# Patient Record
Sex: Female | Born: 1995 | Race: Black or African American | Hispanic: No | Marital: Single | Smoking: Never smoker
Health system: Southern US, Community
[De-identification: ages and names within clinical notes are randomized; demographics above are authoritative.]

## PROBLEM LIST (undated history)

## (undated) DIAGNOSIS — R109 Unspecified abdominal pain: Secondary | ICD-10-CM

## (undated) DIAGNOSIS — G8929 Other chronic pain: Secondary | ICD-10-CM

## (undated) DIAGNOSIS — J9600 Acute respiratory failure, unspecified whether with hypoxia or hypercapnia: Secondary | ICD-10-CM

## (undated) DIAGNOSIS — M25569 Pain in unspecified knee: Secondary | ICD-10-CM

## (undated) HISTORY — PX: NO PAST SURGERIES: SHX2092

## (undated) HISTORY — DX: Acute respiratory failure, unspecified whether with hypoxia or hypercapnia: J96.00

## (undated) HISTORY — PX: OTHER SURGICAL HISTORY: SHX169

---
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
## 2014-01-02 ENCOUNTER — Emergency Department (HOSPITAL_COMMUNITY): Payer: Medicaid Other

## 2014-01-02 ENCOUNTER — Emergency Department (HOSPITAL_COMMUNITY)
Admission: EM | Admit: 2014-01-02 | Discharge: 2014-01-03 | Disposition: A | Discharge status: Home / Self Care | Payer: Medicaid Other | Attending: Emergency Medicine | Admitting: Emergency Medicine

## 2014-01-02 ENCOUNTER — Encounter (HOSPITAL_COMMUNITY): Payer: Self-pay | Admitting: Emergency Medicine

## 2014-01-02 DIAGNOSIS — S99919A Unspecified injury of unspecified ankle, initial encounter: Principal | ICD-10-CM

## 2014-01-02 DIAGNOSIS — X500XXA Overexertion from strenuous movement or load, initial encounter: Secondary | ICD-10-CM | POA: Insufficient documentation

## 2014-01-02 DIAGNOSIS — Y929 Unspecified place or not applicable: Secondary | ICD-10-CM | POA: Insufficient documentation

## 2014-01-02 DIAGNOSIS — S99929A Unspecified injury of unspecified foot, initial encounter: Principal | ICD-10-CM

## 2014-01-02 DIAGNOSIS — F17200 Nicotine dependence, unspecified, uncomplicated: Secondary | ICD-10-CM | POA: Insufficient documentation

## 2014-01-02 DIAGNOSIS — M25461 Effusion, right knee: Secondary | ICD-10-CM

## 2014-01-02 DIAGNOSIS — IMO0002 Reserved for concepts with insufficient information to code with codable children: Secondary | ICD-10-CM | POA: Insufficient documentation

## 2014-01-02 DIAGNOSIS — L309 Dermatitis, unspecified: Secondary | ICD-10-CM

## 2014-01-02 DIAGNOSIS — L259 Unspecified contact dermatitis, unspecified cause: Secondary | ICD-10-CM | POA: Insufficient documentation

## 2014-01-02 DIAGNOSIS — Y9301 Activity, walking, marching and hiking: Secondary | ICD-10-CM | POA: Insufficient documentation

## 2014-01-02 DIAGNOSIS — Z79899 Other long term (current) drug therapy: Secondary | ICD-10-CM | POA: Insufficient documentation

## 2014-01-02 DIAGNOSIS — S8990XA Unspecified injury of unspecified lower leg, initial encounter: Secondary | ICD-10-CM | POA: Insufficient documentation

## 2014-01-02 DIAGNOSIS — J45909 Unspecified asthma, uncomplicated: Secondary | ICD-10-CM | POA: Insufficient documentation

## 2014-01-02 MED ORDER — HYDROCODONE-ACETAMINOPHEN 5-325 MG PO TABS
1.0000 | ORAL_TABLET | Freq: Once | ORAL | Status: AC
  Administered 2014-01-02: 1 via ORAL
  Filled 2014-01-02: qty 1

## 2014-01-02 MED ORDER — IBUPROFEN 800 MG PO TABS
800.0000 mg | ORAL_TABLET | Freq: Once | ORAL | Status: AC
  Administered 2014-01-02: 800 mg via ORAL
  Filled 2014-01-02: qty 1

## 2014-01-02 NOTE — ED Notes (Signed)
PA at bedside.

## 2014-01-02 NOTE — ED Provider Notes (Signed)
CSN: [id number]     Arrival date & time 01/02/14  2313 History   First MD Initiated Contact with Patient 01/02/14 2316     Chief Complaint  Patient presents with  . Knee Pain     (Consider location/radiation/quality/duration/timing/severity/associated sxs/prior Treatment) HPI Comments: Patient is a 18 year old female who presents to the emergency department for complaint of right knee pain. The patient states that she has had problems with her knee" popping out" in the past, but this has not happened for several months. Today approximately 4:30 PM she was walking when the knee" popped out" again. She states that the pain lasted longer this time, and she also noted more swelling time than usual. The patient has not had any injury or trauma to the knee. She has not been evaluated by orthopedics concerning the reason why her knee" pops out". She presents now for evaluation of this problem.  Patient is a 18 y.o. female presenting with knee pain. The history is provided by the patient.  Knee Pain Location:  Knee Associated symptoms: no back pain and no neck pain     Past Medical History  Diagnosis Date  . Asthma    History reviewed. No pertinent past surgical history. Family History  Problem Relation Age of Onset  . Asthma Sister   . Diabetes Maternal Grandmother   . Hypertension Maternal Grandmother   . Diabetes Maternal Grandfather   . Asthma Maternal Grandfather    History  Substance Use Topics  . Smoking status: Current Every Day Smoker  . Smokeless tobacco: Not on file  . Alcohol Use: No   OB History   Grav Para Term Preterm Abortions TAB SAB Ect Mult Living                 Review of Systems  Constitutional: Negative for activity change.       All ROS Neg except as noted in HPI  HENT: Negative for nosebleeds.   Eyes: Negative for photophobia and discharge.  Respiratory: Negative for cough, shortness of breath and wheezing.   Cardiovascular: Negative for chest pain and  palpitations.  Gastrointestinal: Negative for abdominal pain and blood in stool.  Genitourinary: Negative for dysuria, frequency and hematuria.  Musculoskeletal: Positive for arthralgias. Negative for back pain and neck pain.  Skin: Positive for rash.  Neurological: Negative for dizziness, seizures and speech difficulty.  Psychiatric/Behavioral: Negative for hallucinations and confusion.      Allergies  Shrimp  Home Medications   Prior to Admission medications   Medication Sig Start Date End Date Taking? Authorizing Provider  acetaminophen (TYLENOL) 500 MG tablet Take 500 mg by mouth every 6 (six) hours as needed.    Historical Provider, MD  albuterol (PROVENTIL HFA;VENTOLIN HFA) 108 (90 BASE) MCG/ACT inhaler Inhale 2 puffs into the lungs every 6 (six) hours as needed. For shortness of breath     Historical Provider, MD  albuterol (PROVENTIL) (2.5 MG/3ML) 0.083% nebulizer solution Take 2.5 mg by nebulization every 6 (six) hours as needed. For shortness of breath     Historical Provider, MD  beclomethasone (QVAR) 80 MCG/ACT inhaler Inhale 1 puff into the lungs 2 (two) times daily. 07/09/12   Leona SingletonMaria T Thekkekandam, MD  etonogestrel (IMPLANON) 68 MG IMPL implant Inject 1 each into the skin once.    Historical Provider, MD  fluticasone (FLONASE) 50 MCG/ACT nasal spray Place 2 sprays into the nose daily as needed. Alergies    Historical Provider, MD  loratadine (CLARITIN) 10 MG tablet  Take 10 mg by mouth daily.    Historical Provider, MD  ondansetron (ZOFRAN) 4 MG tablet Take 1 tablet (4 mg total) by mouth every 8 (eight) hours as needed for nausea or vomiting. 08/26/13   Laray AngerKathleen M McManus, DO   BP 115/75  Pulse 89  Temp(Src) 98.5 F (36.9 C) (Oral)  Resp 18  Ht 5\' 8"  (1.727 m)  Wt 147 lb (66.679 kg)  BMI 22.36 kg/m2  SpO2 97% Physical Exam  Nursing note and vitals reviewed. Constitutional: She is oriented to person, place, and time. She appears well-developed and well-nourished.   Non-toxic appearance.  HENT:  Head: Normocephalic.  Right Ear: Tympanic membrane and external ear normal.  Left Ear: Tympanic membrane and external ear normal.  Eyes: EOM and lids are normal. Pupils are equal, round, and reactive to light.  Neck: Normal range of motion. Neck supple. Carotid bruit is not present.  Cardiovascular: Normal rate, regular rhythm, normal heart sounds, intact distal pulses and normal pulses.   Pulmonary/Chest: Breath sounds normal. No respiratory distress.  Abdominal: Soft. Bowel sounds are normal. There is no tenderness. There is no guarding.  Musculoskeletal:       Right knee: She exhibits decreased range of motion and effusion. She exhibits no deformity and no erythema. Tenderness found. No patellar tendon tenderness noted.  Patient would not cooperate for examination of laxity of the joint or ligaments.  Lymphadenopathy:       Head (right side): No submandibular adenopathy present.       Head (left side): No submandibular adenopathy present.    She has no cervical adenopathy.  Neurological: She is alert and oriented to person, place, and time. She has normal strength. No cranial nerve deficit or sensory deficit.  Skin: Skin is warm and dry. Rash noted.  Red splotches on the wrists and legs. No red streaking.  Psychiatric: She has a normal mood and affect. Her speech is normal.    ED Course  Procedures (including critical care time) Labs Review Labs Reviewed - No data to display  Imaging Review No results found.   EKG Interpretation None      MDM Patient states she's had problems with her knee popping out from time to time. Today it popped out and the pain was more severe and the swelling was more than usual. The vital signs are well within normal limits. The pulse oximetry is 97% on room air. Within normal limits by my interpretation.   Xray is negative for fx. Small joint effusion noted.  Pt has a splotching rash on the wrist and legs. She  recently changed soaps. Suggested to pt to stop the soap and use triamcinolone cream.  Knee immobilizer and crutches given to the patient. Pt to see orthopedics for possible patella dislocation problem.   Final diagnoses:  None    **I have reviewed nursing notes, vital signs, and all appropriate lab and imaging results for this patient.Kathie Dike*    Aaren Atallah M Daley Gosse, PA-C 01/03/14 [phone]

## 2014-01-02 NOTE — ED Notes (Signed)
Patient states her right knee "popped out" today and she put it back in; states this has happened before.  Patient also has a rash; states she recently changed soap.  Patient ambulatory.

## 2014-01-03 MED ORDER — IBUPROFEN 800 MG PO TABS: 800.0000 mg | ORAL_TABLET | Freq: Three times a day (TID) | ORAL | Status: DC

## 2014-01-03 MED ORDER — TRIAMCINOLONE ACETONIDE 0.1 % EX CREA: 1.0000 "application " | TOPICAL_CREAM | Freq: Two times a day (BID) | CUTANEOUS | Status: DC

## 2014-01-03 MED ORDER — TRAMADOL HCL 50 MG PO TABS: ORAL_TABLET | ORAL | Status: DC

## 2014-01-03 NOTE — Discharge Instructions (Signed)
Your xray is negative for fracture, but suggest fluid in the joint (effusion). You may have a weak muscle on the side of your knee that is no holding your patella steady. It is important that you use the knee immobilizer until seen by orthopedics. Please apply ice for the next 3 days. Use ibuprofen daily with food. Use ultram for pain. This may cause drowsiness, use with caution. Please see the orthopedic MD listed above, or the orthopedic MD of your choice as soon as possible. Knee Effusion  Knee effusion means you have fluid in your knee. The knee may be more difficult to bend and move. HOME CARE  Use crutches or a brace as told by your doctor.  Put ice on the injured area.  Put ice in a plastic bag.  Place a towel between your skin and the bag.  Leave the ice on for 15-20 minutes, 03-04 times a day.  Raise (elevate) your knee as much as possible.  Only take medicine as told by your doctor.  You may need to do strengthening exercises. Ask your doctor.  Continue with your normal diet and activities as told by your doctor. GET HELP RIGHT AWAY IF:  You have more puffiness (swelling) in your knee.  You see redness, puffiness, or have more pain in your knee.  You have a temperature by mouth above 102 F (38.9 C).  You get a rash.  You have trouble breathing.  You have a reaction to any medicine you are taking.  You have a lot of pain when you move your knee. MAKE SURE YOU:  Understand these instructions.  Will watch your condition.  Will get help right away if you are not doing well or get worse. Document Released: 09/28/2010 Document Revised: 11/18/2011 Document Reviewed: 09/28/2010 [facility] Patient Information 2014 [employer], [place].

## 2014-01-03 NOTE — ED Provider Notes (Signed)
Medical screening examination/treatment/procedure(s) were performed by non-physician practitioner and as supervising physician I was immediately available for consultation/collaboration.    Vida RollerBrian D Shantel Helwig, MD 01/03/14 [phone]

## 2014-04-09 ENCOUNTER — Encounter: Payer: Self-pay | Admitting: *Deleted

## 2014-05-03 ENCOUNTER — Encounter: Payer: Self-pay | Admitting: Advanced Practice Midwife

## 2014-05-03 ENCOUNTER — Encounter: Payer: Self-pay | Admitting: *Deleted

## 2014-12-19 ENCOUNTER — Emergency Department (HOSPITAL_COMMUNITY): Payer: Self-pay

## 2014-12-19 ENCOUNTER — Emergency Department (HOSPITAL_COMMUNITY)
Admission: EM | Admit: 2014-12-19 | Discharge: 2014-12-19 | Disposition: A | Discharge status: Home / Self Care | Payer: Self-pay | Attending: Emergency Medicine | Admitting: Emergency Medicine

## 2014-12-19 ENCOUNTER — Encounter (HOSPITAL_COMMUNITY): Payer: Self-pay | Admitting: Emergency Medicine

## 2014-12-19 DIAGNOSIS — J45909 Unspecified asthma, uncomplicated: Secondary | ICD-10-CM | POA: Insufficient documentation

## 2014-12-19 DIAGNOSIS — Z79899 Other long term (current) drug therapy: Secondary | ICD-10-CM | POA: Insufficient documentation

## 2014-12-19 DIAGNOSIS — R109 Unspecified abdominal pain: Secondary | ICD-10-CM

## 2014-12-19 DIAGNOSIS — Z3202 Encounter for pregnancy test, result negative: Secondary | ICD-10-CM | POA: Insufficient documentation

## 2014-12-19 DIAGNOSIS — Z7952 Long term (current) use of systemic steroids: Secondary | ICD-10-CM | POA: Insufficient documentation

## 2014-12-19 DIAGNOSIS — G8929 Other chronic pain: Secondary | ICD-10-CM | POA: Insufficient documentation

## 2014-12-19 DIAGNOSIS — N390 Urinary tract infection, site not specified: Secondary | ICD-10-CM | POA: Insufficient documentation

## 2014-12-19 HISTORY — DX: Other chronic pain: G89.29

## 2014-12-19 HISTORY — DX: Pain in unspecified knee: M25.569

## 2014-12-19 HISTORY — DX: Unspecified abdominal pain: R10.9

## 2014-12-19 LAB — CBC WITH DIFFERENTIAL/PLATELET
Basophils Absolute: 0 10*3/uL (ref 0.0–0.1)
Basophils Relative: 0 % (ref 0–1)
EOS PCT: 3 % (ref 0–5)
Eosinophils Absolute: 0.2 10*3/uL (ref 0.0–0.7)
HCT: 41.4 % (ref 36.0–46.0)
HEMOGLOBIN: 13.5 g/dL (ref 12.0–15.0)
LYMPHS PCT: 25 % (ref 12–46)
Lymphs Abs: 1.4 10*3/uL (ref 0.7–4.0)
MCH: 29.5 pg (ref 26.0–34.0)
MCHC: 32.6 g/dL (ref 30.0–36.0)
MCV: 90.6 fL (ref 78.0–100.0)
Monocytes Absolute: 0.4 10*3/uL (ref 0.1–1.0)
Monocytes Relative: 7 % (ref 3–12)
NEUTROS ABS: 3.8 10*3/uL (ref 1.7–7.7)
Neutrophils Relative %: 65 % (ref 43–77)
PLATELETS: 396 10*3/uL (ref 150–400)
RBC: 4.57 MIL/uL (ref 3.87–5.11)
RDW: 12.3 % (ref 11.5–15.5)
WBC: 5.8 10*3/uL (ref 4.0–10.5)

## 2014-12-19 LAB — COMPREHENSIVE METABOLIC PANEL
ALT: 14 U/L (ref 0–35)
ANION GAP: 10 (ref 5–15)
AST: 20 U/L (ref 0–37)
Albumin: 4 g/dL (ref 3.5–5.2)
Alkaline Phosphatase: 61 U/L (ref 39–117)
BUN: 11 mg/dL (ref 6–23)
CO2: 23 mmol/L (ref 19–32)
CREATININE: 0.77 mg/dL (ref 0.50–1.10)
Calcium: 9.5 mg/dL (ref 8.4–10.5)
Chloride: 106 mmol/L (ref 96–112)
GLUCOSE: 103 mg/dL — AB (ref 70–99)
Potassium: 4.1 mmol/L (ref 3.5–5.1)
SODIUM: 139 mmol/L (ref 135–145)
TOTAL PROTEIN: 8.1 g/dL (ref 6.0–8.3)
Total Bilirubin: 0.8 mg/dL (ref 0.3–1.2)

## 2014-12-19 LAB — URINALYSIS, ROUTINE W REFLEX MICROSCOPIC
Bilirubin Urine: NEGATIVE
Glucose, UA: NEGATIVE mg/dL
KETONES UR: NEGATIVE mg/dL
Leukocytes, UA: NEGATIVE
Nitrite: POSITIVE — AB
PH: 6 (ref 5.0–8.0)
Protein, ur: NEGATIVE mg/dL
Specific Gravity, Urine: >1.03 — ABNORMAL HIGH (ref 1.005–1.030)
Urobilinogen, UA: 0.2 mg/dL (ref 0.0–1.0)

## 2014-12-19 LAB — LIPASE, BLOOD: LIPASE: 16 U/L (ref 11–59)

## 2014-12-19 LAB — URINE MICROSCOPIC-ADD ON

## 2014-12-19 LAB — PREGNANCY, URINE: Preg Test, Ur: NEGATIVE

## 2014-12-19 MED ORDER — FAMOTIDINE 20 MG PO TABS
40.0000 mg | ORAL_TABLET | Freq: Once | ORAL | Status: AC
  Administered 2014-12-19: 40 mg via ORAL
  Filled 2014-12-19: qty 2

## 2014-12-19 MED ORDER — GI COCKTAIL ~~LOC~~
30.0000 mL | Freq: Once | ORAL | Status: AC
  Administered 2014-12-19: 30 mL via ORAL
  Filled 2014-12-19: qty 30

## 2014-12-19 MED ORDER — CEPHALEXIN 500 MG PO CAPS
500.0000 mg | ORAL_CAPSULE | Freq: Once | ORAL | Status: AC
  Administered 2014-12-19: 500 mg via ORAL
  Filled 2014-12-19: qty 1

## 2014-12-19 MED ORDER — CEPHALEXIN 500 MG PO CAPS: 500.0000 mg | ORAL_CAPSULE | Freq: Four times a day (QID) | ORAL | Status: DC

## 2014-12-19 NOTE — ED Provider Notes (Signed)
CSN: [id number]     Arrival date & time 12/19/14  1603 History   First MD Initiated Contact with Patient 12/19/14 1619     Chief Complaint  Patient presents with  . Abdominal Pain      HPI  Pt was seen at 1630.  Per pt, c/o gradual onset and persistence of constant upper abd "pain" for the past 2 to 3 months.  Has been associated with multiple intermittent episodes of N/V, with "blood in it sometimes." Describes the abd pain as "stabbing," "shooting," and "aching." States she is also unsure if she is pregnant and is requesting a pregnancy test. Pt states she has told her PMD regarding her symptoms and "he just said it was from coughing or maybe it was my stomach." Pt states she has not followed up "because I don't have insurance." Denies diarrhea, no fevers, no back pain, no rash, no CP/SOB, no black or blood in stools, no coffee-ground emesis.    Past Medical History  Diagnosis Date  . Asthma   . Respiratory failure, acute 06/2012    bipap only, not intubated  . Chronic abdominal pain   . Chronic knee pain    History reviewed. No pertinent past surgical history.   Family History  Problem Relation Age of Onset  . Asthma Sister   . Diabetes Maternal Grandmother   . Hypertension Maternal Grandmother   . Diabetes Maternal Grandfather   . Asthma Maternal Grandfather    History  Substance Use Topics  . Smoking status: Never Smoker   . Smokeless tobacco: Not on file  . Alcohol Use: No    Review of Systems ROS: Statement: All systems negative except as marked or noted in the HPI; Constitutional: Negative for fever and chills. ; ; Eyes: Negative for eye pain, redness and discharge. ; ; ENMT: Negative for ear pain, hoarseness, nasal congestion, sinus pressure and sore throat. ; ; Cardiovascular: Negative for chest pain, palpitations, diaphoresis, dyspnea and peripheral edema. ; ; Respiratory: Negative for cough, wheezing and stridor. ; ; Gastrointestinal: +N/V, abd pain. Negative for  diarrhea, blood in stool, jaundice and rectal bleeding. . ; ; Genitourinary: Negative for dysuria, flank pain and hematuria. ; ; Musculoskeletal: Negative for back pain and neck pain. Negative for swelling and trauma.; ; Skin: Negative for pruritus, rash, abrasions, blisters, bruising and skin lesion.; ; Neuro: Negative for headache, lightheadedness and neck stiffness. Negative for weakness, altered level of consciousness , altered mental status, extremity weakness, paresthesias, involuntary movement, seizure and syncope.     Allergies  Shrimp  Home Medications   Prior to Admission medications   Medication Sig Start Date End Date Taking? Authorizing Provider  acetaminophen (TYLENOL) 500 MG tablet Take 500 mg by mouth every 6 (six) hours as needed.    Historical Provider, MD  albuterol (PROVENTIL HFA;VENTOLIN HFA) 108 (90 BASE) MCG/ACT inhaler Inhale 2 puffs into the lungs every 6 (six) hours as needed. For shortness of breath     Historical Provider, MD  albuterol (PROVENTIL) (2.5 MG/3ML) 0.083% nebulizer solution Take 2.5 mg by nebulization every 6 (six) hours as needed. For shortness of breath     Historical Provider, MD  beclomethasone (QVAR) 80 MCG/ACT inhaler Inhale 1 puff into the lungs 2 (two) times daily. 07/09/12   Leona SingletonMaria T Thekkekandam, MD  etonogestrel (IMPLANON) 68 MG IMPL implant Inject 1 each into the skin once.    Historical Provider, MD  fluticasone (FLONASE) 50 MCG/ACT nasal spray Place 2 sprays into the  nose daily as needed. Alergies    Historical Provider, MD  ibuprofen (ADVIL,MOTRIN) 800 MG tablet Take 1 tablet (800 mg total) by mouth 3 (three) times daily. 01/03/14   Ivery Quale, PA-C  loratadine (CLARITIN) 10 MG tablet Take 10 mg by mouth daily.    Historical Provider, MD  ondansetron (ZOFRAN) 4 MG tablet Take 1 tablet (4 mg total) by mouth every 8 (eight) hours as needed for nausea or vomiting. 08/26/13   Samuel Jester, DO  traMADol Janean Sark) 50 MG tablet 1 or 2 q6h prn  pain, take with food 01/03/14   Ivery Quale, PA-C  triamcinolone cream (KENALOG) 0.1 % Apply 1 application topically 2 (two) times daily. Please apply to affected area 3 times daily. 01/03/14   Ivery Quale, PA-C   BP 145/89 mmHg  Pulse 92  Temp(Src) 98.9 F (37.2 C) (Oral)  Resp 18  Ht  (1.727 m)  Wt 134 lb (60.782 kg)  BMI 20.38 kg/m2  SpO2 100% Physical Exam  1635: Physical examination:  Nursing notes reviewed; Vital signs and O2 SAT reviewed;  Constitutional: Well developed, Well nourished, Well hydrated, In no acute distress; Head:  Normocephalic, atraumatic; Eyes: EOMI, PERRL, No scleral icterus; ENMT: Mouth and pharynx normal, Mucous membranes moist; Neck: Supple, Full range of motion, No lymphadenopathy; Cardiovascular: Regular rate and rhythm, No murmur, rub, or gallop; Respiratory: Breath sounds clear & equal bilaterally, No rales, rhonchi, wheezes.  Speaking full sentences with ease, Normal respiratory effort/excursion; Chest: Nontender, Movement normal; Abdomen: Soft, +mid-epigastric and LUQ tenderness to palp. No rebound or guarding. Nondistended, Normal bowel sounds; Genitourinary: No CVA tenderness; Extremities: Pulses normal, No tenderness, No edema, No calf edema or asymmetry.; Neuro: AA&Ox3, Major CN grossly intact.  Speech clear. No gross focal motor or sensory deficits in extremities.; Skin: Color normal, Warm, Dry.   ED Course  Procedures     EKG Interpretation None      MDM  MDM Reviewed: previous chart, nursing note and vitals Reviewed previous: labs Interpretation: labs and x-ray     Results for orders placed or performed during the hospital encounter of 12/19/14  Urinalysis, Routine w reflex microscopic  Result Value Ref Range   Color, Urine YELLOW YELLOW   APPearance CLEAR CLEAR   Specific Gravity, Urine >1.030 (H) 1.005 - 1.030   pH 6.0 5.0 - 8.0   Glucose, UA NEGATIVE NEGATIVE mg/dL   Hgb urine dipstick MODERATE (A) NEGATIVE   Bilirubin  Urine NEGATIVE NEGATIVE   Ketones, ur NEGATIVE NEGATIVE mg/dL   Protein, ur NEGATIVE NEGATIVE mg/dL   Urobilinogen, UA 0.2 0.0 - 1.0 mg/dL   Nitrite POSITIVE (A) NEGATIVE   Leukocytes, UA NEGATIVE NEGATIVE  Pregnancy, urine  Result Value Ref Range   Preg Test, Ur NEGATIVE NEGATIVE  Comprehensive metabolic panel  Result Value Ref Range   Sodium 139 135 - 145 mmol/L   Potassium 4.1 3.5 - 5.1 mmol/L   Chloride 106 96 - 112 mmol/L   CO2 23 19 - 32 mmol/L   Glucose, Bld 103 (H) 70 - 99 mg/dL   BUN 11 6 - 23 mg/dL   Creatinine, Ser 1.61 0.50 - 1.10 mg/dL   Calcium 9.5 8.4 - 09.6 mg/dL   Total Protein 8.1 6.0 - 8.3 g/dL   Albumin 4.0 3.5 - 5.2 g/dL   AST 20 0 - 37 U/L   ALT 14 0 - 35 U/L   Alkaline Phosphatase 61 39 - 117 U/L   Total Bilirubin 0.8 0.3 -  1.2 mg/dL   GFR calc non Af Amer >90 >90 mL/min   GFR calc Af Amer >90 >90 mL/min   Anion gap 10 5 - 15  Lipase, blood  Result Value Ref Range   Lipase 16 11 - 59 U/L  CBC with Differential  Result Value Ref Range   WBC 5.8 4.0 - 10.5 K/uL   RBC 4.57 3.87 - 5.11 MIL/uL   Hemoglobin 13.5 12.0 - 15.0 g/dL   HCT 16.1 09.6 - 04.5 %   MCV 90.6 78.0 - 100.0 fL   MCH 29.5 26.0 - 34.0 pg   MCHC 32.6 30.0 - 36.0 g/dL   RDW 40.9 81.1 - 91.4 %   Platelets 396 150 - 400 K/uL   Neutrophils Relative % 65 43 - 77 %   Neutro Abs 3.8 1.7 - 7.7 K/uL   Lymphocytes Relative 25 12 - 46 %   Lymphs Abs 1.4 0.7 - 4.0 K/uL   Monocytes Relative 7 3 - 12 %   Monocytes Absolute 0.4 0.1 - 1.0 K/uL   Eosinophils Relative 3 0 - 5 %   Eosinophils Absolute 0.2 0.0 - 0.7 K/uL   Basophils Relative 0 0 - 1 %   Basophils Absolute 0.0 0.0 - 0.1 K/uL  Urine microscopic-add on  Result Value Ref Range   Squamous Epithelial / LPF RARE RARE   WBC, UA 21-50 <3 WBC/hpf   RBC / HPF 3-6 <3 RBC/hpf   Bacteria, UA MANY (A) RARE   Dg Chest 2 View 12/19/2014   CLINICAL DATA:  Initial encounter for 2 week history of worsening shortness of breath and fever.  EXAM:  CHEST  2 VIEW  COMPARISON:  08/26/2013.  FINDINGS: The heart size and mediastinal contours are within normal limits. Both lungs are clear. The visualized skeletal structures are unremarkable.  IMPRESSION: Normal exam.   Electronically Signed   By: Kennith Center M.D.   On: 12/19/2014 16:46   Dg Abd 2 Views 12/19/2014   CLINICAL DATA:  Left lower quadrant abdominal pain  EXAM: ABDOMEN - 2 VIEW  COMPARISON:  None.  FINDINGS: The bowel gas pattern is normal. There is no evidence of free air. No radio-opaque calculi or other significant radiographic abnormality is seen.  IMPRESSION: Negative.   Electronically Signed   By: Christiana Pellant M.D.   On: 12/19/2014 19:28    2030:  Workup reassuring. Will tx for UTI. Pt states she wants to go home now. Dx and testing d/w pt.  Questions answered.  Verb understanding, agreeable to d/c home with outpt f/u.   Samuel Jester, DO 12/22/14 1650

## 2014-12-19 NOTE — Discharge Instructions (Signed)
°Emergency Department Resource Guide °1) Find a Doctor and Pay Out of Pocket °Although you won't have to find out who is covered by your insurance plan, it is a good idea to ask around and get recommendations. You will then need to call the office and see if the doctor you have chosen will accept you as a new patient and what types of options they offer for patients who are self-pay. Some doctors offer discounts or will set up payment plans for their patients who do not have insurance, but you will need to ask so you aren't surprised when you get to your appointment. ° °2) Contact Your Local Health Department °Not all health departments have doctors that can see patients for sick visits, but many do, so it is worth a call to see if yours does. If you don't know where your local health department is, you can check in your phone book. The CDC also has a tool to help you locate your state's health department, and many state websites also have listings of all of their local health departments. ° °3) Find a Walk-in Clinic °If your illness is not likely to be very severe or complicated, you may want to try a walk in clinic. These are popping up all over the country in pharmacies, drugstores, and shopping centers. They're usually staffed by nurse practitioners or physician assistants that have been trained to treat common illnesses and complaints. They're usually fairly quick and inexpensive. However, if you have serious medical issues or chronic medical problems, these are probably not your best option. ° °No Primary Care Doctor: °- Call Health Connect at  832-8000 - they can help you locate a primary care doctor that  accepts your insurance, provides certain services, etc. °- Physician Referral Service- 1-800-533-3463 ° °Chronic Pain Problems: °Organization         Address  Phone   Notes  °[facility]  ([phone]) [phone] Patients need to be referred by their primary care doctor.  ° °Medication  Assistance: °Organization         Address  Phone   Notes  °[place] [facility] [address] °[place], [place] [address] (336) 641-8030 --Must be a resident of [place] °-- Must have NO insurance coverage whatsoever (no Medicaid/ Medicare, etc.) °-- The pt. MUST have a primary care doctor that directs their care regularly and follows them in the community °  °[facility]  (866) 331-1348   °[employer]  (888) 892-1162   ° °Agencies that provide inexpensive medical care: °Organization         Address  Phone   Notes  °[facility]  (336) 832-8035   °[facility]    (336) 832-7272   °[facility] [address] °[place], [place] [address] (336) 832-4777   °[facility] [address], °[place] (336) 271-4999   °[facility]    (336) 373-0678   °[facility]    (336) 272-1050   °[facility] ° [address], [place] Phone:  (336) 832-4444, Fax:  (336) 832-4440 Hours of Operation:  9 am - 6 pm, M-F.  Also accepts Medicaid/Medicare and self-pay.  °[facility] ° [address], [place] Phone: (336) [phone], Fax: (336) [phone]. Hours of Operation:  8:30 am - 5:30 pm, M-F.  Also accepts Medicaid and self-pay.  °[facility] [place] [address], [place] Phone: (336) 878-6027   °[facility] [address], [place], [place] (336)723-1848, Ext. 123 Mondays & Thursdays: 7-9 AM.  First 15 patients are seen on a first come, first serve basis. °  ° °Medicaid-accepting [place] Providers: ° °Organization         Address  Phone   Notes  °[facility] [address], [place] (336) 641-2100 Also accepts self-pay patients.  °[facility] [address], [place] ° (336) 856-9996   °[facility] [address], [place]  (336) 288-8857   °[facility] [address] [place] [address], [place] (336) 299-7000   °Veita Bland [address], [place]  ° (336) 373-1557 Only accepts [place] Access Medicaid patients after they have their name applied to their card.  ° °Self-Pay (no insurance) in [place]: ° °Organization         Address  Phone   Notes  °Sickle Cell Patients, [facility] [address], [place] (336) 832-1970   °[facility] [address], [place] (336) 832-4400   °[facility] ° [address], [place] (336) 992-4800   °[facility]/Dr. Osei-Bonsu ° [address] [place] [address], [place] or [address], [place] (336) [phone] Phone number for both [place] and [place] locations is the same.  °[facility] [address], [place] (336) 299-0000   °[facility] [address] [place] [address], [place] or [address] (336) 852-7530 °(336) 878-2260   °[facility] [address], [place] (336) 350-1642, phone; (336) 294-5005, fax Sees patients 1st and 3rd Saturday of every month.  Must not qualify for public or private insurance (i.e. Medicaid, Medicare, [place] Health Choice, Veterans' Benefits) • Household income should be no more than 200% of the poverty level •The clinic cannot treat you if you are pregnant or think you are pregnant • Sexually transmitted diseases are not treated at the clinic.  ° ° °Dental Care: °Organization         Address  Phone  Notes  °[place] [facility] [address], [place] (336) 641-6152 Accepts children up to age 21 who are enrolled in Medicaid or [place] Health Choice; pregnant women with a Medicaid card; and children who have applied for Medicaid or [place] Health Choice, but were declined, whose parents can pay a reduced fee at time of service.  °[place]  [facility] [place]  [address], [place] (336) 641-7733 Accepts children up to age 21 who are enrolled in Medicaid or [place] Health Choice; pregnant women with a Medicaid card; and children who have applied for Medicaid or [place] Health Choice, but were declined, whose parents can pay a reduced fee at time of service.  °[facility] ° [address], [place] (336) 641-4533 Patients are seen by appointment only. Walk-ins are not accepted. [facility] will see patients 18 years of age and older. °Monday - Tuesday (8am-5pm) °Most Wednesdays (8:30-5pm) °$30 per visit, cash only  °[facility] ° [address], [place] (336) 641-4533 Patients are seen by appointment only. Walk-ins are not accepted. [facility] will see patients 18 years of age and older. °One   Wednesday Evening (Monthly: Volunteer Based).  $30 per visit, cash only  °[facility]  (919) [phone] for adults; Children under age 4, call Graduate Pediatric Dentistry at ([phone]) [phone]. Children aged 4-14, please call ([phone]) [phone] to request a pediatric application. ° Dental services are provided in all areas of dental care including fillings, crowns and bridges, complete and partial dentures, implants, gum treatment, root canals, and extractions. Preventive care is also provided. Treatment is provided to both adults and children. °Patients are selected via a lottery and there is often a waiting list. °  °[facility] [address], °[place] ° (336) 763-8833 [email or web address] °  °[facility] [address], [place], [place] (336)723-1848, Ext. 123 Second and Fourth Thursday of each month, opens at 6:30 AM; Clinic ends at 9 AM.  Patients are seen on a first-come first-served basis, and a limited number are seen during each clinic.  ° °[facility] ° [address], [place], [place] (336) 723-7904    Eligibility Requirements °You must have lived in [place], [place], or [place] counties for at least the last three months. °  You cannot be eligible for state or federal sponsored healthcare insurance, including Veterans Administration, Medicaid, or Medicare. °  You generally cannot be eligible for healthcare insurance through your employer.  °  How to apply: °Eligibility screenings are held every Tuesday and Wednesday afternoon from 1:00 pm until 4:00 pm. You do not need an appointment for the interview!  °[facility] [address], [place], [place] 336-631-2330   °[facility]  336-342-8273   °[facility]  336-703-3100   °[facility]  336-570-6415   ° °Behavioral Health Resources in the Community: °Intensive Outpatient Programs °Organization         Address  Phone  Notes  °[facility] [address], [place], [place] 336-878-6098   °[facility] [address], [place], [place] 336-832-9800   °[facility] [address], [place], [place] ° 336-882-2125   °[facility] [address],  °[place], [place] 1-800-853-5163 or 336-641-4981   °Substance Abuse Resources °Organization         Address  Phone  Notes  °[facility]  336-882-2125   °[facility]  336-784-9470   °[facility]  336-285-9073   °[facility]  336-845-3988   °Residential & Outpatient Substance Abuse Program  1-800-659-3381   °Psychological Services °Organization         Address  Phone  Notes  °[facility]  336- 832-9600   °[facility]  336- 378-7881   °[facility] [address], [place] 1-800-853-5163 or 336-641-4981   ° °Mobile Crisis Teams °Organization         Address  Phone  Notes  °[facility], Mobile Crisis Care Unit  1-877-626-1772   °Assertive °[facility] ° [address]  [place], [place] 336-834-9664   °Sharon DeEsch [address] °[place] 336-554-5454   ° °Self-Help/Support Groups °Organization         Address  Phone             Notes  °[facility] of  - variety of support groups  336- 373-1402 Call for more information  °Narcotics Anonymous (NA), [facility] [address], °[place]  2 meetings at this location  ° °  Residential Treatment Programs Organization         Address  Phone  Notes  [facility] [address],    [place]  1-610-960-45401-(210) 122-0698   [facility]  [address], [place] [phone], [place], [place] [phone]) [phone]   [facility] [address], [place] [phone] Admissions: 8am-3pm M-F  [facility] [address]    [place], [place] [phone]([phone]) [phone]   [facility] [address], [place], [place] [phone]   [facility] [address]  [place], [place] [phone]   [facility] Intensive Outpatient [address], [place], [place] [phone]   [facility] [address]  [place], [place] [phone] or [phone]) [phone]   [facility] [address] [place], [place] [phone] Accepts Medicaid  [facility] [address]  [place] [phone] Substance Abuse/Addiction Treatment   [facility]         Address  Phone  Notes  [facility]  (480)595-5953(888) [phone]   Angie FavaJulie Brannon, PhD [address], [address] [place], [place]   [phone](336) [phone] or [phone](336) [phone]   [facility]   [address] [place], [place] [phone](336) [phone]   [facility] [address], [place], [place] [phone](336) [phone] Insurance/Medicaid/sponsorship through [facility] [address]                                    [place], [place] [phone](336) [phone] Therapy/tele-psych/case    [facility] [address]   , [place] [phone](336) [phone]    Dr. Lolly MustacheArfeen  [phone]   [facility]  [employer] [facility] 1) [address],  2) [address], [place] 3)  [address], [place] [phone](336) [phone](336) [phone]  [phone](336) [phone]   [facility] Child Abuse Hotline [phone](336) [phone] or [phone](336) [phone] (After Hours)      Eat a bland diet, avoiding greasy, fatty, fried foods, as well as spicy and acidic foods or beverages.  Avoid eating within the hour or 2 before going to bed or laying down.  Also avoid teas, colas, coffee, chocolate, pepermint and spearment.  Take over the counter pepcid, one tablet by mouth twice a day, for the next 3 to 4 weeks.  May also take over the counter maalox/mylanta, as directed on packaging, as needed for discomfort.  Take the prescription as directed.  Call your regular medical doctor tomorrow to schedule a follow up appointment in the next 2 days. Call the GI doctor tomorrow to schedule a follow up appointment within the next week.  Return to the Emergency Department immediately if worsening.

## 2014-12-19 NOTE — ED Notes (Signed)
Collected urine sample per orders.

## 2014-12-19 NOTE — ED Notes (Addendum)
PT states increase in use of her asthma inhaler with fever, sob and nose bleeds x2 weeks. PT also c/o LUQ abdominal pain but denies any n/v/d.

## 2014-12-19 NOTE — ED Notes (Addendum)
Pt presents to ED complaining of an asthma exacerbation, LLQ abdominal pain, nosebleeds, and vomiting blood.  She states that she has vomited three times today but only once was bloody.  Last time vomiting was around 12:30 this afternoon.  She has a birth control implant so she is unsure of her last period or of when her next period is due, and she states that it is possible that she is pregnant.  She states that her asthma is currently not bothering her.  Her abdominal pain is rated as 7/10 and described as shooting pain that has been occurring for "a month or two." She has not taken any pain medication today. Her last meal was last night at about 12 midnight. Pt's abdomen appears to be symmetrical and flat, audible bowel sounds in all four quadrants.  Pt withdraws from palpation to left upper and lower quadrants.

## 2015-02-09 ENCOUNTER — Other Ambulatory Visit (HOSPITAL_COMMUNITY): Payer: Self-pay | Admitting: Nurse Practitioner

## 2015-02-09 DIAGNOSIS — N644 Mastodynia: Secondary | ICD-10-CM

## 2015-02-21 ENCOUNTER — Other Ambulatory Visit (HOSPITAL_COMMUNITY): Payer: Self-pay

## 2015-02-21 ENCOUNTER — Ambulatory Visit (HOSPITAL_COMMUNITY): Admission: RE | Admit: 2015-02-21 | Payer: Self-pay | Source: Ambulatory Visit

## 2015-06-02 ENCOUNTER — Emergency Department (HOSPITAL_COMMUNITY)
Admission: EM | Admit: 2015-06-02 | Discharge: 2015-06-02 | Disposition: A | Discharge status: Home / Self Care | Payer: Self-pay | Attending: Emergency Medicine | Admitting: Emergency Medicine

## 2015-06-02 ENCOUNTER — Encounter (HOSPITAL_COMMUNITY): Payer: Self-pay | Admitting: *Deleted

## 2015-06-02 DIAGNOSIS — Z792 Long term (current) use of antibiotics: Secondary | ICD-10-CM | POA: Insufficient documentation

## 2015-06-02 DIAGNOSIS — J45901 Unspecified asthma with (acute) exacerbation: Secondary | ICD-10-CM | POA: Insufficient documentation

## 2015-06-02 DIAGNOSIS — Z79899 Other long term (current) drug therapy: Secondary | ICD-10-CM | POA: Insufficient documentation

## 2015-06-02 DIAGNOSIS — G8929 Other chronic pain: Secondary | ICD-10-CM | POA: Insufficient documentation

## 2015-06-02 DIAGNOSIS — Z7951 Long term (current) use of inhaled steroids: Secondary | ICD-10-CM | POA: Insufficient documentation

## 2015-06-02 MED ORDER — PREDNISONE 50 MG PO TABS
60.0000 mg | ORAL_TABLET | Freq: Once | ORAL | Status: AC
  Administered 2015-06-02: 14:00:00 60 mg via ORAL
  Filled 2015-06-02 (×2): qty 1

## 2015-06-02 MED ORDER — ALBUTEROL SULFATE (2.5 MG/3ML) 0.083% IN NEBU: 2.5000 mg | INHALATION_SOLUTION | Freq: Four times a day (QID) | RESPIRATORY_TRACT | Status: DC | PRN

## 2015-06-02 MED ORDER — IPRATROPIUM-ALBUTEROL 0.5-2.5 (3) MG/3ML IN SOLN
3.0000 mL | Freq: Once | RESPIRATORY_TRACT | Status: AC
  Administered 2015-06-02: 3 mL via RESPIRATORY_TRACT
  Filled 2015-06-02: qty 3

## 2015-06-02 MED ORDER — ALBUTEROL SULFATE (2.5 MG/3ML) 0.083% IN NEBU
2.5000 mg | INHALATION_SOLUTION | Freq: Once | RESPIRATORY_TRACT | Status: AC
  Administered 2015-06-02: 2.5 mg via RESPIRATORY_TRACT
  Filled 2015-06-02: qty 3

## 2015-06-02 MED ORDER — PREDNISONE 20 MG PO TABS: 40.0000 mg | ORAL_TABLET | Freq: Every day | ORAL | Status: DC

## 2015-06-02 NOTE — Discharge Instructions (Signed)

## 2015-06-02 NOTE — ED Notes (Signed)
States she has had breathing problems for the past several days. Lungs clear on arrival. Non productive cough

## 2015-06-04 NOTE — ED Provider Notes (Signed)
CSN: [id number]     Arrival date & time 06/02/15  1253 History   First MD Initiated Contact with Patient 06/02/15 1308     Chief Complaint  Patient presents with  . Cough     (Consider location/radiation/quality/duration/timing/severity/associated sxs/prior Treatment) HPI   Sharon Nash is a 19 y.o. female who presents to the Emergency Department complaining of cough, wheezing and congestion for several days.  She reports her son also having similar symptoms at here to be evaluated.  She states her wheezing is worse with exertion and with coughing and  Shortness of breath with excessive coughing.  States cough is non-productive.  She has used her albuterol nebs at home without relief.  She denies chest pain, fever, bloody sputum, fever, chills, abd pain and vomiting.     Past Medical History  Diagnosis Date  . Asthma   . Respiratory failure, acute 06/2012    bipap only, not intubated  . Chronic abdominal pain   . Chronic knee pain    History reviewed. No pertinent past surgical history. Family History  Problem Relation Age of Onset  . Asthma Sister   . Diabetes Maternal Grandmother   . Hypertension Maternal Grandmother   . Diabetes Maternal Grandfather   . Asthma Maternal Grandfather    Social History  Substance Use Topics  . Smoking status: Never Smoker   . Smokeless tobacco: None  . Alcohol Use: No   OB History    Gravida Para Term Preterm AB TAB SAB Ectopic Multiple Living            1     Review of Systems  Constitutional: Negative for fever, chills and appetite change.  HENT: Positive for congestion. Negative for sore throat and trouble swallowing.   Respiratory: Positive for cough, chest tightness, shortness of breath and wheezing. Negative for stridor.   Cardiovascular: Negative for chest pain.  Gastrointestinal: Negative for nausea, vomiting and abdominal pain.  Musculoskeletal: Negative for myalgias, arthralgias, neck pain and neck stiffness.  Skin:  Negative for rash.  Neurological: Negative for dizziness, weakness and numbness.  Hematological: Negative for adenopathy.  All other systems reviewed and are negative.     Allergies  Shrimp  Home Medications   Prior to Admission medications   Medication Sig Start Date End Date Taking? Authorizing Takasha Vetere  albuterol (PROVENTIL) (2.5 MG/3ML) 0.083% nebulizer solution Take 3 mLs (2.5 mg total) by nebulization every 6 (six) hours as needed for wheezing or shortness of breath. 06/02/15   Tammy Triplett, PA-C  beclomethasone (QVAR) 80 MCG/ACT inhaler Inhale 1 puff into the lungs 2 (two) times daily. 07/09/12   Leona Singleton, MD  cephALEXin (KEFLEX) 500 MG capsule Take 1 capsule (500 mg total) by mouth 4 (four) times daily. 12/19/14   Samuel Jester, DO  etonogestrel (IMPLANON) 68 MG IMPL implant Inject 1 each into the skin once.    Historical Rayana Geurin, MD  fluticasone (FLONASE) 50 MCG/ACT nasal spray Place 2 sprays into the nose daily as needed. Alergies    Historical Rosalva Neary, MD  ibuprofen (ADVIL,MOTRIN) 800 MG tablet Take 1 tablet (800 mg total) by mouth 3 (three) times daily. Patient not taking: Reported on 12/19/2014 01/03/14   Ivery Quale, PA-C  loratadine (CLARITIN) 10 MG tablet Take 10 mg by mouth daily.    Historical Odai Wimmer, MD  Multiple Vitamins-Minerals (ADULT GUMMY PO) Take by mouth daily.    Historical Keyli Duross, MD  ondansetron (ZOFRAN) 4 MG tablet Take 1 tablet (4 mg total) by  mouth every 8 (eight) hours as needed for nausea or vomiting. Patient not taking: Reported on 12/19/2014 08/26/13   Samuel Jester, DO  predniSONE (DELTASONE) 20 MG tablet Take 2 tablets (40 mg total) by mouth daily. For 5 days 06/02/15   Pauline Aus, PA-C  traMADol (ULTRAM) 50 MG tablet 1 or 2 q6h prn pain, take with food Patient not taking: Reported on 12/19/2014 01/03/14   Ivery Quale, PA-C  triamcinolone cream (KENALOG) 0.1 % Apply 1 application topically 2 (two) times daily. Please apply  to affected area 3 times daily. Patient not taking: Reported on 12/19/2014 01/03/14   Ivery Quale, PA-C   BP 116/61 mmHg  Pulse 94  Temp(Src) 98.5 F (36.9 C) (Oral)  Resp 16  Ht  (1.727 m)  Wt 143 lb (64.864 kg)  BMI 21.75 kg/m2  SpO2 97%  LMP 05/11/2015 Physical Exam  Constitutional: She is oriented to person, place, and time. She appears well-developed and well-nourished. No distress.  HENT:  Head: Normocephalic and atraumatic.  Right Ear: Tympanic membrane and ear canal normal.  Left Ear: Tympanic membrane and ear canal normal.  Nose: Mucosal edema and rhinorrhea present.  Mouth/Throat: Uvula is midline, oropharynx is clear and moist and mucous membranes are normal. No oropharyngeal exudate.  Eyes: EOM are normal. Pupils are equal, round, and reactive to light.  Neck: Normal range of motion, full passive range of motion without pain and phonation normal. Neck supple.  Cardiovascular: Normal rate, regular rhythm, normal heart sounds and intact distal pulses.   No murmur heard. Pulmonary/Chest: Effort normal. No stridor. No respiratory distress. She has wheezes. She has no rales. She exhibits no tenderness.  Coarse lungs sounds bilaterally with inspiratory and expiratory wheezes bilaterally.  No rales or stridor  Abdominal: Soft. She exhibits no distension. There is no tenderness.  Musculoskeletal: Normal range of motion. She exhibits no edema.  Lymphadenopathy:    She has no cervical adenopathy.  Neurological: She is alert and oriented to person, place, and time. She exhibits normal muscle tone. Coordination normal.  Skin: Skin is warm and dry.  Psychiatric: She has a normal mood and affect.  Nursing note and vitals reviewed.   ED Course  Procedures (including critical care time) Labs Review Labs Reviewed - No data to display  Imaging Review No results found. I have personally reviewed and evaluated these images and lab results as part of my medical  decision-making.   EKG Interpretation None      MDM   Final diagnoses:  Asthma exacerbation    Lung sounds improved after neb.  Vitals stable, pt non-toxic appearing.  No indication for need of imaging at this time.  Sx's likely related to exacerbation of asthma,  Stable for d/c, agrees to tx with albuterol and prednisone, and close PMD f/u or to return here for any worsening sx's    Pauline Aus, PA-C 06/04/15 1336  Bethann Berkshire, MD 06/05/15 [phone]

## 2015-06-14 NOTE — ED Notes (Signed)
Pt reported she could not get her prescriptions filled until today but [facility] would not fill them because it had been longer than 7 days.  Notified H. Beverely Pace PA and ok to call prescriptions in for today.  Called prescriptions to [facility] at [place] in [place].

## 2015-07-21 ENCOUNTER — Encounter (HOSPITAL_COMMUNITY): Payer: Self-pay | Admitting: Nurse Practitioner

## 2015-07-21 ENCOUNTER — Emergency Department (HOSPITAL_COMMUNITY): Payer: Medicaid Other

## 2015-07-21 ENCOUNTER — Emergency Department (HOSPITAL_COMMUNITY)
Admission: EM | Admit: 2015-07-21 | Discharge: 2015-07-21 | Disposition: A | Discharge status: Home / Self Care | Payer: Medicaid Other | Attending: Emergency Medicine | Admitting: Emergency Medicine

## 2015-07-21 DIAGNOSIS — R102 Pelvic and perineal pain unspecified side: Secondary | ICD-10-CM

## 2015-07-21 DIAGNOSIS — Z79899 Other long term (current) drug therapy: Secondary | ICD-10-CM | POA: Insufficient documentation

## 2015-07-21 DIAGNOSIS — O99511 Diseases of the respiratory system complicating pregnancy, first trimester: Secondary | ICD-10-CM | POA: Diagnosis not present

## 2015-07-21 DIAGNOSIS — Z3A10 10 weeks gestation of pregnancy: Secondary | ICD-10-CM | POA: Insufficient documentation

## 2015-07-21 DIAGNOSIS — G8929 Other chronic pain: Secondary | ICD-10-CM | POA: Insufficient documentation

## 2015-07-21 DIAGNOSIS — Z7951 Long term (current) use of inhaled steroids: Secondary | ICD-10-CM | POA: Diagnosis not present

## 2015-07-21 DIAGNOSIS — O210 Mild hyperemesis gravidarum: Secondary | ICD-10-CM | POA: Diagnosis present

## 2015-07-21 DIAGNOSIS — O9989 Other specified diseases and conditions complicating pregnancy, childbirth and the puerperium: Secondary | ICD-10-CM | POA: Insufficient documentation

## 2015-07-21 DIAGNOSIS — O99351 Diseases of the nervous system complicating pregnancy, first trimester: Secondary | ICD-10-CM | POA: Diagnosis not present

## 2015-07-21 DIAGNOSIS — J45909 Unspecified asthma, uncomplicated: Secondary | ICD-10-CM | POA: Diagnosis not present

## 2015-07-21 DIAGNOSIS — R197 Diarrhea, unspecified: Secondary | ICD-10-CM | POA: Diagnosis not present

## 2015-07-21 DIAGNOSIS — R112 Nausea with vomiting, unspecified: Secondary | ICD-10-CM

## 2015-07-21 LAB — CBC
HCT: 37.1 % (ref 36.0–46.0)
HEMOGLOBIN: 12.4 g/dL (ref 12.0–15.0)
MCH: 29.5 pg (ref 26.0–34.0)
MCHC: 33.4 g/dL (ref 30.0–36.0)
MCV: 88.3 fL (ref 78.0–100.0)
Platelets: 330 10*3/uL (ref 150–400)
RBC: 4.2 MIL/uL (ref 3.87–5.11)
RDW: 12.1 % (ref 11.5–15.5)
WBC: 4.9 10*3/uL (ref 4.0–10.5)

## 2015-07-21 LAB — COMPREHENSIVE METABOLIC PANEL
ALK PHOS: 54 U/L (ref 38–126)
ALT: 14 U/L (ref 14–54)
ANION GAP: 7 (ref 5–15)
AST: 20 U/L (ref 15–41)
Albumin: 3.9 g/dL (ref 3.5–5.0)
BUN: 10 mg/dL (ref 6–20)
CALCIUM: 9.4 mg/dL (ref 8.9–10.3)
CHLORIDE: 104 mmol/L (ref 101–111)
CO2: 23 mmol/L (ref 22–32)
Creatinine, Ser: 0.68 mg/dL (ref 0.44–1.00)
Glucose, Bld: 113 mg/dL — ABNORMAL HIGH (ref 65–99)
Potassium: 3.7 mmol/L (ref 3.5–5.1)
SODIUM: 134 mmol/L — AB (ref 135–145)
Total Bilirubin: 0.9 mg/dL (ref 0.3–1.2)
Total Protein: 7.6 g/dL (ref 6.5–8.1)

## 2015-07-21 LAB — URINE MICROSCOPIC-ADD ON

## 2015-07-21 LAB — URINALYSIS, ROUTINE W REFLEX MICROSCOPIC
Bilirubin Urine: NEGATIVE
GLUCOSE, UA: NEGATIVE mg/dL
KETONES UR: 15 mg/dL — AB
NITRITE: NEGATIVE
PROTEIN: NEGATIVE mg/dL
Specific Gravity, Urine: 1.023 (ref 1.005–1.030)
Urobilinogen, UA: 0.2 mg/dL (ref 0.0–1.0)
pH: 6.5 (ref 5.0–8.0)

## 2015-07-21 LAB — I-STAT BETA HCG BLOOD, ED (MC, WL, AP ONLY)

## 2015-07-21 MED ORDER — SODIUM CHLORIDE 0.9 % IV BOLUS (SEPSIS)
1000.0000 mL | Freq: Once | INTRAVENOUS | Status: AC
  Administered 2015-07-21: 1000 mL via INTRAVENOUS

## 2015-07-21 MED ORDER — METOCLOPRAMIDE HCL 5 MG/ML IJ SOLN
10.0000 mg | Freq: Once | INTRAMUSCULAR | Status: AC
  Administered 2015-07-21: 10 mg via INTRAVENOUS
  Filled 2015-07-21: qty 2

## 2015-07-21 MED ORDER — METOCLOPRAMIDE HCL 10 MG PO TABS: 10.0000 mg | ORAL_TABLET | Freq: Three times a day (TID) | ORAL | Status: DC | PRN

## 2015-07-21 NOTE — ED Notes (Signed)
She states "it feels like i have a stomach bug, ive had nausea, vomiting, fevers, urinary frequency, diarrhea since last night." She is [redacted] weeks pregnant. She just got medicaid approval and scheduled her first obgyn appt for 11/16. She denies any pain now but did have some lower abd pain earlier. She denies vagginal bleeding but has increased vaginal discharge today.

## 2015-07-21 NOTE — Discharge Instructions (Signed)
Back Pain in Pregnancy °Back pain during pregnancy is common. It happens in about half of all pregnancies. It is important for you and your baby that you remain active during your pregnancy. If you feel that back pain is not allowing you to remain active or sleep well, it is time to see your caregiver. Back pain may be caused by several factors related to changes during your pregnancy. Fortunately, unless you had trouble with your back before your pregnancy, the pain is likely to get better after you deliver. °Low back pain usually occurs between the fifth and seventh months of pregnancy. It can, however, happen in the first couple months. Factors that increase the risk of back problems include:  °· Previous back problems. °· Injury to your back. °· Having twins or multiple births. °· A chronic cough. °· Stress. °· Job-related repetitive motions. °· Muscle or spinal disease in the back. °· Family history of back problems, ruptured (herniated) discs, or osteoporosis. °· Depression, anxiety, and panic attacks. °CAUSES  °· When you are pregnant, your body produces a hormone called relaxin. This hormone makes the ligaments connecting the low back and pubic bones more flexible. This flexibility allows the baby to be delivered more easily. When your ligaments are loose, your muscles need to work harder to support your back. Soreness in your back can come from tired muscles. Soreness can also come from back tissues that are irritated since they are receiving less support. °· As the baby grows, it puts pressure on the nerves and blood vessels in your pelvis. This can cause back pain. °· As the baby grows and gets heavier during pregnancy, the uterus pushes the stomach muscles forward and changes your center of gravity. This makes your back muscles work harder to maintain good posture. °SYMPTOMS  °Lumbar pain during pregnancy °Lumbar pain during pregnancy usually occurs at or above the waist in the center of the back. There  may be pain and numbness that radiates into your leg or foot. This is similar to low back pain experienced by non-pregnant women. It usually increases with sitting for long periods of time, standing, or repetitive lifting. Tenderness may also be present in the muscles along your upper back. °Posterior pelvic pain during pregnancy °Pain in the back of the pelvis is more common than lumbar pain in pregnancy. It is a deep pain felt in your side at the waistline, or across the tailbone (sacrum), or in both places. You may have pain on one or both sides. This pain can also go into the buttocks and backs of the upper thighs. Pubic and groin pain may also be present. The pain does not quickly resolve with rest, and morning stiffness may also be present. °Pelvic pain during pregnancy can be brought on by most activities. A high level of fitness before and during pregnancy may or may not prevent this problem. Labor pain is usually 1 to 2 minutes apart, lasts for about 1 minute, and involves a bearing down feeling or pressure in your pelvis. However, if you are at term with the pregnancy, constant low back pain can be the beginning of early labor, and you should be aware of this. °DIAGNOSIS  °X-rays of the back should not be done during the first 12 to 14 weeks of the pregnancy and only when absolutely necessary during the rest of the pregnancy. MRIs do not give off radiation and are safe during pregnancy. MRIs also should only be done when absolutely necessary. °HOME CARE INSTRUCTIONS °· Exercise   as directed by your caregiver. Exercise is the most effective way to prevent or manage back pain. If you have a back problem, it is especially important to avoid sports that require sudden body movements. Swimming and walking are great activities. °· Do not stand in one place for long periods of time. °· Do not wear high heels. °· Sit in chairs with good posture. Use a pillow on your lower back if necessary. Make sure your head  rests over your shoulders and is not hanging forward. °· Try sleeping on your side, preferably the left side, with a pillow or two between your legs. If you are sore after a night's rest, your bed may be too soft. Try placing a board between your mattress and box spring. °· Listen to your body when lifting. If you are experiencing pain, ask for help or try bending your knees more so you can use your leg muscles rather than your back muscles. Squat down when picking up something from the floor. Do not bend over. °· Eat a healthy diet. Try to gain weight within your caregiver's recommendations. °· Use heat or cold packs 3 to 4 times a day for 15 minutes to help with the pain. °· Only take over-the-counter or prescription medicines for pain, discomfort, or fever as directed by your caregiver. °Sudden (acute) back pain °· Use bed rest for only the most extreme, acute episodes of back pain. Prolonged bed rest over 48 hours will aggravate your condition. °· Ice is very effective for acute conditions. °· Put ice in a plastic bag. °· Place a towel between your skin and the bag. °· Leave the ice on for 10 to 20 minutes every 2 hours, or as needed. °· Using heat packs for 30 minutes prior to activities is also helpful. °Continued back pain °See your caregiver if you have continued problems. Your caregiver can help or refer you for appropriate physical therapy. With conditioning, most back problems can be avoided. Sometimes, a more serious issue may be the cause of back pain. You should be seen right away if new problems seem to be developing. Your caregiver may recommend: °· A maternity girdle. °· An elastic sling. °· A back brace. °· A massage therapist or acupuncture. °SEEK MEDICAL CARE IF:  °· You are not able to do most of your daily activities, even when taking the pain medicine you were given. °· You need a referral to a physical therapist or chiropractor. °· You want to try acupuncture. °SEEK IMMEDIATE MEDICAL CARE  IF: °· You develop numbness, tingling, weakness, or problems with the use of your arms or legs. °· You develop severe back pain that is no longer relieved with medicines. °· You have a sudden change in bowel or bladder control. °· You have increasing pain in other areas of the body. °· You develop shortness of breath, dizziness, or fainting. °· You develop nausea, vomiting, or sweating. °· You have back pain which is similar to labor pains. °· You have back pain along with your water breaking or vaginal bleeding. °· You have back pain or numbness that travels down your leg. °· Your back pain developed after you fell. °· You develop pain on one side of your back. You may have a kidney stone. °· You see blood in your urine. You may have a bladder infection or kidney stone. °· You have back pain with blisters. You may have shingles. °Back pain is fairly common during pregnancy but should not be accepted as just part of   the process. Back pain should always be treated as soon as possible. This will make your pregnancy as pleasant as possible.   This information is not intended to replace advice given to you by your health care provider. Make sure you discuss any questions you have with your health care provider.   Document Released: 12/04/2005 Document Revised: 11/18/2011 Document Reviewed: 01/15/2011 Elsevier Interactive Patient Education 2016 [employer]  Diarrhea Diarrhea is frequent loose and watery bowel movements. It can cause you to feel weak and dehydrated. Dehydration can cause you to become tired and thirsty, have a dry mouth, and have decreased urination that often is dark yellow. Diarrhea is a sign of another problem, most often an infection that will not last long. In most cases, diarrhea typically lasts 2-3 days. However, it can last longer if it is a sign of something more serious. It is important to treat your diarrhea as directed by your caregiver to lessen or prevent future episodes of  diarrhea. CAUSES  Some common causes include:  Gastrointestinal infections caused by viruses, bacteria, or parasites.  Food poisoning or food allergies.  Certain medicines, such as antibiotics, chemotherapy, and laxatives.  Artificial sweeteners and fructose.  Digestive disorders. HOME CARE INSTRUCTIONS  Ensure adequate fluid intake (hydration): Have 1 cup (8 oz) of fluid for each diarrhea episode. Avoid fluids that contain simple sugars or sports drinks, fruit juices, whole milk products, and sodas. Your urine should be clear or pale yellow if you are drinking enough fluids. Hydrate with an oral rehydration solution that you can purchase at pharmacies, retail stores, and online. You can prepare an oral rehydration solution at home by mixing the following ingredients together:   - tsp table salt.   tsp baking soda.   tsp salt substitute containing potassium chloride.  1  tablespoons sugar.  1 L (34 oz) of water.  Certain foods and beverages may increase the speed at which food moves through the gastrointestinal (GI) tract. These foods and beverages should be avoided and include:  Caffeinated and alcoholic beverages.  High-fiber foods, such as raw fruits and vegetables, nuts, seeds, and whole grain breads and cereals.  Foods and beverages sweetened with sugar alcohols, such as xylitol, sorbitol, and mannitol.  Some foods may be well tolerated and may help thicken stool including:  Starchy foods, such as rice, toast, pasta, low-sugar cereal, oatmeal, grits, baked potatoes, crackers, and bagels.  Bananas.  Applesauce.  Add probiotic-rich foods to help increase healthy bacteria in the GI tract, such as yogurt and fermented milk products.  Wash your hands well after each diarrhea episode.  Only take over-the-counter or prescription medicines as directed by your caregiver.  Take a warm bath to relieve any burning or pain from frequent diarrhea episodes. SEEK IMMEDIATE  MEDICAL CARE IF:   You are unable to keep fluids down.  You have persistent vomiting.  You have blood in your stool, or your stools are black and tarry.  You do not urinate in 6-8 hours, or there is only a small amount of very dark urine.  You have abdominal pain that increases or localizes.  You have weakness, dizziness, confusion, or light-headedness.  You have a severe headache.  Your diarrhea gets worse or does not get better.  You have a fever or persistent symptoms for more than 2-3 days.  You have a fever and your symptoms suddenly get worse. MAKE SURE YOU:   Understand these instructions.  Will watch your condition.  Will get help  right away if you are not doing well or get worse.   This information is not intended to replace advice given to you by your health care provider. Make sure you discuss any questions you have with your health care provider.   Document Released: 08/16/2002 Document Revised: 09/16/2014 Document Reviewed: 05/03/2012 Elsevier Interactive Patient Education [employer]

## 2015-07-21 NOTE — ED Notes (Signed)
Sharrie RothmanErin Howell RN and Edwina BarthArthur Rowe RN attempted IV insertion 2x each. Will have another RN try when pt. Returns from [place].

## 2015-07-21 NOTE — ED Provider Notes (Signed)
Arrival Date & Time: 07/21/15 & 1257 History   Chief Complaint  Patient presents with  . Emesis   HPI Sharon Nash is a 19 y.o. female who presents for assessment of emesis and diarrhea for the past 2 days. Patient is a employee of: [employer] and is concerned that she picked up a "bug from one of the from one of the customers". Patient states that she is [redacted] weeks pregnant and follows with obstetrics and gynecology however has not had prenatal appointment nor dating and visualization ultrasound. Patient states she has had intermittent emesis during her first pregnancy and throughout this second pregnancy. Patient has lost 8 pounds in the last 2 weeks.  Patient's past medical remarkable for asthma however denies abnormal breathing chest pain or shortness of breath at this time. Patient denies vaginal bleeding or any vaginal discharge that is different from his neurologic discharge.  Past Medical History  I reviewed & agree with nursing's documentation of PMHx, PSHx, SHx & FHx. Past Medical History  Diagnosis Date  . Asthma   . Respiratory failure, acute (HCC) 06/2012    bipap only, not intubated  . Chronic abdominal pain   . Chronic knee pain    History reviewed. No pertinent past surgical history. Social History   Social History  . Marital Status: Single    Spouse Name: N/A  . Number of Children: N/A  . Years of Education: N/A   Social History Main Topics  . Smoking status: Never Smoker   . Smokeless tobacco: None  . Alcohol Use: No  . Drug Use: No  . Sexual Activity: Yes    Birth Control/ Protection: Implant   Other Topics Concern  . None   Social History Narrative   Family History  Problem Relation Age of Onset  . Asthma Sister   . Diabetes Maternal Grandmother   . Hypertension Maternal Grandmother   . Diabetes Maternal Grandfather   . Asthma Maternal Grandfather     Review of Systems   Complete Review of Systems obtained and is negative except as stated in  HPI.  Allergies  Shrimp  Home Medications   Prior to Admission medications   Medication Sig Start Date End Date Taking? Authorizing Provider  albuterol (PROVENTIL) (2.5 MG/3ML) 0.083% nebulizer solution Take 3 mLs (2.5 mg total) by nebulization every 6 (six) hours as needed for wheezing or shortness of breath. 06/02/15  Yes Tammy Triplett, PA-C  beclomethasone (QVAR) 80 MCG/ACT inhaler Inhale 1 puff into the lungs 2 (two) times daily. 07/09/12  Yes Leona SingletonMaria T Thekkekandam, MD  cephALEXin (KEFLEX) 500 MG capsule Take 1 capsule (500 mg total) by mouth 4 (four) times daily. 12/19/14   Samuel JesterKathleen McManus, DO  ibuprofen (ADVIL,MOTRIN) 800 MG tablet Take 1 tablet (800 mg total) by mouth 3 (three) times daily. Patient not taking: Reported on 12/19/2014 01/03/14   Ivery QualeHobson Bryant, PA-C  metoCLOPramide (REGLAN) 10 MG tablet Take 1 tablet (10 mg total) by mouth every 8 (eight) hours as needed for nausea or vomiting. 07/21/15   Jonette EvaBrad Garfield Coiner, MD  ondansetron (ZOFRAN) 4 MG tablet Take 1 tablet (4 mg total) by mouth every 8 (eight) hours as needed for nausea or vomiting. Patient not taking: Reported on 12/19/2014 08/26/13   Samuel JesterKathleen McManus, DO  predniSONE (DELTASONE) 20 MG tablet Take 2 tablets (40 mg total) by mouth daily. For 5 days 06/02/15   Tammy Triplett, PA-C  traMADol (ULTRAM) 50 MG tablet 1 or 2 q6h prn pain, take with food Patient  not taking: Reported on 12/19/2014 01/03/14   Ivery Quale, PA-C  triamcinolone cream (KENALOG) 0.1 % Apply 1 application topically 2 (two) times daily. Please apply to affected area 3 times daily. Patient not taking: Reported on 12/19/2014 01/03/14   Ivery Quale, PA-C    Physical Exam  BP 119/77 mmHg  Pulse 106  Temp(Src) 98.2 F (36.8 C) (Oral)  Resp 14  Ht  (1.727 m)  Wt 141 lb 12.8 oz (64.32 kg)  BMI 21.57 kg/m2  SpO2 100%  LMP 05/12/2015 Physical Exam Vitals & Nursing notes reviewed. CONST: female, in no acute distress. Appears WD/WN & stated age. HEAD:  [place]/AT. EYES: PERRL. No conjunctival injection & lids symmetrical. ENMT: External nose & ears atraumatic. MM dry.  Oropharynx w/o swelling or exudates. NECK: Supple, w/o meningismus. Trachea midline w/o JVD. Stridor absent. CVS: S1/S2 audible w/o gallops. Murmur absent. Peripheral pulses 2+ & equal in all extremities. Cap refill < 2 seconds. RESP: Respiratory effort normal. Lungs CTAB, w/o wheeze. GI: Soft, w/o TTP. Guarding & rebound absent. BS normal. BACK: W/o CVA TTP bilaterally. SKIN: Warm & dry, w/o rash. W/o open wound. NEURO: AAOx3. CN II-XIII grossly intact.  Sensation w/o deficit. Strength w/o deficit. Tremor absent. PSYCH:  Cooperative, w/ mood & affect appropriate. MSK: Extremities w/o deformity or TTP.  Joints stable, w/o warmth. W/o cyanosis.   ED Course  Procedures  Labs Review Labs Reviewed  COMPREHENSIVE METABOLIC PANEL - Abnormal; Notable for the following:    Sodium 134 (*)    Glucose, Bld 113 (*)    All other components within normal limits  URINALYSIS, ROUTINE W REFLEX MICROSCOPIC (NOT AT [facility]) - Abnormal; Notable for the following:    APPearance TURBID (*)    Hgb urine dipstick TRACE (*)    Ketones, ur 15 (*)    Leukocytes, UA SMALL (*)    All other components within normal limits  URINE MICROSCOPIC-ADD ON - Abnormal; Notable for the following:    Squamous Epithelial / LPF MANY (*)    Bacteria, UA MANY (*)    All other components within normal limits  I-STAT BETA HCG BLOOD, ED ([facility], [facility], [facility] ONLY) - Abnormal; Notable for the following:    I-stat hCG, quantitative >2000.0 (*)    All other components within normal limits  CBC    Imaging Review No results found.  Laboratory and Imaging results were personally reviewed by myself and used in the medical decision making of this patient's treatment and disposition.  EKG Interpretation  EKG Interpretation  Date/Time:    Ventricular Rate:    PR Interval:    QRS Duration:   QT Interval:    QTC  Calculation:   R Axis:     Text Interpretation:        MDM  Sharon Nash is a 19 y.o. female with H&P as above. ED clinical course as follows:  Patient in NAD. Afebrile without evidence of toxicity. Patient hemodynamically stable, without evidence of altered mentation. Patient without increased work of breathing and no evidence of hypoxia on room air.  HPI without concerning red flags in context of patient's recently diagnosed pregnancy.laboratory work reveals no concerning findings on CBC or CMP. UA reveals no evidence of lower or ascending urinary tract infection, and given trace amounts of hemoglobin and reassuring story do not suspect stone at this time.   Physical examination reassuring; without evidence of concerning abnormalities upon exam of abdomen. As pregnancy is of unknown location this time I  obtained ultrasound OB transvaginal transabdominal reveals single live anterior pregnancy with measured gestational age of [redacted] weeks and 1 day. No evidence of pregnancy complication or concerning maternal abnormalities. Cardiac activity visualized. Patient has no evidence of acute metabolic and mildly or other concerning finding that requires intervention at this time. Patient low risk for pancreatitis and appendicitis or perforated abdominal viscus per physical examination. Patient is not hypertensive and has no right upper quadrant tenderness palpation therefore do not suspect gallbladder or common bile duct or liver pathology. Extensive discussion was had with patient regarding her pregnancy status and concerns that any medication would provide during this time has potential theoretical risk to the fetus and this includes death disability or defect. Patient states understanding of this. Patient still requests medication be given at this time.  Given reassuring exam findings and patient's improvement of synthetic endorsements following antiemetic and patient now tolerating by mouth without  symptomatically return patient was cautioned upon return precautions and states understanding. Patient is follow-up with her obstetrics and gynecology the following week and no further concerns or questions prior to discharge. Clinical Impression:  1. Non-intractable vomiting with nausea, vomiting of unspecified type   2. Pelvic pain in female   3. Diarrhea, unspecified type    Patient care discussed with Dr. Adela Lank, who oversaw their evaluation & treatment & voiced agreement. House Officer: Jonette Eva, MD, Emergency Medicine.  Jonette Eva, MD 07/27/15 1610  Jonette Eva, MD 07/27/15 [id number]  Melene Plan, DO 07/27/15 1000

## 2015-07-25 ENCOUNTER — Other Ambulatory Visit: Payer: Self-pay | Admitting: Obstetrics & Gynecology

## 2015-07-25 DIAGNOSIS — O3680X0 Pregnancy with inconclusive fetal viability, not applicable or unspecified: Secondary | ICD-10-CM

## 2015-07-26 ENCOUNTER — Telehealth: Payer: Self-pay | Admitting: *Deleted

## 2015-07-26 ENCOUNTER — Ambulatory Visit: Payer: Medicaid Other

## 2015-07-26 NOTE — Telephone Encounter (Signed)
Note for chart

## 2015-08-08 ENCOUNTER — Encounter: Payer: Medicaid Other | Admitting: Women's Health

## 2015-08-14 ENCOUNTER — Encounter: Payer: Self-pay | Admitting: Women's Health

## 2015-08-14 ENCOUNTER — Encounter: Payer: Medicaid Other | Admitting: Women's Health

## 2015-08-24 ENCOUNTER — Encounter: Payer: Medicaid Other | Admitting: Women's Health

## 2015-08-28 ENCOUNTER — Observation Stay (HOSPITAL_COMMUNITY)
Admission: EM | Admit: 2015-08-28 | Discharge: 2015-08-30 | Disposition: A | Discharge status: Home / Self Care | Payer: Medicaid Other | Attending: Family Medicine | Admitting: Family Medicine

## 2015-08-28 ENCOUNTER — Encounter (HOSPITAL_COMMUNITY): Payer: Self-pay | Admitting: *Deleted

## 2015-08-28 DIAGNOSIS — Z3A14 14 weeks gestation of pregnancy: Secondary | ICD-10-CM | POA: Diagnosis not present

## 2015-08-28 DIAGNOSIS — Z79899 Other long term (current) drug therapy: Secondary | ICD-10-CM | POA: Diagnosis not present

## 2015-08-28 DIAGNOSIS — Z23 Encounter for immunization: Secondary | ICD-10-CM | POA: Diagnosis not present

## 2015-08-28 DIAGNOSIS — O99511 Diseases of the respiratory system complicating pregnancy, first trimester: Secondary | ICD-10-CM | POA: Insufficient documentation

## 2015-08-28 DIAGNOSIS — K920 Hematemesis: Secondary | ICD-10-CM | POA: Diagnosis not present

## 2015-08-28 DIAGNOSIS — Z3491 Encounter for supervision of normal pregnancy, unspecified, first trimester: Secondary | ICD-10-CM

## 2015-08-28 DIAGNOSIS — G8929 Other chronic pain: Secondary | ICD-10-CM | POA: Diagnosis not present

## 2015-08-28 DIAGNOSIS — O210 Mild hyperemesis gravidarum: Secondary | ICD-10-CM | POA: Diagnosis present

## 2015-08-28 DIAGNOSIS — O99611 Diseases of the digestive system complicating pregnancy, first trimester: Secondary | ICD-10-CM | POA: Diagnosis not present

## 2015-08-28 DIAGNOSIS — Z7951 Long term (current) use of inhaled steroids: Secondary | ICD-10-CM | POA: Diagnosis not present

## 2015-08-28 DIAGNOSIS — K922 Gastrointestinal hemorrhage, unspecified: Secondary | ICD-10-CM | POA: Diagnosis present

## 2015-08-28 DIAGNOSIS — O99351 Diseases of the nervous system complicating pregnancy, first trimester: Secondary | ICD-10-CM | POA: Diagnosis not present

## 2015-08-28 DIAGNOSIS — J45909 Unspecified asthma, uncomplicated: Secondary | ICD-10-CM | POA: Insufficient documentation

## 2015-08-28 LAB — CBC WITH DIFFERENTIAL/PLATELET
BASOS PCT: 1 %
Basophils Absolute: 0 10*3/uL (ref 0.0–0.1)
EOS ABS: 0.3 10*3/uL (ref 0.0–0.7)
Eosinophils Relative: 6 %
HCT: 34 % — ABNORMAL LOW (ref 36.0–46.0)
HEMOGLOBIN: 11.5 g/dL — AB (ref 12.0–15.0)
LYMPHS ABS: 1.8 10*3/uL (ref 0.7–4.0)
Lymphocytes Relative: 32 %
MCH: 30.2 pg (ref 26.0–34.0)
MCHC: 33.8 g/dL (ref 30.0–36.0)
MCV: 89.2 fL (ref 78.0–100.0)
MONO ABS: 0.4 10*3/uL (ref 0.1–1.0)
MONOS PCT: 8 %
NEUTROS PCT: 53 %
Neutro Abs: 3 10*3/uL (ref 1.7–7.7)
Platelets: 281 10*3/uL (ref 150–400)
RBC: 3.81 MIL/uL — ABNORMAL LOW (ref 3.87–5.11)
RDW: 12.3 % (ref 11.5–15.5)
WBC: 5.5 10*3/uL (ref 4.0–10.5)

## 2015-08-28 LAB — COMPREHENSIVE METABOLIC PANEL
ALBUMIN: 3.6 g/dL (ref 3.5–5.0)
ALK PHOS: 42 U/L (ref 38–126)
ALT: 7 U/L — AB (ref 14–54)
ANION GAP: 7 (ref 5–15)
AST: 13 U/L — ABNORMAL LOW (ref 15–41)
BILIRUBIN TOTAL: 0.4 mg/dL (ref 0.3–1.2)
BUN: 11 mg/dL (ref 6–20)
CALCIUM: 9.2 mg/dL (ref 8.9–10.3)
CO2: 25 mmol/L (ref 22–32)
CREATININE: 0.61 mg/dL (ref 0.44–1.00)
Chloride: 102 mmol/L (ref 101–111)
GFR calc Af Amer: >60 mL/min (ref >60)
GFR calc non Af Amer: >60 mL/min (ref >60)
GLUCOSE: 82 mg/dL (ref 65–99)
Potassium: 3.7 mmol/L (ref 3.5–5.1)
SODIUM: 134 mmol/L — AB (ref 135–145)
TOTAL PROTEIN: 7 g/dL (ref 6.5–8.1)

## 2015-08-28 LAB — HCG, QUANTITATIVE, PREGNANCY: HCG, BETA CHAIN, QUANT, S: 75051 m[IU]/mL — AB (ref <5)

## 2015-08-28 LAB — POC OCCULT BLOOD, ED: Fecal Occult Bld: POSITIVE — AB

## 2015-08-28 LAB — I-STAT BETA HCG BLOOD, ED (MC, WL, AP ONLY): I-stat hCG, quantitative: >2000 m[IU]/mL — ABNORMAL HIGH (ref <5)

## 2015-08-28 LAB — I-STAT CG4 LACTIC ACID, ED: LACTIC ACID, VENOUS: 0.65 mmol/L (ref 0.5–2.0)

## 2015-08-28 LAB — LIPASE, BLOOD: Lipase: 24 U/L (ref 11–51)

## 2015-08-28 MED ORDER — SODIUM CHLORIDE 0.9 % IV SOLN
1000.0000 mL | INTRAVENOUS | Status: DC
  Administered 2015-08-28 – 2015-08-29 (×2): 1000 mL via INTRAVENOUS

## 2015-08-28 MED ORDER — ONDANSETRON HCL 4 MG/2ML IJ SOLN
4.0000 mg | Freq: Once | INTRAMUSCULAR | Status: AC
  Administered 2015-08-28: 4 mg via INTRAVENOUS
  Filled 2015-08-28: qty 2

## 2015-08-28 MED ORDER — PANTOPRAZOLE SODIUM 40 MG IV SOLR
40.0000 mg | Freq: Once | INTRAVENOUS | Status: AC
  Administered 2015-08-28: 40 mg via INTRAVENOUS
  Filled 2015-08-28: qty 40

## 2015-08-28 MED ORDER — SODIUM CHLORIDE 0.9 % IV SOLN
1000.0000 mL | Freq: Once | INTRAVENOUS | Status: AC
  Administered 2015-08-28: 1000 mL via INTRAVENOUS

## 2015-08-28 NOTE — ED Provider Notes (Signed)
CSN: [id number]     Arrival date & time 08/28/15  2022 History  By signing my name below, I, Sharon Nash, attest that this documentation has been prepared under the direction and in the presence of Sharon Booze, MD. Electronically Signed: Budd Nash, ED Scribe. 08/28/2015. 9:44 PM.     Chief Complaint  Patient presents with  . Hematemesis   The history is provided by the patient. No language interpreter was used.   HPI Comments: Sharon Nash is a 19 y.o. female with a PMHx of asthma who presents to the Emergency Department complaining of 5 episodes of hematemesis onset 3 hours ago. Pt states the vomit was half blood, half vomit, then streaking, then back to half and half. She notes her most recent episodes included only a large amount of blood with some sputum. She reports associated nausea, generalized cramping abdominal pain (onset 1 day ago), cough (onset 1 day ago), chills, and diaphoresis. She denies any recent sick contacts with the same. She notes her son recently had a cold with post-tussive emesis, but states that since he fell ill, pt did not see him until today. She notes she did see her son 3 days ago when he was not yet sick. She also reports she is on a BCP. Pt denies fever.   Past Medical History  Diagnosis Date  . Asthma   . Respiratory failure, acute (HCC) 06/2012    bipap only, not intubated  . Chronic abdominal pain   . Chronic knee pain    History reviewed. No pertinent past surgical history. Family History  Problem Relation Age of Onset  . Asthma Sister   . Diabetes Maternal Grandmother   . Hypertension Maternal Grandmother   . Diabetes Maternal Grandfather   . Asthma Maternal Grandfather    Social History  Substance Use Topics  . Smoking status: Never Smoker   . Smokeless tobacco: None  . Alcohol Use: No   OB History    Gravida Para Term Preterm AB TAB SAB Ectopic Multiple Living   1         1     Review of Systems  Constitutional: Positive  for chills and diaphoresis. Negative for fever.  Respiratory: Positive for cough.   Gastrointestinal: Positive for nausea, vomiting and abdominal pain.  All other systems reviewed and are negative.   Allergies  Shrimp  Home Medications   Prior to Admission medications   Medication Sig Start Date End Date Taking? Authorizing Provider  albuterol (PROVENTIL) (2.5 MG/3ML) 0.083% nebulizer solution Take 3 mLs (2.5 mg total) by nebulization every 6 (six) hours as needed for wheezing or shortness of breath. 06/02/15  Yes Tammy Triplett, PA-C  beclomethasone (QVAR) 80 MCG/ACT inhaler Inhale 1 puff into the lungs 2 (two) times daily. 07/09/12  Yes Leona Singleton, MD  metoCLOPramide (REGLAN) 10 MG tablet Take 1 tablet (10 mg total) by mouth every 8 (eight) hours as needed for nausea or vomiting. Patient not taking: Reported on 08/28/2015 07/21/15   Jonette Eva, MD  predniSONE (DELTASONE) 20 MG tablet Take 2 tablets (40 mg total) by mouth daily. For 5 days Patient not taking: Reported on 08/28/2015 06/02/15   Tammy Triplett, PA-C   BP 125/79 mmHg  Pulse 102  Temp(Src) 97.8 F (36.6 C) (Oral)  Resp 18  Ht  (1.727 m)  Wt 150 lb (68.04 kg)  BMI 22.81 kg/m2  SpO2 100%  LMP 05/12/2015 Physical Exam  Constitutional: She is oriented to person,  place, and time. She appears well-developed and well-nourished.  HENT:  Head: Normocephalic and atraumatic.  Eyes: Conjunctivae are normal. Pupils are equal, round, and reactive to light. Right eye exhibits no discharge. Left eye exhibits no discharge.  Neck: Normal range of motion. Neck supple. No JVD present.  Cardiovascular: Normal rate, regular rhythm and normal heart sounds.   No murmur heard. Pulmonary/Chest: Effort normal and breath sounds normal. She has no wheezes. She has no rales. She exhibits no tenderness.  Abdominal: Soft. She exhibits no distension and no mass. There is tenderness. There is no rebound and no guarding.  Mild  epigastric TTP, bowel sounds decreased  Musculoskeletal: Normal range of motion. She exhibits no edema.  Lymphadenopathy:    She has no cervical adenopathy.  Neurological: She is alert and oriented to person, place, and time. No cranial nerve deficit. She exhibits normal muscle tone. Coordination normal.  Skin: Skin is warm and dry. No rash noted. She is not diaphoretic. No erythema.  Psychiatric: She has a normal mood and affect. Her behavior is normal. Judgment and thought content normal.  Nursing note and vitals reviewed.   ED Course  Procedures   COORDINATION OF CARE: 9:41 PM - Discussed plans to order anti-nausea medication, IV fluids, and diagnostic studies. Pt advised of plan for treatment and pt agrees.  Labs Review Results for orders placed or performed during the hospital encounter of 08/28/15  Comprehensive metabolic panel  Result Value Ref Range   Sodium 134 (L) 135 - 145 mmol/L   Potassium 3.7 3.5 - 5.1 mmol/L   Chloride 102 101 - 111 mmol/L   CO2 25 22 - 32 mmol/L   Glucose, Bld 82 65 - 99 mg/dL   BUN 11 6 - 20 mg/dL   Creatinine, Ser 9.60 0.44 - 1.00 mg/dL   Calcium 9.2 8.9 - 45.4 mg/dL   Total Protein 7.0 6.5 - 8.1 g/dL   Albumin 3.6 3.5 - 5.0 g/dL   AST 13 (L) 15 - 41 U/L   ALT 7 (L) 14 - 54 U/L   Alkaline Phosphatase 42 38 - 126 U/L   Total Bilirubin 0.4 0.3 - 1.2 mg/dL   GFR calc non Af Amer >60 >60 mL/min   GFR calc Af Amer >60 >60 mL/min   Anion gap 7 5 - 15  Lipase, blood  Result Value Ref Range   Lipase 24 11 - 51 U/L  CBC with Differential  Result Value Ref Range   WBC 5.5 4.0 - 10.5 K/uL   RBC 3.81 (L) 3.87 - 5.11 MIL/uL   Hemoglobin 11.5 (L) 12.0 - 15.0 g/dL   HCT 09.8 (L) 11.9 - 14.7 %   MCV 89.2 78.0 - 100.0 fL   MCH 30.2 26.0 - 34.0 pg   MCHC 33.8 30.0 - 36.0 g/dL   RDW 82.9 56.2 - 13.0 %   Platelets 281 150 - 400 K/uL   Neutrophils Relative % 53 %   Neutro Abs 3.0 1.7 - 7.7 K/uL   Lymphocytes Relative 32 %   Lymphs Abs 1.8 0.7 - 4.0  K/uL   Monocytes Relative 8 %   Monocytes Absolute 0.4 0.1 - 1.0 K/uL   Eosinophils Relative 6 %   Eosinophils Absolute 0.3 0.0 - 0.7 K/uL   Basophils Relative 1 %   Basophils Absolute 0.0 0.0 - 0.1 K/uL  hCG, quantitative, pregnancy  Result Value Ref Range   hCG, Beta Chain, Quant, S 75051 (H) <5 mIU/mL  I-Stat CG4 Lactic  Acid, ED  Result Value Ref Range   Lactic Acid, Venous 0.65 0.5 - 2.0 mmol/L  I-Stat beta hCG blood, ED  Result Value Ref Range   I-stat hCG, quantitative >2000.0 (H) <5 mIU/mL   Comment 3          POC occult blood, ED  Result Value Ref Range   Fecal Occult Bld POSITIVE (A) NEGATIVE   I have personally reviewed and evaluated these lab results as part of my medical decision-making.   MDM   Final diagnoses:  Upper gastrointestinal bleeding  First trimester pregnancy    Nausea and vomiting with hematemesis. Which she vomited in the ED had only a small amount of pinkish material. This was tested and found to be gastric all positive. Hemoglobin is noted to have dropped 1 g from value from one month ago. She is noted to have significant rise in heart rate 1 orthostatic blood pressure testing. Also, pregnancy test is positive. When patient is told of this, she tells me that she actually has missed 2 cycles and that she knew she was pregnant. She had calculated a due date in July. Because of drop in hemoglobin and orthostatic changes, it is felt that she needs to be observed to make sure she is not having ongoing bleeding. I suspect a self-limited Mallory-Weiss tear, but she may need endoscopy for further evaluation. Case is discussed with Dr. Sharl MaLama of [facility] who agrees to admit her under observation status.  I personally performed the services described in this documentation, which was scribed in my presence. The recorded information has been reviewed and is accurate.      Sharon Boozeavid Khiree Bukhari, MD 08/28/15 [phone]

## 2015-08-28 NOTE — ED Notes (Signed)
Pt reporting cough since yesterday.  Noticed bloody emesis today.

## 2015-08-28 NOTE — H&P (Signed)
PCP:   Lubertha SouthSteve Luking, MD   Chief Complaint:  Vomiting blood  HPI:  19 year old female who  has a past medical history of Asthma; Respiratory failure, acute (HCC) (06/2012); Chronic abdominal pain; and Chronic knee pain. Today presents to the hospital with 5 episodes of bloody vomitus at home. Patient is [redacted] weeks pregnant, G2 P1 A0, started having nausea vomiting for past 2 days. She also complains of abdominal pain. Denies chest pain or shortness of breath. Denies fever. In the ED hemoglobin was 11.5, previous hemoglobin in November 2016 was 12.4. Patient given Zofran and Protonix in the ED.  Allergies:   Allergies  Allergen Reactions  . Shrimp [Shellfish Allergy] Anaphylaxis      Past Medical History  Diagnosis Date  . Asthma   . Respiratory failure, acute (HCC) 06/2012    bipap only, not intubated  . Chronic abdominal pain   . Chronic knee pain     History reviewed. No pertinent past surgical history.  Prior to Admission medications   Medication Sig Start Date End Date Taking? Authorizing Provider  albuterol (PROVENTIL) (2.5 MG/3ML) 0.083% nebulizer solution Take 3 mLs (2.5 mg total) by nebulization every 6 (six) hours as needed for wheezing or shortness of breath. 06/02/15  Yes Tammy Triplett, PA-C  beclomethasone (QVAR) 80 MCG/ACT inhaler Inhale 1 puff into the lungs 2 (two) times daily. 07/09/12  Yes Leona SingletonMaria T Thekkekandam, MD  metoCLOPramide (REGLAN) 10 MG tablet Take 1 tablet (10 mg total) by mouth every 8 (eight) hours as needed for nausea or vomiting. Patient not taking: Reported on 08/28/2015 07/21/15   Jonette EvaBrad Chapman, MD  predniSONE (DELTASONE) 20 MG tablet Take 2 tablets (40 mg total) by mouth daily. For 5 days Patient not taking: Reported on 08/28/2015 06/02/15   Pauline Ausammy Triplett, PA-C    Social History:  reports that she has never smoked. She does not have any smokeless tobacco history on file. She reports that she does not drink alcohol or use illicit  drugs.  Family History  Problem Relation Age of Onset  . Asthma Sister   . Diabetes Maternal Grandmother   . Hypertension Maternal Grandmother   . Diabetes Maternal Grandfather   . Asthma Maternal Lavell AnchorsGrandfather     Filed Weights   08/28/15 2038  Weight: 68.04 kg (150 lb)    All the positives are listed in BOLD  Review of Systems:  HEENT: Headache, blurred vision, runny nose, sore throat Neck: Hypothyroidism, hyperthyroidism,,lymphadenopathy Chest : Shortness of breath, history of COPD, Asthma Heart : Chest pain, history of coronary arterey disease GI:  Nausea, vomiting, diarrhea, constipation, GERD Neuro: Stroke, seizures, syncope Psych: Depression, anxiety, hallucinations   Physical Exam: Blood pressure 125/79, pulse 102, temperature 97.8 F (36.6 C), temperature source Oral, resp. rate 18, height 5\' 8"  (1.727 m), weight 68.04 kg (150 lb), last menstrual period 05/12/2015, SpO2 100 %. Constitutional:   Patient is a well-developed and well-nourished *female in no acute distress and cooperative with exam. Head: Normocephalic and atraumatic Mouth: Mucus membranes moist Eyes: PERRL, EOMI, conjunctivae normal Neck: Supple, No Thyromegaly Cardiovascular: RRR, S1 normal, S2 normal Pulmonary/Chest: CTAB, no wheezes, rales, or rhonchi Abdominal: Soft. Non-tender, non-distended, bowel sounds are normal, no masses, organomegaly, or guarding present.  Neurological: A&O x3, Strength is normal and symmetric bilaterally, cranial nerve II-XII are grossly intact, no focal motor deficit, sensory intact to light touch bilaterally.  Extremities : No Cyanosis, Clubbing or Edema  Labs on Admission:  Basic Metabolic Panel:  Recent Labs  Lab 08/28/15 2152  NA 134*  K 3.7  CL 102  CO2 25  GLUCOSE 82  BUN 11  CREATININE 0.61  CALCIUM 9.2   Liver Function Tests:  Recent Labs Lab 08/28/15 2152  AST 13*  ALT 7*  ALKPHOS 42  BILITOT 0.4  PROT 7.0  ALBUMIN 3.6    Recent Labs Lab  08/28/15 2152  LIPASE 24   No results for input(s): AMMONIA in the last 168 hours. CBC:  Recent Labs Lab 08/28/15 2152  WBC 5.5  NEUTROABS 3.0  HGB 11.5*  HCT 34.0*  MCV 89.2  PLT 281        Assessment/Plan Active Problems:   Upper gastrointestinal bleeding   Hematemesis  Hematemesis Admit the patient under observation, will obtain serial H&H every 8 hours. Zofran when necessary for nausea and vomiting. Protonix 40 g IV daily. Consult GI in a.m.  Abdominal pain Likely from vomiting, start when necessary morphine  DVT prophylaxis SCDs  Code status: Full code  Family discussion: Admission, patients condition and plan of care including tests being ordered have been discussed with the patient and her mother at bedside* who indicate understanding and agree with the plan and Code Status.   Time Spent on Admission: 60 min  Brittannie Tawney S [facility] Pager: [phone] 08/28/2015, 11:43 PM  If 7PM-7AM, please contact night-coverage  [email or web address]  Password [email or web address]

## 2015-08-28 NOTE — ED Notes (Signed)
Pt c/o vomiting that started started today, pt states that she noticed her emesis had dark blood in it as well, had cough yesterday,

## 2015-08-29 ENCOUNTER — Encounter (HOSPITAL_COMMUNITY): Payer: Self-pay | Admitting: *Deleted

## 2015-08-29 DIAGNOSIS — K920 Hematemesis: Secondary | ICD-10-CM

## 2015-08-29 DIAGNOSIS — Z331 Pregnant state, incidental: Secondary | ICD-10-CM

## 2015-08-29 DIAGNOSIS — Z3A12 12 weeks gestation of pregnancy: Secondary | ICD-10-CM | POA: Diagnosis not present

## 2015-08-29 DIAGNOSIS — R1013 Epigastric pain: Secondary | ICD-10-CM

## 2015-08-29 DIAGNOSIS — K922 Gastrointestinal hemorrhage, unspecified: Secondary | ICD-10-CM

## 2015-08-29 DIAGNOSIS — D62 Acute posthemorrhagic anemia: Secondary | ICD-10-CM

## 2015-08-29 DIAGNOSIS — O99011 Anemia complicating pregnancy, first trimester: Secondary | ICD-10-CM | POA: Diagnosis not present

## 2015-08-29 LAB — COMPREHENSIVE METABOLIC PANEL
ALK PHOS: 34 U/L — AB (ref 38–126)
ALT: 7 U/L — AB (ref 14–54)
AST: 10 U/L — AB (ref 15–41)
Albumin: 2.8 g/dL — ABNORMAL LOW (ref 3.5–5.0)
Anion gap: 4 — ABNORMAL LOW (ref 5–15)
BUN: 6 mg/dL (ref 6–20)
CALCIUM: 8.3 mg/dL — AB (ref 8.9–10.3)
CHLORIDE: 108 mmol/L (ref 101–111)
CO2: 24 mmol/L (ref 22–32)
CREATININE: 0.55 mg/dL (ref 0.44–1.00)
GFR calc Af Amer: >60 mL/min (ref >60)
GFR calc non Af Amer: >60 mL/min (ref >60)
Glucose, Bld: 80 mg/dL (ref 65–99)
Potassium: 3.9 mmol/L (ref 3.5–5.1)
SODIUM: 136 mmol/L (ref 135–145)
Total Bilirubin: 0.8 mg/dL (ref 0.3–1.2)
Total Protein: 5.5 g/dL — ABNORMAL LOW (ref 6.5–8.1)

## 2015-08-29 LAB — HEMOGLOBIN AND HEMATOCRIT, BLOOD
HCT: 29.1 % — ABNORMAL LOW (ref 36.0–46.0)
HCT: 30.7 % — ABNORMAL LOW (ref 36.0–46.0)
HEMATOCRIT: 30.1 % — AB (ref 36.0–46.0)
HEMOGLOBIN: 10.1 g/dL — AB (ref 12.0–15.0)
Hemoglobin: 10.4 g/dL — ABNORMAL LOW (ref 12.0–15.0)
Hemoglobin: 9.9 g/dL — ABNORMAL LOW (ref 12.0–15.0)

## 2015-08-29 LAB — ABO/RH: ABO/RH(D): O NEG

## 2015-08-29 MED ORDER — METOCLOPRAMIDE HCL 10 MG/10ML PO SOLN
10.0000 mg | Freq: Three times a day (TID) | ORAL | Status: DC
  Administered 2015-08-29 – 2015-08-30 (×5): 10 mg via ORAL
  Filled 2015-08-29 (×13): qty 10

## 2015-08-29 MED ORDER — MORPHINE SULFATE (PF) 2 MG/ML IV SOLN
1.0000 mg | INTRAVENOUS | Status: DC | PRN
  Administered 2015-08-29 (×4): 1 mg via INTRAVENOUS
  Filled 2015-08-29 (×4): qty 1

## 2015-08-29 MED ORDER — ONDANSETRON HCL 4 MG/2ML IJ SOLN: 4.0000 mg | Freq: Four times a day (QID) | INTRAMUSCULAR | Status: DC | PRN

## 2015-08-29 MED ORDER — PANTOPRAZOLE SODIUM 40 MG IV SOLR
40.0000 mg | INTRAVENOUS | Status: DC
  Administered 2015-08-29: 40 mg via INTRAVENOUS
  Filled 2015-08-29: qty 40

## 2015-08-29 MED ORDER — INFLUENZA VAC SPLIT QUAD 0.5 ML IM SUSY
0.5000 mL | PREFILLED_SYRINGE | INTRAMUSCULAR | Status: AC
  Administered 2015-08-30: 0.5 mL via INTRAMUSCULAR
  Filled 2015-08-29: qty 0.5

## 2015-08-29 MED ORDER — PROMETHAZINE HCL 12.5 MG PO TABS: 25.0000 mg | ORAL_TABLET | Freq: Four times a day (QID) | ORAL | Status: DC | PRN

## 2015-08-29 MED ORDER — SODIUM CHLORIDE 0.9 % IV SOLN: INTRAVENOUS | Status: DC

## 2015-08-29 MED ORDER — SUCRALFATE 1 GM/10ML PO SUSP
1.0000 g | Freq: Three times a day (TID) | ORAL | Status: DC
  Administered 2015-08-29 – 2015-08-30 (×5): 1 g via ORAL
  Filled 2015-08-29 (×6): qty 10

## 2015-08-29 MED ORDER — PANTOPRAZOLE SODIUM 40 MG IV SOLR
40.0000 mg | Freq: Two times a day (BID) | INTRAVENOUS | Status: DC
  Administered 2015-08-29 – 2015-08-30 (×2): 40 mg via INTRAVENOUS
  Filled 2015-08-29 (×2): qty 40

## 2015-08-29 MED ORDER — ONDANSETRON HCL 4 MG/2ML IJ SOLN
4.0000 mg | Freq: Three times a day (TID) | INTRAMUSCULAR | Status: DC
  Administered 2015-08-29 – 2015-08-30 (×4): 4 mg via INTRAVENOUS
  Filled 2015-08-29 (×4): qty 2

## 2015-08-29 MED ORDER — ALBUTEROL SULFATE (2.5 MG/3ML) 0.083% IN NEBU: 2.5000 mg | INHALATION_SOLUTION | Freq: Four times a day (QID) | RESPIRATORY_TRACT | Status: DC | PRN

## 2015-08-29 MED ORDER — PROMETHAZINE HCL 25 MG RE SUPP: 25.0000 mg | Freq: Four times a day (QID) | RECTAL | Status: DC | PRN

## 2015-08-29 MED ORDER — SODIUM CHLORIDE 0.9 % IV SOLN
1000.0000 mL | INTRAVENOUS | Status: DC
  Administered 2015-08-29 – 2015-08-30 (×3): 1000 mL via INTRAVENOUS

## 2015-08-29 MED ORDER — PNEUMOCOCCAL VAC POLYVALENT 25 MCG/0.5ML IJ INJ: 0.5000 mL | INJECTION | INTRAMUSCULAR | Status: DC

## 2015-08-29 NOTE — Consult Note (Signed)
Referring Provider: Dr. Sharl Ma  Primary Care Physician:  Lubertha South, MD  Primary OB/GYN: [facility], [place], [place]. Patient has NOT been established as a new patient there yet, but she had an appointment scheduled for 08/30/15. Has cancelled/no-showed prior appointments.  Primary Gastroenterologist:  Dr. Darrick Penna   Date of Admission: 08/28/15 Date of Consultation: 08/29/15  Reason for Consultation:  Hematemesis   HPI:  Sharon Nash is a 19 y.o. year old female who is 11 weeks, 5 days pregnant (calculated by pregnancy wheel using estimated date of delivery, last ultrasound 07/21/15 that estimated gestational age, and conferring with Dr. Despina Hidden for confirmation), who presented to the ED yesterday with hematemesis.   She states she has had liquid emesis when first waking up with morning sickness but resolves as the day goes on. She has had no severe vomiting episodes during pregnancy. She states she noted first episode of hematemesis yesterday. States she had acute onset of vomiting dark blood, mixed with some bright red. Had repeated episodes that were then bright red blood. Has epigastric pain radiating to the LUQ and down. Not currently taking NSAIDs. Had been on chronic Ibuprofen prior to pregnancy. Was taking due to epigastric pain. Pain present for at least 6 months but had eased off during beginning of pregnancy. States she has had black stool yesterday and the day before. Appears a month ago her Hgb was 12.4. On admission, her Hgb was 11.5 and has drifted to 9.9 as of this morning. Heme positive stool in the ED.   N/V has resolved at time of consultation. She tolerated chicken broth this morning. She would like to eat something now. No further black stool since admission. It must be noted that she has NOT established care with an OB/GYN but did have several appointments that were canceled and one "no-show" with [facility] in [place]. In fact, she was scheduled to see Cathie Beams, CNM on 12/21. She tells me that she has missed appointments due to being sick.    Past Medical History  Diagnosis Date  . Asthma   . Respiratory failure, acute (HCC) 06/2012    bipap only, not intubated, due to asthma exacerbation  . Chronic abdominal pain   . Chronic knee pain     Past Surgical History  Procedure Laterality Date  . None      Prior to Admission medications   Medication Sig Start Date End Date Taking? Authorizing Provider  albuterol (PROVENTIL) (2.5 MG/3ML) 0.083% nebulizer solution Take 3 mLs (2.5 mg total) by nebulization every 6 (six) hours as needed for wheezing or shortness of breath. 06/02/15  Yes Tammy Triplett, PA-C  beclomethasone (QVAR) 80 MCG/ACT inhaler Inhale 1 puff into the lungs 2 (two) times daily. 07/09/12  Yes Leona Singleton, MD  metoCLOPramide (REGLAN) 10 MG tablet Take 1 tablet (10 mg total) by mouth every 8 (eight) hours as needed for nausea or vomiting. Patient not taking: Reported on 08/28/2015 07/21/15   Jonette Eva, MD  predniSONE (DELTASONE) 20 MG tablet Take 2 tablets (40 mg total) by mouth daily. For 5 days Patient not taking: Reported on 08/28/2015 06/02/15   Pauline Aus, PA-C    Current Facility-Administered Medications  Medication Dose Route Frequency Provider Last Rate Last Dose  . 0.9 %  sodium chloride infusion  1,000 mL Intravenous Continuous [name], MD 125 mL/hr at 08/29/15 0330 1,000 mL at 08/29/15 0330  . 0.9 %  sodium chloride infusion   Intravenous STAT [name], MD      .  albuterol (PROVENTIL) (2.5 MG/3ML) 0.083% nebulizer solution 2.5 mg  2.5 mg Nebulization Q6H PRN Meredeth Ide, MD      . Melene Muller ON 08/30/2015] Influenza vac split quadrivalent PF (FLUARIX) injection 0.5 mL  0.5 mL Intramuscular Tomorrow-1000 Meredeth Ide, MD      . morphine 2 MG/ML injection 1 mg  1 mg Intravenous Q4H PRN Meredeth Ide, MD   1 mg at 08/29/15 0555  . ondansetron (ZOFRAN) injection 4 mg  4 mg Intravenous Q6H PRN  Meredeth Ide, MD      . pantoprazole (PROTONIX) injection 40 mg  40 mg Intravenous Q24H Meredeth Ide, MD   40 mg at 08/29/15 0555    Allergies as of 08/28/2015 - Review Complete 08/28/2015  Allergen Reaction Noted  . Shrimp [shellfish allergy] Anaphylaxis 06/07/2011    Family History  Problem Relation Age of Onset  . Asthma Sister   . Diabetes Maternal Grandmother   . Hypertension Maternal Grandmother   . Diabetes Maternal Grandfather   . Asthma Maternal Grandfather   . Colon cancer Neg Hx   . Breast cancer Maternal Aunt     Social History   Social History  . Marital Status: Single    Spouse Name: N/A  . Number of Children: N/A  . Years of Education: N/A   Occupational History  . Not on file.   Social History Main Topics  . Smoking status: Never Smoker   . Smokeless tobacco: Not on file  . Alcohol Use: No  . Drug Use: No  . Sexual Activity: Yes    Birth Control/ Protection: Implant   Other Topics Concern  . Not on file   Social History Narrative    Review of Systems: Gen: Denies fever, chills, loss of appetite, change in weight or weight loss CV: Denies chest pain, heart palpitations, syncope, edema  Resp: Shortness of breath this morning  GI: see HPI  GU : Denies urinary burning, urinary frequency, urinary incontinence.  MS: Denies joint pain,swelling, cramping Derm: Denies rash, itching, dry skin Psych: Denies depression, anxiety,confusion, or memory loss Heme: Denies bruising, bleeding, and enlarged lymph nodes.  Physical Exam: Vital signs in last 24 hours: Temp:  [97.8 F (36.6 C)-99.1 F (37.3 C)] 99.1 F (37.3 C) (12/20 0531) Pulse Rate:  [77-102] 77 (12/20 0531) Resp:  [16-20] 16 (12/20 0531) BP: (114-125)/(65-86) 114/67 mmHg (12/20 0531) SpO2:  [99 %-100 %] 99 % (12/20 0531) Weight:  [145 lb 3.2 oz (65.862 kg)-150 lb (68.04 kg)] 145 lb 3.2 oz (65.862 kg) (12/20 0030) Last BM Date: 08/28/15 General:   Alert,  Well-developed, well-nourished,  pleasant and cooperative in NAD Head:  Normocephalic and atraumatic. Eyes:  Sclera clear, no icterus.   Conjunctiva pink. Ears:  Normal auditory acuity. Nose:  No deformity, discharge,  or lesions. Mouth:  No deformity or lesions, dentition normal. Lungs:  Clear throughout to auscultation.   No wheezes, crackles, or rhonchi. No acute distress. Heart:  Regular rate and rhythm; no murmurs, clicks, rubs,  or gallops. Abdomen:  Soft, very mild TTP epigastric/LUQ and nondistended. No masses, hepatosplenomegaly or hernias noted. Normal bowel sounds, without guarding, and without rebound.   Rectal:  Deferred  Msk:  Symmetrical without gross deformities. Normal posture. Extremities:  Without edema. Neurologic:  Alert and  oriented x4;  grossly normal neurologically. Psych:  Alert and cooperative. Normal mood and affect.  Intake/Output from previous day: 12/19 0701 - 12/20 0700 In: 1119.2 [P.O.:240; I.V.:879.2] Out: -  Intake/Output this shift:    Lab Results:  Recent Labs  08/28/15 2152 08/29/15 0025 08/29/15 0758  WBC 5.5  --   --   HGB 11.5* 10.4* 9.9*  HCT 34.0* 30.7* 29.1*  PLT 281  --   --    BMET  Recent Labs  08/28/15 2152 08/29/15 0758  NA 134* 136  K 3.7 3.9  CL 102 108  CO2 25 24  GLUCOSE 82 80  BUN 11 6  CREATININE 0.61 0.55  CALCIUM 9.2 8.3*   LFT  Recent Labs  08/28/15 2152 08/29/15 0758  PROT 7.0 5.5*  ALBUMIN 3.6 2.8*  AST 13* 10*  ALT 7* 7*  ALKPHOS 42 34*  BILITOT 0.4 0.8    Impression: 19 year old female who is 11 weeks, 5 days pregnant with second child, admitted with hematemesis and possible melena per her report, with a drift in Hgb from 11.5 to 9.9 since admission. Clinically improved with supportive measures and without any further overt GI bleeding. She has had mild symptoms of nausea and vomiting each morning attributed to pregnancy but without refractory N/V. In presence of NSAIDs historically, query gastritis, but more likely acute  hematemesis is due to a MW tear type of presentation, esophagitis. Although [facility], she has had several cancellations and one no-show; she was actually scheduled to see the nurse midwife on 08/30/15. Dr[facility] spoke with me even though patient has not been physically seen at their location. As patient is 11 weeks, 5 days, an endoscopic evaluation is not recommended unless significant overt GI bleeding. Ideally, any procedures would be after 14 weeks. Protonix IV and Carafate for supportive measures were recommended. As patient is no longer overtly bleeding, would recommend monitoring, recheck H/H in the morning, and provide supportive measures. If endoscopic evaluation recommended, will want to ensure fetal monitoring is available [facility] during the procedure. For now, no endoscopic evaluation is recommended unless significant clinical change that would warrant therapeutic intervention.   Plan: Continue IV Protonix for now and transition to oral once diet advanced Full liquids ordered Will add Carafate for supportive measures Recheck CBC in am Hold on endoscopic evaluation currently unless further overt GI bleeding, worsening anemia Needs outpatient follow-up with [facility] to establish care May be discharged on Prilosec, which may be more cost-efficient for patient Will continue to follow with you  Nira RetortAnna W. Sams, ANP-BC [facility]        08/29/2015, 8:54 AM  Addendum: Obstetrics consultation placed in epic per Dr. Darrick PennaFields for further recommendations regarding sedation, fetal heart monitoring.   Nira RetortAnna W. Sams, ANP-BC [facility]

## 2015-08-29 NOTE — Plan of Care (Signed)
Problem: Pain Managment: Goal: General experience of comfort will improve Outcome: Progressing Pt stated she had pain upon admission. Pt given pain medication and is now asleep in bed.

## 2015-08-29 NOTE — Progress Notes (Signed)
[facility] PROGRESS NOTE  Sharon Nash:[id number] DOB: [birth year] DOA: 08/28/2015 PCP: Lubertha South, MD  19 year old female who is [redacted] weeks pregnant presented to the ED with 5 episodes of hematemesis on the day of admission.it was  coffee-ground to begin with but the last episode were bright red and each was about a medicine cup full. She reports taking Motrin until 5 months back, denies smoking or alcohol use or steroid use. She reports having similar symptoms a few years ago which subsided on its own. She has never had an endoscopy. She does report acid reflux symptoms and mild epigastric pain but denies melena, fevers, chills, chest pain or shortness of breath. In the ED she was found to have drop in her hemoglobin to 10.4 from her baseline of around 12.5 1113.5. Admitted to hospitalist service for further management  Assessment/Plan: Upper GI bleed Differential includes gastritis, peptic ulcer disease, erosive esophagitis. -Symptoms have resolved since admission. Monitor on clears. Added IV PPI. Hemoglobin further dropped this morning to 9.9. GI consulted.  Abdominal pain Possibly associated with vomiting. Currently resolved.  asthma Stable. On home inhaler.  Pregnancy , 14 weeks, G2P1 Follows with gyn. Please access right to administering any new medications to rule out potential side effects to pregnancy and fetus.   Code Status: Full code Family Communication: None at bedside Disposition Plan: Home once stable and workup completed.   Consultants:  GI  Procedures:  None  Antibiotics:  None  HPI/Subjective: Seen and examined. Has not had further hematemesis since admission. Abdominal pain resolved.  Objective: Filed Vitals:   08/29/15 0030 08/29/15 0531  BP: 121/65 114/67  Pulse: 88 77  Temp: 98.4 F (36.9 C) 99.1 F (37.3 C)  Resp: 18 16    Intake/Output Summary (Last 24 hours) at 08/29/15 0857 Last data filed at 08/29/15 0500  Gross per  24 hour  Intake 1119.17 ml  Output      0 ml  Net 1119.17 ml   Filed Weights   08/28/15 2038 08/29/15 0030  Weight: 68.04 kg (150 lb) 65.862 kg (145 lb 3.2 oz)    Exam:   General:  Young female in no acute distress  HEENT: No pallor, moist mucosa, supple neck  Chest: Clear to auscultation bilaterally  CVS: Normal S1 and S2, no murmurs  GI: Soft, nondistended, nontender, bowel sounds present  Musculoskeletal: no edema    Data Reviewed: Basic Metabolic Panel:  Recent Labs Lab 08/28/15 2152 08/29/15 0758  NA 134* 136  K 3.7 3.9  CL 102 108  CO2 25 24  GLUCOSE 82 80  BUN 11 6  CREATININE 0.61 0.55  CALCIUM 9.2 8.3*   Liver Function Tests:  Recent Labs Lab 08/28/15 2152 08/29/15 0758  AST 13* 10*  ALT 7* 7*  ALKPHOS 42 34*  BILITOT 0.4 0.8  PROT 7.0 5.5*  ALBUMIN 3.6 2.8*    Recent Labs Lab 08/28/15 2152  LIPASE 24   No results for input(s): AMMONIA in the last 168 hours. CBC:  Recent Labs Lab 08/28/15 2152 08/29/15 0025 08/29/15 0758  WBC 5.5  --   --   NEUTROABS 3.0  --   --   HGB 11.5* 10.4* 9.9*  HCT 34.0* 30.7* 29.1*  MCV 89.2  --   --   PLT 281  --   --    Cardiac Enzymes: No results for input(s): CKTOTAL, CKMB, CKMBINDEX, TROPONINI in the last 168 hours. BNP (last 3 results) No results for input(s): BNP  in the last 8760 hours.  ProBNP (last 3 results) No results for input(s): PROBNP in the last 8760 hours.  CBG: No results for input(s): GLUCAP in the last 168 hours.  No results found for this or any previous visit (from the past 240 hour(s)).   Studies: No results found.  Scheduled Meds: . sodium chloride   Intravenous STAT  . [START ON 08/30/2015] Influenza vac split quadrivalent PF  0.5 mL Intramuscular Tomorrow-1000  . pantoprazole (PROTONIX) IV  40 mg Intravenous Q24H   Continuous Infusions: . sodium chloride 1,000 mL (08/29/15 0330)      Time spent: 25 minutes    Kahron Kauth  [facility] Pager [phone] If 7PM-7AM, please contact night-coverage at [email or web address], password [email or web address] 08/29/2015, 8:57 AM

## 2015-08-29 NOTE — Consult Note (Signed)
Reason for Consult:hyperemesis, vomiting with Bright blood .  Referring Physician: [name] is an 19 y.o. female. G1P0 at 67w5dby November u/s. Pt has NOT established care but [facility]. Admitted last night with BRB in vomiting , described``as thick blood initially and then thinning, amounting to maybe 4 oz. Of bloody vomitus. Pt has gained 9 pounds since admission in November 11 for similar N&V. She has not become dehydrated, and voids frequently. Pertinent Gynecological History: Menses:  Bleeding:  Contraception: pregnant DES exposure: unknown Blood transfusions: none Sexually transmitted diseases: no past history Previous GYN Procedures:   Last mammogram:  Date:  Last pap:  Date: too young to require OB History: G1, P0   Menstrual History: Menarche age: Patient's last menstrual period was 05/12/2015. U/s on 11/11 /16 places her at 164w6d  Past Medical History  Diagnosis Date  . Asthma   . [place]    bipap only, not intubated, due to asthma exacerbation  . Chronic abdominal pain   . Chronic knee pain     Past Surgical History  Procedure Laterality Date  . None      Family History  Problem Relation Age of Onset  . Asthma Sister   . Diabetes Maternal Grandmother   . Hypertension Maternal Grandmother   . Diabetes Maternal Grandfather   . Asthma Maternal Grandfather   . Colon cancer Neg Hx   . Breast cancer Maternal Aunt     Social History:  reports that she has never smoked. She does not have any smokeless tobacco history on file. She reports that she does not drink alcohol or use illicit drugs.  Allergies:  Allergies  Allergen Reactions  . Shrimp [Shellfish Allergy] Anaphylaxis    Medications: I have reviewed the patient's current medications.  ROS  Blood pressure 114/67, pulse 77, temperature 99.1 F (37.3 C), temperature source Oral, resp. rate 16,  height 5' 8" (1.727 m), weight 145 lb 3.2 oz (65.862 kg), last menstrual period 05/12/2015, SpO2 99 %. Physical Exam  Constitutional: She is oriented to person, place, and time. She appears well-developed and well-nourished.  Hydrated, no acute malaise at present  HENT:  Head: Normocephalic.  Eyes: Pupils are equal, round, and reactive to light.  Neck: Neck supple.  Cardiovascular: Normal rate.   Respiratory: Effort normal and breath sounds normal.  GI: Soft. Bowel sounds are normal. She exhibits no distension and no mass. There is no tenderness. There is no rebound and no guarding.  Genitourinary:  Pelvic deferred til prenatal visit. U/s 11/11 confirmed a normal singleton IUP with normal cardiac activity.  Musculoskeletal: Normal range of motion.  Neurological: She is alert and oriented to person, place, and time.  Skin: Skin is warm and dry.  Good skin turgor  Psychiatric: She has a normal mood and affect. Her behavior is normal.    Results for orders placed or performed during the hospital encounter of 08/28/15 (from the past 48 hour(s))  Comprehensive metabolic panel     Status: Abnormal   Collection Time: 08/28/15  9:52 PM  Result Value Ref Range   Sodium 134 (L) 135 - 145 mmol/L   Potassium 3.7 3.5 - 5.1 mmol/L   Chloride 102 101 - 111 mmol/L   CO2 25 22 - 32 mmol/L   Glucose, Bld 82 65 - 99 mg/dL   BUN 11 6 - 20 mg/dL   Creatinine, Ser  0.61 0.44 - 1.00 mg/dL   Calcium 9.2 8.9 - 10.3 mg/dL   Total Protein 7.0 6.5 - 8.1 g/dL   Albumin 3.6 3.5 - 5.0 g/dL   AST 13 (L) 15 - 41 U/L   ALT 7 (L) 14 - 54 U/L   Alkaline Phosphatase 42 38 - 126 U/L   Total Bilirubin 0.4 0.3 - 1.2 mg/dL   GFR calc non Af Amer >60 >60 mL/min   GFR calc Af Amer >60 >60 mL/min    Comment: (NOTE) The eGFR has been calculated using the CKD EPI equation. This calculation has not been validated in all clinical situations. eGFR's persistently <60 mL/min signify possible Chronic Kidney Disease.     Anion gap 7 5 - 15  Lipase, blood     Status: None   Collection Time: 08/28/15  9:52 PM  Result Value Ref Range   Lipase 24 11 - 51 U/L  CBC with Differential     Status: Abnormal   Collection Time: 08/28/15  9:52 PM  Result Value Ref Range   WBC 5.5 4.0 - 10.5 K/uL   RBC 3.81 (L) 3.87 - 5.11 MIL/uL   Hemoglobin 11.5 (L) 12.0 - 15.0 g/dL   HCT 34.0 (L) 36.0 - 46.0 %   MCV 89.2 78.0 - 100.0 fL   MCH 30.2 26.0 - 34.0 pg   MCHC 33.8 30.0 - 36.0 g/dL   RDW 12.3 11.5 - 15.5 %   Platelets 281 150 - 400 K/uL   Neutrophils Relative % 53 %   Neutro Abs 3.0 1.7 - 7.7 K/uL   Lymphocytes Relative 32 %   Lymphs Abs 1.8 0.7 - 4.0 K/uL   Monocytes Relative 8 %   Monocytes Absolute 0.4 0.1 - 1.0 K/uL   Eosinophils Relative 6 %   Eosinophils Absolute 0.3 0.0 - 0.7 K/uL   Basophils Relative 1 %   Basophils Absolute 0.0 0.0 - 0.1 K/uL  hCG, quantitative, pregnancy     Status: Abnormal   Collection Time: 08/28/15  9:52 PM  Result Value Ref Range   hCG, Beta Chain, Quant, S 75051 (H) <5 mIU/mL    Comment:          GEST. AGE      CONC.  (mIU/mL)   <=1 WEEK        5 - 50     2 WEEKS       50 - 500     3 WEEKS       100 - 10,000     4 WEEKS     1,000 - 30,000     5 WEEKS     3,500 - 115,000   6-8 WEEKS     12,000 - 270,000    12 WEEKS     15,000 - 220,000        FEMALE AND NON-PREGNANT FEMALE:     LESS THAN 5 mIU/mL   I-Stat CG4 Lactic Acid, ED     Status: None   Collection Time: 08/28/15 10:01 PM  Result Value Ref Range   Lactic Acid, Venous 0.65 0.5 - 2.0 mmol/L  I-Stat beta hCG blood, ED     Status: Abnormal   Collection Time: 08/28/15 10:01 PM  Result Value Ref Range   I-stat hCG, quantitative >2000.0 (H) <5 mIU/mL   Comment 3            Comment:   GEST. AGE      CONC.  (mIU/mL)   <=  1 WEEK        5 - 50     2 WEEKS       50 - 500     3 WEEKS       100 - 10,000     4 WEEKS     1,000 - 30,000        FEMALE AND NON-PREGNANT FEMALE:     LESS THAN 5 mIU/mL   POC occult blood, ED      Status: Abnormal   Collection Time: 08/28/15 10:05 PM  Result Value Ref Range   Fecal Occult Bld POSITIVE (A) NEGATIVE  Hemoglobin and hematocrit, blood     Status: Abnormal   Collection Time: 08/29/15 12:25 AM  Result Value Ref Range   Hemoglobin 10.4 (L) 12.0 - 15.0 g/dL   HCT 30.7 (L) 36.0 - 46.0 %  Comprehensive metabolic panel     Status: Abnormal   Collection Time: 08/29/15  7:58 AM  Result Value Ref Range   Sodium 136 135 - 145 mmol/L   Potassium 3.9 3.5 - 5.1 mmol/L   Chloride 108 101 - 111 mmol/L   CO2 24 22 - 32 mmol/L   Glucose, Bld 80 65 - 99 mg/dL   BUN 6 6 - 20 mg/dL   Creatinine, Ser 0.55 0.44 - 1.00 mg/dL   Calcium 8.3 (L) 8.9 - 10.3 mg/dL   Total Protein 5.5 (L) 6.5 - 8.1 g/dL   Albumin 2.8 (L) 3.5 - 5.0 g/dL   AST 10 (L) 15 - 41 U/L   ALT 7 (L) 14 - 54 U/L   Alkaline Phosphatase 34 (L) 38 - 126 U/L   Total Bilirubin 0.8 0.3 - 1.2 mg/dL   GFR calc non Af Amer >60 >60 mL/min   GFR calc Af Amer >60 >60 mL/min    Comment: (NOTE) The eGFR has been calculated using the CKD EPI equation. This calculation has not been validated in all clinical situations. eGFR's persistently <60 mL/min signify possible Chronic Kidney Disease.    Anion gap 4 (L) 5 - 15  Hemoglobin and hematocrit, blood     Status: Abnormal   Collection Time: 08/29/15  7:58 AM  Result Value Ref Range   Hemoglobin 9.9 (L) 12.0 - 15.0 g/dL   HCT 29.1 (L) 36.0 - 46.0 %    No results found. HGB has declined from 11.5 to 9.9 with rehydration. Assessment/Plan: Pregnancy [redacted]w[redacted]d, Hyperemesis with vomiting.Not dehydrated No evidence of acute abdomen. No ptyalism at present.  Plan:  Move to outpt care promptly, even today            Baseline prenatal labs to be ordered now           Rx Phenergan tablets and suppositories           Rx Reglan 10 mg q meal           Will hold off on prednisone dosepak for now            Will hold off on robinul            New ob appt made for 11;45  [facility]

## 2015-08-30 ENCOUNTER — Encounter: Payer: Medicaid Other | Admitting: Advanced Practice Midwife

## 2015-08-30 ENCOUNTER — Telehealth: Payer: Self-pay | Admitting: *Deleted

## 2015-08-30 DIAGNOSIS — Z3A12 12 weeks gestation of pregnancy: Secondary | ICD-10-CM | POA: Diagnosis not present

## 2015-08-30 DIAGNOSIS — K922 Gastrointestinal hemorrhage, unspecified: Secondary | ICD-10-CM | POA: Diagnosis not present

## 2015-08-30 DIAGNOSIS — R110 Nausea: Secondary | ICD-10-CM

## 2015-08-30 DIAGNOSIS — O210 Mild hyperemesis gravidarum: Secondary | ICD-10-CM

## 2015-08-30 DIAGNOSIS — Z331 Pregnant state, incidental: Secondary | ICD-10-CM | POA: Diagnosis not present

## 2015-08-30 DIAGNOSIS — K920 Hematemesis: Secondary | ICD-10-CM | POA: Diagnosis not present

## 2015-08-30 DIAGNOSIS — O99011 Anemia complicating pregnancy, first trimester: Secondary | ICD-10-CM

## 2015-08-30 LAB — CBC
HCT: 29 % — ABNORMAL LOW (ref 36.0–46.0)
HEMOGLOBIN: 9.9 g/dL — AB (ref 12.0–15.0)
MCH: 30.9 pg (ref 26.0–34.0)
MCHC: 34.1 g/dL (ref 30.0–36.0)
MCV: 90.6 fL (ref 78.0–100.0)
PLATELETS: 268 10*3/uL (ref 150–400)
RBC: 3.2 MIL/uL — AB (ref 3.87–5.11)
RDW: 12.4 % (ref 11.5–15.5)
WBC: 4.4 10*3/uL (ref 4.0–10.5)

## 2015-08-30 LAB — HEMOGLOBIN AND HEMATOCRIT, BLOOD
HCT: 28.9 % — ABNORMAL LOW (ref 36.0–46.0)
HEMATOCRIT: 27.7 % — AB (ref 36.0–46.0)
HEMOGLOBIN: 9.8 g/dL — AB (ref 12.0–15.0)
Hemoglobin: 9.5 g/dL — ABNORMAL LOW (ref 12.0–15.0)

## 2015-08-30 LAB — RPR: RPR: NONREACTIVE

## 2015-08-30 LAB — RUBELLA SCREEN: RUBELLA: 6.05 {index} (ref >0.99)

## 2015-08-30 LAB — HEPATITIS B SURFACE ANTIGEN: Hepatitis B Surface Ag: NEGATIVE

## 2015-08-30 LAB — HIV ANTIBODY (ROUTINE TESTING W REFLEX): HIV SCREEN 4TH GENERATION: NONREACTIVE

## 2015-08-30 MED ORDER — OMEPRAZOLE 40 MG PO CPDR: 40.0000 mg | DELAYED_RELEASE_CAPSULE | Freq: Two times a day (BID) | ORAL | Status: DC

## 2015-08-30 MED ORDER — PRENATAL MULTIVITAMIN CH: 1.0000 | ORAL_TABLET | Freq: Every day | ORAL | Status: DC

## 2015-08-30 MED ORDER — PRENATAL MULTIVITAMIN CH
1.0000 | ORAL_TABLET | Freq: Every day | ORAL | Status: DC
  Filled 2015-08-30 (×3): qty 1

## 2015-08-30 MED ORDER — ONDANSETRON HCL 4 MG/2ML IJ SOLN: 4.0000 mg | Freq: Three times a day (TID) | INTRAMUSCULAR | Status: DC

## 2015-08-30 MED ORDER — DOXYLAMINE-PYRIDOXINE 10-10 MG PO TBEC: 1.0000 | DELAYED_RELEASE_TABLET | Freq: Every day | ORAL | Status: DC

## 2015-08-30 MED ORDER — PROMETHAZINE HCL 25 MG RE SUPP: 25.0000 mg | Freq: Four times a day (QID) | RECTAL | Status: DC | PRN

## 2015-08-30 MED ORDER — PROMETHAZINE HCL 25 MG PO TABS: 25.0000 mg | ORAL_TABLET | Freq: Four times a day (QID) | ORAL | Status: DC | PRN

## 2015-08-30 NOTE — Progress Notes (Signed)
PROGRESS NOTE  Sharon Nash [id number]:[id number] DOB: 05/25/1996 DOA: 08/28/2015 PCP: Lubertha SouthSteve Luking, MD  Summary: 19 year old female who is [redacted] weeks pregnant presented to the ED with 5 episodes of hematemesis and epigastric pain. She reported taking Motrin until 5 months ago, denied smoking or alcohol use or steroid use. She has never had an endoscopy. In the ED she was found to have drop in her hemoglobin to 10.4 from her baseline of around 12.5. She was started on Protonix and admitted for further management.    Assessment/Plan: 1. Hyperemesis with vomiting, resolved. Possible upper GI bleed, resolved, suspect self-limited MWT. Hgb stable. GI consulted. They do not recommend an endoscopy at this time unless her symptoms worsen.  2. Abdominal pain with vomiting, resolved. Advanced diet. 3. Asthma, stable. Continue inhaler as needed. 4. Pregnancy, 14 weeks. G2P1A0. OB recommended discharge with outpatient f/u.    Overall improving.  Home today. Discussed with Dr. Emelda FearFerguson, recommended Prilosec BID, Diclegis, Phenergan tablets/suppositories, PNV.  Code Status: Full  DVT prophylaxis: SCDs Family Communication: Discussed with patient who understands and has no concerns at this time. Disposition Plan: Anticipate discharge within 24 hours  Brendia Sacksaniel Goodrich, MD  [facility]  Pager [phone] If 7PM-7AM, please contact night-coverage at [email or web address], password [email or web address] 08/30/2015, 6:47 AM    Consultants:  GI  Obstetrics  Procedures:    Antibiotics:    HPI/Subjective: Feels a little better than yesterday. Has nausea but denies any vomiting. Tolerated liquid diet well. Wants regular diet.  Objective: Filed Vitals:   08/29/15 0531 08/29/15 1722 08/29/15 2244 08/30/15 0524  BP: 114/67 109/64 112/50 122/64  Pulse: 77 79 84 68  Temp: 99.1 F (37.3 C) 98.5 F (36.9 C) 100.4 F (38 C) 97.5 F (36.4 C)  TempSrc: Oral  Oral Oral  Resp: 16 16 20 20   Height:      Weight:        SpO2: 99% 99% 100% 100%    Intake/Output Summary (Last 24 hours) at 08/30/15 0647 Last data filed at 08/29/15 1200  Gross per 24 hour  Intake    480 ml  Output      0 ml  Net    480 ml     [facility]   08/28/15 2038 08/29/15 0030  Weight: 68.04 kg (150 lb) 65.862 kg (145 lb 3.2 oz)    Exam:    VSS, afebrile General:  Appears calm and comfortable Cardiovascular: RRR, no m/r/g. No LE edema. Respiratory: CTA bilaterally, no w/r/r. Normal respiratory effort. Abdomen: soft, ntnd Psychiatric: grossly normal mood and affect, speech fluent and appropriate   New data reviewed:  Hgb 9.9, stable  Pertinent data since admission:  HCG 75051  Hgb 11.5  Pending data:    Scheduled Meds: . Influenza vac split quadrivalent PF  0.5 mL Intramuscular Tomorrow-1000  . metoCLOPramide  10 mg Oral TID WC & HS  . ondansetron (ZOFRAN) IV  4 mg Intravenous TID WC & HS  . pantoprazole (PROTONIX) IV  40 mg Intravenous BID AC  . sucralfate  1 g Oral TID WC & HS   Continuous Infusions: . sodium chloride 1,000 mL (08/29/15 2344)    Active Problems:   Upper gastrointestinal bleeding   Hematemesis   First trimester pregnancy      By signing my name below, I, Burnett HarryJennifer Gregorio attest that this documentation has been prepared under the direction and in the presence of Brendia Sacksaniel Goodrich, MD Electronically signed: Burnett HarryJennifer Gregorio, Scribe. 08/30/2015 11:36am  I personally performed  the services described in this documentation. All medical record entries made by the scribe were at my direction. I have reviewed the chart and agree that the record reflects my personal performance and is accurate and complete. Murray Hodgkins, MD

## 2015-08-30 NOTE — Discharge Summary (Signed)
Physician Discharge Summary  Sharon Nash [id number]:[id number] DOB: 1995-11-17 DOA: 08/28/2015  PCP: Lubertha South, MD  Admit date: 08/28/2015 Discharge date: 08/30/2015  Recommendations for Outpatient Follow-up:  1. Has outpatient followup with OB     Follow-up Information    Follow up with Tilda Burrow, MD On 09/13/2015.   Specialties:  Obstetrics and Gynecology, Radiology   Why:  at 1:30 pm.  [employer].   Contact information:   [address] [place] [address] [phone]      Discharge Diagnoses:  1. Hyperemesis of pregnancy 2. Possible upper GIB, suspected Mallory-Weiss eat 3. Pregnancy, 14 weeks  Discharge Condition: improved Disposition: home  Diet recommendation: regular  Filed Weights   08/28/15 2038 08/29/15 0030  Weight: 68.04 kg (150 lb) 65.862 kg (145 lb 3.2 oz)    History of present illness:  19yow presented with vomiting and hematemesis. Found to be pregnant.  Hospital Course:  Seen in consultation with OB and GI. No further hematemesis. GI rec PPI and conservative care. OB rec outpatient f/u and meds as below. Tolerating diet, Hgb stable. Hospitalization uncomplicated.  1. Hyperemesis with vomiting, resolved. Possible upper GI bleed, resolved, suspect self-limited MWT. Hgb stable. GI consulted. They do not recommend an endoscopy at this time unless her symptoms worsen.  2. Abdominal pain with vomiting, resolved. Advanced diet. 3. Asthma, stable. Continue inhaler as needed. 4. Pregnancy, 14 weeks. G2P1A0. OB recommended discharge with outpatient f/u. 5.  6. Overall improving. 7. Discussed with Dr. Emelda Fear, recommended Prilosec BID, Diclegis, Phenergan tablets and suppositories, PNV.  Consultants:  GI  Obstetrics  Discharge Instructions  Discharge Instructions    Activity as tolerated - No restrictions    Complete by:  As directed      Diet general    Complete by:  As directed      Discharge instructions    Complete  by:  As directed   Call your physician or seek immediate medical attention for vomiting, pain or worsening of condition.          Discharge Medication List as of 08/30/2015  1:09 PM    START taking these medications   Details  Doxylamine-Pyridoxine 10-10 MG TBEC Take 1 tablet by mouth daily., Starting 08/30/2015, Until Discontinued, Normal    omeprazole (PRILOSEC) 40 MG capsule Take 1 capsule (40 mg total) by mouth 2 (two) times daily before a meal., Starting 08/30/2015, Until Discontinued, Normal    Prenatal Vit-Fe Fumarate-FA (PRENATAL MULTIVITAMIN) TABS tablet Take 1 tablet by mouth daily at 12 noon., Starting 08/30/2015, Until Discontinued, Normal    promethazine (PHENERGAN) 25 MG suppository Place 1 suppository (25 mg total) rectally every 6 (six) hours as needed for nausea., Starting 08/30/2015, Until Discontinued, Normal    promethazine (PHENERGAN) 25 MG tablet Take 1 tablet (25 mg total) by mouth every 6 (six) hours as needed for nausea or vomiting., Starting 08/30/2015, Until Discontinued, Normal      CONTINUE these medications which have NOT CHANGED   Details  albuterol (PROVENTIL) (2.5 MG/3ML) 0.083% nebulizer solution Take 3 mLs (2.5 mg total) by nebulization every 6 (six) hours as needed for wheezing or shortness of breath., Starting 06/02/2015, Until Discontinued, Print    beclomethasone (QVAR) 80 MCG/ACT inhaler Inhale 1 puff into the lungs 2 (two) times daily., Starting 07/09/2012, Until Discontinued, Print      STOP taking these medications     metoCLOPramide (REGLAN) 10 MG tablet      predniSONE (DELTASONE) 20 MG  tablet        Allergies  Allergen Reactions  . Shrimp [Shellfish Allergy] Anaphylaxis    The results of significant diagnostics from this hospitalization (including imaging, microbiology, ancillary and laboratory) are listed below for reference.       Labs: Basic Metabolic Panel:  Recent Labs Lab 08/28/15 2152 08/29/15 0758  NA 134* 136    K 3.7 3.9  CL 102 108  CO2 25 24  GLUCOSE 82 80  BUN 11 6  CREATININE 0.61 0.55  CALCIUM 9.2 8.3*   Liver Function Tests:  Recent Labs Lab 08/28/15 2152 08/29/15 0758  AST 13* 10*  ALT 7* 7*  ALKPHOS 42 34*  BILITOT 0.4 0.8  PROT 7.0 5.5*  ALBUMIN 3.6 2.8*    Recent Labs Lab 08/28/15 2152  LIPASE 24    CBC:  Recent Labs Lab 08/28/15 2152  08/29/15 0758 08/29/15 1540 08/30/15 0020 08/30/15 0749 08/30/15 1021  WBC 5.5  --   --   --   --   --  4.4  NEUTROABS 3.0  --   --   --   --   --   --   HGB 11.5*  < > 9.9* 10.1* 9.5* 9.8* 9.9*  HCT 34.0*  < > 29.1* 30.1* 27.7* 28.9* 29.0*  MCV 89.2  --   --   --   --   --  90.6  PLT 281  --   --   --   --   --  268  < > = values in this interval not displayed.     Principal Problem:   Hyperemesis arising during pregnancy Active Problems:   Upper gastrointestinal bleeding   First trimester pregnancy   Time coordinating discharge: 35 minutes  Signed:  Brendia Sacksaniel Goodrich, MD [facility] 08/30/2015, 7:38 PM

## 2015-08-30 NOTE — Progress Notes (Signed)
Subjective:  No vomiting in 24 hours. No BM in two days. Some llq pain today.   Objective: Vital signs in last 24 hours: Temp:  [97.5 F (36.4 C)-100.4 F (38 C)] 97.5 F (36.4 C) (12/21 0524) Pulse Rate:  [66-84] 66 (12/21 0813) Resp:  [16-20] 17 (12/21 0813) BP: (109-122)/(50-64) 122/64 mmHg (12/21 0524) SpO2:  [99 %-100 %] 100 % (12/21 0813) Last BM Date: 08/28/15 General:   Alert,  Well-developed, well-nourished, pleasant and cooperative in NAD Head:  Normocephalic and atraumatic. Eyes:  Sclera clear, no icterus.   Abdomen:  Soft, nontender and nondistended.   Normal bowel sounds, without guarding, and without rebound.   Extremities:  Without clubbing, deformity or edema. Neurologic:  Alert and  oriented x4;  grossly normal neurologically. Skin:  Intact without significant lesions or rashes. Psych:  Alert and cooperative. Normal mood and affect.  Intake/Output from previous day: 12/20 0701 - 12/21 0700 In: 480 [P.O.:480] Out: -  Intake/Output this shift:    Lab Results: CBC  Recent Labs  08/28/15 2152  08/29/15 1540 08/30/15 0020 08/30/15 0749  WBC 5.5  --   --   --   --   HGB 11.5*  < > 10.1* 9.5* 9.8*  HCT 34.0*  < > 30.1* 27.7* 28.9*  MCV 89.2  --   --   --   --   PLT 281  --   --   --   --   < > = values in this interval not displayed. BMET  Recent Labs  08/28/15 2152 08/29/15 0758  NA 134* 136  K 3.7 3.9  CL 102 108  CO2 25 24  GLUCOSE 82 80  BUN 11 6  CREATININE 0.61 0.55  CALCIUM 9.2 8.3*   LFTs  Recent Labs  08/28/15 2152 08/29/15 0758  BILITOT 0.4 0.8  ALKPHOS 42 34*  AST 13* 10*  ALT 7* 7*  PROT 7.0 5.5*  ALBUMIN 3.6 2.8*    Recent Labs  08/28/15 2152  LIPASE 24   PT/INR No results for input(s): LABPROT, INR in the last 72 hours.    Imaging Studies: No results found.[2 weeks]   Assessment: 19 year old female 11 weeks 6 days pregnant admitted with hematemesis and possible melena, drift in hemoglobin from 11.5-9.9 this  admission. Clinically improved with supportive measures and without any further overt GI bleeding. Suspect acute hematemesis due to MW tear versus esophagitis. Endoscopic evaluation not recommended unless significant overt GI bleeding, ideally procedures would be after 14 weeks per GYN. If endoscopic evaluation becomes necessary, she will need fetal monitoring during procedure.  Plan: 1. PPI twice a day. 2. Carafate while inpatient. 3. Zofran around-the-clock. 4. DC H&H every 6 hours. 5. Hold on endoscopic evaluation currently unless further overt GI bleeding, worsening anemia. 6. We'll continue to follow with you.  Leanna BattlesLeslie S. Dixon BoosLewis, PA-C [facility] [phone] 12/21/20169:58 AM

## 2015-08-30 NOTE — Telephone Encounter (Signed)
[facility]. Pt has not been discharged from hospital to make it to her 11:45 appt this am. Per Dr. Emelda FearFerguson ok to get pt scheduled for our next available New OB appt on 09/12/2014. Patrice given appt date and time (09/12/2014 @ 1:30 pm) to inform pt.

## 2015-08-30 NOTE — Progress Notes (Signed)
Patient states understanding of discharge instructions, prescription given. 

## 2015-08-30 NOTE — Progress Notes (Signed)
Subjective: Patient reports tolerating PO.  abd pain is resolved. Pt was unable to make her 11:45 appt, so will reschedule in Jan.    Objective: I have reviewed patient's vital signs and labs. Prenatal labs all acceptable, the anemia will respond eventually to PNV's with the addition of FeSO4  325 mg daily.    Assessment/Plan: Hyperemesis of pregnancy , controlled.  Agree with d/c today, and rescheduling of prenatal visit to our office in 1-2 wks. Our office will contact pt.      Jasyn Mey V 08/30/2015, 11:57 AM

## 2015-09-10 NOTE — L&D Delivery Note (Signed)
Delivery Note Pt admitted in spontaneous labor, augmentation: arom. Called by RN for pt pushing involuntarily, noted to be 10/100/+1, began to push ineffectively, then better but backed off when +3- fhr 90s w/ still ineffective pushing despite ischial spine pressure- dr. Jolayne Pantheronstant in for assistance- Ritgen's maneuver combined w/ better maternal pushing efforts brought baby to crowning and at 9:17 AM a viable female was delivered by me via  (Presentation: LOP ).  APGAR: 8, 9; weight: pending at time of note.  Vigorous infant placed directly on mom's abdomen for bonding/skin-to-skin. Delayed cord clamping, then cord clamped x 2, and cut by fob.     Placenta status: delivered at almost 30mins w/ maternal pushing efforts, [name].  Cord: 3 vessels with the following complications: None.  Cord pH: pending  Anesthesia:  nitrous Episiotomy:  n/a Lacerations:  none Suture Repair: n/a Est. Blood Loss (mL):  150ml  Mom to postpartum.  Baby to Couplet care / Skin to Skin. Plans to breast/bottlefeed, nexplanon for contraception  Sharon Nash, Sharon Nash 03/02/2016, 9:48 AM

## 2015-09-12 ENCOUNTER — Encounter: Payer: Self-pay | Admitting: Advanced Practice Midwife

## 2015-09-13 ENCOUNTER — Ambulatory Visit (INDEPENDENT_AMBULATORY_CARE_PROVIDER_SITE_OTHER): Payer: Medicaid Other | Admitting: Advanced Practice Midwife

## 2015-09-13 ENCOUNTER — Encounter: Payer: Self-pay | Admitting: Advanced Practice Midwife

## 2015-09-13 VITALS — BP 112/68 | HR 86 | Wt 150.0 lb

## 2015-09-13 DIAGNOSIS — Z0283 Encounter for blood-alcohol and blood-drug test: Secondary | ICD-10-CM

## 2015-09-13 DIAGNOSIS — Z3401 Encounter for supervision of normal first pregnancy, first trimester: Secondary | ICD-10-CM | POA: Diagnosis not present

## 2015-09-13 DIAGNOSIS — Z331 Pregnant state, incidental: Secondary | ICD-10-CM

## 2015-09-13 DIAGNOSIS — Z369 Encounter for antenatal screening, unspecified: Secondary | ICD-10-CM

## 2015-09-13 DIAGNOSIS — Z1389 Encounter for screening for other disorder: Secondary | ICD-10-CM

## 2015-09-13 DIAGNOSIS — Z3490 Encounter for supervision of normal pregnancy, unspecified, unspecified trimester: Secondary | ICD-10-CM | POA: Insufficient documentation

## 2015-09-13 LAB — POCT URINALYSIS DIPSTICK
Glucose, UA: NEGATIVE
Ketones, UA: NEGATIVE
Leukocytes, UA: NEGATIVE
NITRITE UA: POSITIVE
Protein, UA: NEGATIVE
RBC UA: NEGATIVE

## 2015-09-13 NOTE — Patient Instructions (Signed)
Safe Medications in Pregnancy   Acne: Benzoyl Peroxide Salicylic Acid  Backache/Headache: Tylenol: 2 regular strength every 4 hours OR              2 Extra strength every 6 hours  Colds/Coughs/Allergies: Benadryl (alcohol free) 25 mg every 6 hours as needed Breath right strips Claritin Cepacol throat lozenges Chloraseptic throat spray Cold-Eeze- up to three times per day Cough drops, alcohol free Flonase (by prescription only) Guaifenesin Mucinex Robitussin DM (plain only, alcohol free) Saline nasal spray/drops Sudafed (pseudoephedrine) & Actifed ** use only after [redacted] weeks gestation and if you do not have high blood pressure Tylenol Vicks Vaporub Zinc lozenges Zyrtec   Constipation: Colace Ducolax suppositories Fleet enema Glycerin suppositories Metamucil Milk of magnesia Miralax Senokot Smooth move tea  Diarrhea: Kaopectate Imodium A-D  *NO pepto Bismol  Hemorrhoids: Anusol Anusol HC Preparation H Tucks  Indigestion: Tums Maalox Mylanta Zantac  Pepcid  Insomnia: Benadryl (alcohol free) 25mg every 6 hours as needed Tylenol PM Unisom, no Gelcaps  Leg Cramps: Tums MagGel  Nausea/Vomiting:  Bonine Dramamine Emetrol Ginger extract Sea bands Meclizine  Nausea medication to take during pregnancy:  Unisom (doxylamine succinate 25 mg tablets) Take one tablet daily at bedtime. If symptoms are not adequately controlled, the dose can be increased to a maximum recommended dose of two tablets daily (1/2 tablet in the morning, 1/2 tablet mid-afternoon and one at bedtime). Vitamin B6 100mg tablets. Take one tablet twice a day (up to 200 mg per day).  Skin Rashes: Aveeno products Benadryl cream or 25mg every 6 hours as needed Calamine Lotion 1% cortisone cream  Yeast infection: Gyne-lotrimin 7 Monistat 7   **If taking multiple medications, please check labels to avoid duplicating the same active ingredients **take medication as directed on  the label ** Do not exceed 4000 mg of tylenol in 24 hours **Do not take medications that contain aspirin or ibuprofen       Second Trimester of Pregnancy The second trimester is from week 13 through week 28, months 4 through 6. The second trimester is often a time when you feel your best. Your body has also adjusted to being pregnant, and you begin to feel better physically. Usually, morning sickness has lessened or quit completely, you may have more energy, and you may have an increase in appetite. The second trimester is also a time when the fetus is growing rapidly. At the end of the sixth month, the fetus is about 9 inches long and weighs about 1 pounds. You will likely begin to feel the baby move (quickening) between 18 and 20 weeks of the pregnancy. BODY CHANGES Your body goes through many changes during pregnancy. The changes vary from woman to woman.   Your weight will continue to increase. You will notice your lower abdomen bulging out.  You may begin to get stretch marks on your hips, abdomen, and breasts.  You may develop headaches that can be relieved by medicines approved by your health care provider.  You may urinate more often because the fetus is pressing on your bladder.  You may develop or continue to have heartburn as a result of your pregnancy.  You may develop constipation because certain hormones are causing the muscles that push waste through your intestines to slow down.  You may develop hemorrhoids or swollen, bulging veins (varicose veins).  You may have back pain because of the weight gain and pregnancy hormones relaxing your joints between the bones in your pelvis and as   a result of a shift in weight and the muscles that support your balance.  Your breasts will continue to grow and be tender.  Your gums may bleed and may be sensitive to brushing and flossing.  Dark spots or blotches (chloasma, mask of pregnancy) may develop on your face. This will likely  fade after the baby is born.  A dark line from your belly button to the pubic area (linea nigra) may appear. This will likely fade after the baby is born.  You may have changes in your hair. These can include thickening of your hair, rapid growth, and changes in texture. Some women also have hair loss during or after pregnancy, or hair that feels dry or thin. Your hair will most likely return to normal after your baby is born. WHAT TO EXPECT AT YOUR PRENATAL VISITS During a routine prenatal visit:  You will be weighed to make sure you and the fetus are growing normally.  Your blood pressure will be taken.  Your abdomen will be measured to track your baby's growth.  The fetal heartbeat will be listened to.  Any test results from the previous visit will be discussed. Your health care provider may ask you:  How you are feeling.  If you are feeling the baby move.  If you have had any abnormal symptoms, such as leaking fluid, bleeding, severe headaches, or abdominal cramping.  If you are using any tobacco products, including cigarettes, chewing tobacco, and electronic cigarettes.  If you have any questions. Other tests that may be performed during your second trimester include:  Blood tests that check for:  Low iron levels (anemia).  Gestational diabetes (between 24 and 28 weeks).  Rh antibodies.  Urine tests to check for infections, diabetes, or protein in the urine.  An ultrasound to confirm the proper growth and development of the baby.  An amniocentesis to check for possible genetic problems.  Fetal screens for spina bifida and Down syndrome.  HIV (human immunodeficiency virus) testing. Routine prenatal testing includes screening for HIV, unless you choose not to have this test. HOME CARE INSTRUCTIONS   Avoid all smoking, herbs, alcohol, and unprescribed drugs. These chemicals affect the formation and growth of the baby.  Do not use any tobacco products, including  cigarettes, chewing tobacco, and electronic cigarettes. If you need help quitting, ask your health care provider. You may receive counseling support and other resources to help you quit.  Follow your health care provider's instructions regarding medicine use. There are medicines that are either safe or unsafe to take during pregnancy.  Exercise only as directed by your health care provider. Experiencing uterine cramps is a good sign to stop exercising.  Continue to eat regular, healthy meals.  Wear a good support bra for breast tenderness.  Do not use hot tubs, steam rooms, or saunas.  Wear your seat belt at all times when driving.  Avoid raw meat, uncooked cheese, cat litter boxes, and soil used by cats. These carry germs that can cause birth defects in the baby.  Take your prenatal vitamins.  Take 1500-2000 mg of calcium daily starting at the 20th week of pregnancy until you deliver your baby.  Try taking a stool softener (if your health care provider approves) if you develop constipation. Eat more high-fiber foods, such as fresh vegetables or fruit and whole grains. Drink plenty of fluids to keep your urine clear or pale yellow.  Take warm sitz baths to soothe any pain or discomfort caused by   hemorrhoids. Use hemorrhoid cream if your health care provider approves.  If you develop varicose veins, wear support hose. Elevate your feet for 15 minutes, 3-4 times a day. Limit salt in your diet.  Avoid heavy lifting, wear low heel shoes, and practice good posture.  Rest with your legs elevated if you have leg cramps or low back pain.  Visit your dentist if you have not gone yet during your pregnancy. Use a soft toothbrush to brush your teeth and be gentle when you floss.  A sexual relationship may be continued unless your health care provider directs you otherwise.  Continue to go to all your prenatal visits as directed by your health care provider. SEEK MEDICAL CARE IF:   You have  dizziness.  You have mild pelvic cramps, pelvic pressure, or nagging pain in the abdominal area.  You have persistent nausea, vomiting, or diarrhea.  You have a bad smelling vaginal discharge.  You have pain with urination. SEEK IMMEDIATE MEDICAL CARE IF:   You have a fever.  You are leaking fluid from your vagina.  You have spotting or bleeding from your vagina.  You have severe abdominal cramping or pain.  You have rapid weight gain or loss.  You have shortness of breath with chest pain.  You notice sudden or extreme swelling of your face, hands, ankles, feet, or legs.  You have not felt your baby move in over an hour.  You have severe headaches that do not go away with medicine.  You have vision changes.   This information is not intended to replace advice given to you by your health care provider. Make sure you discuss any questions you have with your health care provider.   Document Released: 08/20/2001 Document Revised: 09/16/2014 Document Reviewed: 10/27/2012 Elsevier Interactive Patient Education 2016 [employer]  

## 2015-09-13 NOTE — Progress Notes (Signed)
  Subjective:    Sharon Nash is a G2P0 26104w6d being seen today for her first obstetrical visit.  Her obstetrical history is significant for uncomplicated SVD 2012.  Pregnancy history fully reviewed.  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  She stablized and did not get an endoscopy.  She was dc'd on antiemetics and PPH, and her nausea and vomiting has  improved.  She ran out of meds and had some vomiting with "a little blood" in it.  Saw GI MD yesterday and meds were refilled. Uses an inhaler fairly regularly for asthma. Denies UTI sx.  [name].  Filed Vitals:   09/13/15 1359  BP: 112/68  Pulse: 86  Weight: 150 lb (68.04 kg)    HISTORY: OB History  Gravida Para Term Preterm AB SAB TAB Ectopic Multiple Living  2 1 1  0 0 0 0 0 0 1    # Outcome Date GA Lbr Len/2nd Weight Sex Delivery Anes PTL Lv  2 Current           1 Term 02/19/11 8132w0d  7 lb 6 oz (3.345 kg) M Vag-Spont  N Y     Past Medical History  Diagnosis Date  . Asthma   . Respiratory failure, acute (HCC) 06/2012    bipap only, not intubated, due to asthma exacerbation  . Chronic abdominal pain   . Chronic knee pain    Past Surgical History  Procedure Laterality Date  . None     Family History  Problem Relation Age of Onset  . Asthma Sister   . Diabetes Maternal Grandmother   . Hypertension Maternal Grandmother   . Diabetes Maternal Grandfather   . Asthma Maternal Grandfather   . Colon cancer Neg Hx   . Breast cancer Maternal Aunt      Exam                                      System:     Skin: normal coloration and turgor, no rashes    Neurologic: oriented, normal, normal mood   Extremities: normal strength, tone, and muscle mass   HEENT PERRLA   Mouth/Teeth mucous membranes moist, normal dentition   Neck supple and no masses   Cardiovascular: regular rate and rhythm   Respiratory:  appears well, vitals normal, no respiratory distress, acyanotic   Abdomen:  soft, non-tender;  FHR: 150          Assessment:    Pregnancy: G2P0 Patient Active Problem List   Diagnosis Date Noted  . Supervision of normal first pregnancy 09/13/2015  . Hyperemesis arising during pregnancy 08/30/2015  . First trimester pregnancy   . Upper gastrointestinal bleeding 08/28/2015  . Status asthmaticus 07/06/2012  . Acute respiratory failure (HCC) 07/06/2012  . Asthma exacerbation 07/05/2012        Plan:     Initial labs drawn. Continue prenatal vitamins  Problem list reviewed and updated  Culture urine-will rx abx if + Could add zofran if needed; pt declines right now.  Reviewed recommended weight gain based on pre-gravid BMI  Encouraged well-balanced diet Genetic Screening discussed Quad Screen: requested.  Ultrasound discussed; fetal survey: requested.  Return in about 3 weeks (around 10/04/2015) for LROB.  CRESENZO-DISHMAN,Sharon Nash 09/13/2015

## 2015-09-14 LAB — AB SCR+ANTIBODY ID

## 2015-09-25 LAB — MICROSCOPIC EXAMINATION: Casts: NONE SEEN /lpf

## 2015-09-25 LAB — CBC
HEMATOCRIT: 34.8 % (ref 34.0–46.6)
HEMOGLOBIN: 11.3 g/dL (ref 11.1–15.9)
MCH: 29.7 pg (ref 26.6–33.0)
MCHC: 32.5 g/dL (ref 31.5–35.7)
MCV: 92 fL (ref 79–97)
Platelets: 313 10*3/uL (ref 150–379)
RBC: 3.8 x10E6/uL (ref 3.77–5.28)
RDW: 13.5 % (ref 12.3–15.4)
WBC: 5.4 10*3/uL (ref 3.4–10.8)

## 2015-09-25 LAB — PMP SCREEN PROFILE (10S), URINE
Amphetamine Screen, Ur: NEGATIVE ng/mL
BARBITURATE SCRN UR: NEGATIVE ng/mL
BENZODIAZEPINE SCREEN, URINE: NEGATIVE ng/mL
CANNABINOIDS UR QL SCN: POSITIVE ng/mL
CREATININE(CRT), U: 127 mg/dL (ref 20.0–300.0)
Cocaine(Metab.)Screen, Urine: NEGATIVE ng/mL
Methadone Scn, Ur: NEGATIVE ng/mL
Opiate Scrn, Ur: NEGATIVE ng/mL
Oxycodone+Oxymorphone Ur Ql Scn: NEGATIVE ng/mL
PCP Scrn, Ur: NEGATIVE ng/mL
PROPOXYPHENE SCREEN: NEGATIVE ng/mL
Ph of Urine: 6.7 (ref 4.5–8.9)

## 2015-09-25 LAB — ANTIBODY SCREEN

## 2015-09-25 LAB — URINALYSIS, ROUTINE W REFLEX MICROSCOPIC
BILIRUBIN UA: NEGATIVE
Glucose, UA: NEGATIVE
KETONES UA: NEGATIVE
Leukocytes, UA: NEGATIVE
NITRITE UA: POSITIVE — AB
PH UA: 7 (ref 5.0–7.5)
Protein, UA: NEGATIVE
RBC UA: NEGATIVE
SPEC GRAV UA: 1.018 (ref 1.005–1.030)
Urobilinogen, Ur: 0.2 mg/dL (ref 0.2–1.0)

## 2015-09-25 LAB — VARICELLA ZOSTER ANTIBODY, IGG

## 2015-09-25 LAB — SICKLE CELL SCREEN: Sickle Cell Screen: NEGATIVE

## 2015-09-25 LAB — CYSTIC FIBROSIS MUTATION 97: Interpretation: NOT DETECTED

## 2015-09-25 LAB — AFP, QUAD SCREEN

## 2015-09-25 LAB — HIV ANTIBODY (ROUTINE TESTING W REFLEX): HIV Screen 4th Generation wRfx: NONREACTIVE

## 2015-09-25 LAB — HEPATITIS B SURFACE ANTIGEN: Hepatitis B Surface Ag: NEGATIVE

## 2015-10-04 ENCOUNTER — Encounter: Payer: Self-pay | Admitting: Women's Health

## 2015-10-04 ENCOUNTER — Ambulatory Visit (INDEPENDENT_AMBULATORY_CARE_PROVIDER_SITE_OTHER): Payer: Medicaid Other | Admitting: Women's Health

## 2015-10-04 VITALS — BP 102/56 | HR 88 | Wt 153.0 lb

## 2015-10-04 DIAGNOSIS — Z1159 Encounter for screening for other viral diseases: Secondary | ICD-10-CM

## 2015-10-04 DIAGNOSIS — Z331 Pregnant state, incidental: Secondary | ICD-10-CM

## 2015-10-04 DIAGNOSIS — Z369 Encounter for antenatal screening, unspecified: Secondary | ICD-10-CM

## 2015-10-04 DIAGNOSIS — Z363 Encounter for antenatal screening for malformations: Secondary | ICD-10-CM

## 2015-10-04 DIAGNOSIS — Z1389 Encounter for screening for other disorder: Secondary | ICD-10-CM

## 2015-10-04 DIAGNOSIS — Z3682 Encounter for antenatal screening for nuchal translucency: Secondary | ICD-10-CM

## 2015-10-04 DIAGNOSIS — Z118 Encounter for screening for other infectious and parasitic diseases: Secondary | ICD-10-CM

## 2015-10-04 DIAGNOSIS — Z3492 Encounter for supervision of normal pregnancy, unspecified, second trimester: Secondary | ICD-10-CM

## 2015-10-04 LAB — POCT URINALYSIS DIPSTICK
Blood, UA: NEGATIVE
GLUCOSE UA: NEGATIVE
Ketones, UA: NEGATIVE
LEUKOCYTES UA: NEGATIVE
NITRITE UA: NEGATIVE

## 2015-10-04 NOTE — Patient Instructions (Addendum)
For your lower back pain you may:  Purchase a pregnancy belt from [employer], [employer], [employer], etc and wear it while you are up and about  Take warm baths  Use a heating pad to your lower back for no longer than 20 minutes at a time, and do not place near abdomen  Take tylenol as needed. Please follow directions on the bottle  Second Trimester of Pregnancy The second trimester is from week 13 through week 28, months 4 through 6. The second trimester is often a time when you feel your best. Your body has also adjusted to being pregnant, and you begin to feel better physically. Usually, morning sickness has lessened or quit completely, you may have more energy, and you may have an increase in appetite. The second trimester is also a time when the fetus is growing rapidly. At the end of the sixth month, the fetus is about 9 inches long and weighs about 1 pounds. You will likely begin to feel the baby move (quickening) between 18 and 20 weeks of the pregnancy. BODY CHANGES Your body goes through many changes during pregnancy. The changes vary from woman to woman.   Your weight will continue to increase. You will notice your lower abdomen bulging out.  You may begin to get stretch marks on your hips, abdomen, and breasts.  You may develop headaches that can be relieved by medicines approved by your health care provider.  You may urinate more often because the fetus is pressing on your bladder.  You may develop or continue to have heartburn as a result of your pregnancy.  You may develop constipation because certain hormones are causing the muscles that push waste through your intestines to slow down.  You may develop hemorrhoids or swollen, bulging veins (varicose veins).  You may have back pain because of the weight gain and pregnancy hormones relaxing your joints between the bones in your pelvis and as a result of a shift in weight and the muscles that support your  balance.  Your breasts will continue to grow and be tender.  Your gums may bleed and may be sensitive to brushing and flossing.  Dark spots or blotches (chloasma, mask of pregnancy) may develop on your face. This will likely fade after the baby is born.  A dark line from your belly button to the pubic area (linea nigra) may appear. This will likely fade after the baby is born.  You may have changes in your hair. These can include thickening of your hair, rapid growth, and changes in texture. Some women also have hair loss during or after pregnancy, or hair that feels dry or thin. Your hair will most likely return to normal after your baby is born. WHAT TO EXPECT AT YOUR PRENATAL VISITS During a routine prenatal visit:  You will be weighed to make sure you and the fetus are growing normally.  Your blood pressure will be taken.  Your abdomen will be measured to track your baby's growth.  The fetal heartbeat will be listened to.  Any test results from the previous visit will be discussed. Your health care provider may ask you:  How you are feeling.  If you are feeling the baby move.  If you have had any abnormal symptoms, such as leaking fluid, bleeding, severe headaches, or abdominal cramping.  If you are using any tobacco products, including cigarettes, chewing tobacco, and electronic cigarettes.  If you have any questions. Other tests that may be   performed during your second trimester include:  Blood tests that check for:  Low iron levels (anemia).  Gestational diabetes (between 24 and 28 weeks).  Rh antibodies.  Urine tests to check for infections, diabetes, or protein in the urine.  An ultrasound to confirm the proper growth and development of the baby.  An amniocentesis to check for possible genetic problems.  Fetal screens for spina bifida and Down syndrome.  HIV (human immunodeficiency virus) testing. Routine prenatal testing includes screening for HIV, unless  you choose not to have this test. HOME CARE INSTRUCTIONS  5. Avoid all smoking, herbs, alcohol, and unprescribed drugs. These chemicals affect the formation and growth of the baby. 6. Do not use any tobacco products, including cigarettes, chewing tobacco, and electronic cigarettes. If you need help quitting, ask your health care provider. You may receive counseling support and other resources to help you quit. 7. Follow your health care provider's instructions regarding medicine use. There are medicines that are either safe or unsafe to take during pregnancy. 8. Exercise only as directed by your health care provider. Experiencing uterine cramps is a good sign to stop exercising. 9. Continue to eat regular, healthy meals. 10. Wear a good support bra for breast tenderness. 11. Do not use hot tubs, steam rooms, or saunas. 12. Wear your seat belt at all times when driving. 13. Avoid raw meat, uncooked cheese, cat litter boxes, and soil used by cats. These carry germs that can cause birth defects in the baby. 14. Take your prenatal vitamins. 15. Take 1500-2000 mg of calcium daily starting at the 20th week of pregnancy until you deliver your baby. 16. Try taking a stool softener (if your health care provider approves) if you develop constipation. Eat more high-fiber foods, such as fresh vegetables or fruit and whole grains. Drink plenty of fluids to keep your urine clear or pale yellow. 17. Take warm sitz baths to soothe any pain or discomfort caused by hemorrhoids. Use hemorrhoid cream if your health care provider approves. 18. If you develop varicose veins, wear support hose. Elevate your feet for 15 minutes, 3-4 times a day. Limit salt in your diet. 19. Avoid heavy lifting, wear low heel shoes, and practice good posture. 20. Rest with your legs elevated if you have leg cramps or low back pain. 21. Visit your dentist if you have not gone yet during your pregnancy. Use a soft toothbrush to brush your  teeth and be gentle when you floss. 22. A sexual relationship may be continued unless your health care provider directs you otherwise. 23. Continue to go to all your prenatal visits as directed by your health care provider. SEEK MEDICAL CARE IF:   You have dizziness.  You have mild pelvic cramps, pelvic pressure, or nagging pain in the abdominal area.  You have persistent nausea, vomiting, or diarrhea.  You have a bad smelling vaginal discharge.  You have pain with urination. SEEK IMMEDIATE MEDICAL CARE IF:   You have a fever.  You are leaking fluid from your vagina.  You have spotting or bleeding from your vagina.  You have severe abdominal cramping or pain.  You have rapid weight gain or loss.  You have shortness of breath with chest pain.  You notice sudden or extreme swelling of your face, hands, ankles, feet, or legs.  You have not felt your baby move in over an hour.  You have severe headaches that do not go away with medicine.  You have vision changes.  This information is not intended to replace advice given to you by your health care provider. Make sure you discuss any questions you have with your health care provider.   Document Released: 08/20/2001 Document Revised: 09/16/2014 Document Reviewed: 10/27/2012 Elsevier Interactive Patient Education [employer]

## 2015-10-04 NOTE — Progress Notes (Signed)
Low-risk OB appointment G2P1001 [redacted]w[redacted]d Estimated Date of Delivery: 03/14/16 BP 102/56 mmHg  Pulse 88  Wt 153 lb (69.4 kg)  LMP 05/12/2015  BP, weight, and urine reviewed.  Refer to obstetrical flow sheet for FH & FHR.  Some flutters. Denies cramping, lof, vb, or uti s/s. No complaints. Feeling much better since all of sickness earlier in pregnancy.  Reviewed warning s/s to report. Plan:  Continue routine obstetrical care  F/U in 4wks for OB appointment w/ pap (will have turned 20yo) and anatomy u/s AFP today

## 2015-10-06 LAB — URINE CULTURE

## 2015-10-07 LAB — AFP, QUAD SCREEN
DIA Mom Value: 1
DIA VALUE (EIA): 169.96 pg/mL
DSR (By Age)    1 IN: 1141
DSR (SECOND TRIMESTER) 1 IN: 10000
GESTATIONAL AGE AFP: 16.9 wk
MSAFP MOM: 0.56
MSAFP: 19.8 ng/mL
MSHCG MOM: 0.53
MSHCG: 17457 m[IU]/mL
Maternal Age At EDD: 21.4 YEARS
Osb Risk: 10000
PDF: 0
Test Results:: NEGATIVE
UE3 MOM: 0.92
WEIGHT: 153 [lb_av]
uE3 Value: 0.98 ng/mL

## 2015-10-07 LAB — GC/CHLAMYDIA PROBE AMP
CHLAMYDIA, DNA PROBE: NEGATIVE
Neisseria gonorrhoeae by PCR: NEGATIVE

## 2015-10-16 ENCOUNTER — Telehealth: Payer: Self-pay | Admitting: Women's Health

## 2015-10-16 NOTE — Telephone Encounter (Signed)
Pt requesting a rx for albuterol inhaler.

## 2015-10-17 MED ORDER — ALBUTEROL SULFATE HFA 108 (90 BASE) MCG/ACT IN AERS: 2.0000 | INHALATION_SPRAY | Freq: Four times a day (QID) | RESPIRATORY_TRACT | Status: DC | PRN

## 2015-11-01 ENCOUNTER — Encounter: Payer: Self-pay | Admitting: Obstetrics & Gynecology

## 2015-11-01 ENCOUNTER — Other Ambulatory Visit (HOSPITAL_COMMUNITY)
Admission: RE | Admit: 2015-11-01 | Discharge: 2015-11-01 | Disposition: A | Discharge status: Home / Self Care | Payer: Medicaid Other | Source: Ambulatory Visit | Attending: Obstetrics & Gynecology | Admitting: Obstetrics & Gynecology

## 2015-11-01 ENCOUNTER — Ambulatory Visit (INDEPENDENT_AMBULATORY_CARE_PROVIDER_SITE_OTHER): Payer: Medicaid Other

## 2015-11-01 ENCOUNTER — Ambulatory Visit (INDEPENDENT_AMBULATORY_CARE_PROVIDER_SITE_OTHER): Payer: Medicaid Other | Admitting: Obstetrics & Gynecology

## 2015-11-01 VITALS — BP 120/60 | HR 76 | Wt 162.0 lb

## 2015-11-01 DIAGNOSIS — Z3492 Encounter for supervision of normal pregnancy, unspecified, second trimester: Secondary | ICD-10-CM

## 2015-11-01 DIAGNOSIS — Z3A21 21 weeks gestation of pregnancy: Secondary | ICD-10-CM

## 2015-11-01 DIAGNOSIS — Z1389 Encounter for screening for other disorder: Secondary | ICD-10-CM

## 2015-11-01 DIAGNOSIS — Z01419 Encounter for gynecological examination (general) (routine) without abnormal findings: Secondary | ICD-10-CM | POA: Insufficient documentation

## 2015-11-01 DIAGNOSIS — Z124 Encounter for screening for malignant neoplasm of cervix: Secondary | ICD-10-CM

## 2015-11-01 DIAGNOSIS — Z331 Pregnant state, incidental: Secondary | ICD-10-CM

## 2015-11-01 DIAGNOSIS — Z36 Encounter for antenatal screening of mother: Secondary | ICD-10-CM | POA: Diagnosis not present

## 2015-11-01 DIAGNOSIS — Z363 Encounter for antenatal screening for malformations: Secondary | ICD-10-CM

## 2015-11-01 LAB — POCT URINALYSIS DIPSTICK
Blood, UA: NEGATIVE
Glucose, UA: NEGATIVE
KETONES UA: NEGATIVE
Leukocytes, UA: NEGATIVE
Nitrite, UA: NEGATIVE
PROTEIN UA: NEGATIVE

## 2015-11-01 NOTE — Progress Notes (Signed)
[place] 20+6 wks,cephalic,post pl gr 0,cx 3.4cm,svp of fluid 5 cm,normal ov's bilat,fhr 152 bpm,efw 389 g,anatomy complete, no obvious abn seen

## 2015-11-01 NOTE — Progress Notes (Signed)
G2P1001 [redacted]w[redacted]d Estimated Date of Delivery: 03/14/16  Blood pressure 120/60, pulse 76, weight 162 lb (73.483 kg), last menstrual period 05/12/2015.   BP weight and urine results all reviewed and noted.  Please refer to the obstetrical flow sheet for the fundal height and fetal heart rate documentation:  Patient reports good fetal movement, denies any bleeding and no rupture of membranes symptoms or regular contractions. Patient is without complaints. All questions were answered.  Orders Placed This Encounter  Procedures  . POCT urinalysis dipstick    Plan:  Continued routine obstetrical care, sonogram is normal  No Follow-up on file.

## 2015-11-01 NOTE — Addendum Note (Signed)
Addended by: Federico Flake A on: 11/01/2015 03:03 PM   Modules accepted: Orders

## 2015-11-03 ENCOUNTER — Emergency Department (HOSPITAL_COMMUNITY)
Admission: EM | Admit: 2015-11-03 | Discharge: 2015-11-03 | Disposition: A | Discharge status: Home / Self Care | Payer: Medicaid Other | Attending: Emergency Medicine | Admitting: Emergency Medicine

## 2015-11-03 ENCOUNTER — Encounter (HOSPITAL_COMMUNITY): Payer: Self-pay | Admitting: *Deleted

## 2015-11-03 DIAGNOSIS — O99512 Diseases of the respiratory system complicating pregnancy, second trimester: Secondary | ICD-10-CM | POA: Insufficient documentation

## 2015-11-03 DIAGNOSIS — O99352 Diseases of the nervous system complicating pregnancy, second trimester: Secondary | ICD-10-CM | POA: Diagnosis not present

## 2015-11-03 DIAGNOSIS — R600 Localized edema: Secondary | ICD-10-CM | POA: Insufficient documentation

## 2015-11-03 DIAGNOSIS — Z3A21 21 weeks gestation of pregnancy: Secondary | ICD-10-CM | POA: Diagnosis not present

## 2015-11-03 DIAGNOSIS — Z79899 Other long term (current) drug therapy: Secondary | ICD-10-CM | POA: Diagnosis not present

## 2015-11-03 DIAGNOSIS — G8929 Other chronic pain: Secondary | ICD-10-CM | POA: Diagnosis not present

## 2015-11-03 DIAGNOSIS — O9989 Other specified diseases and conditions complicating pregnancy, childbirth and the puerperium: Secondary | ICD-10-CM | POA: Insufficient documentation

## 2015-11-03 DIAGNOSIS — J45901 Unspecified asthma with (acute) exacerbation: Secondary | ICD-10-CM

## 2015-11-03 LAB — COMPREHENSIVE METABOLIC PANEL
ALBUMIN: 2.8 g/dL — AB (ref 3.5–5.0)
ALK PHOS: 42 U/L (ref 38–126)
ALT: 8 U/L — AB (ref 14–54)
AST: 13 U/L — AB (ref 15–41)
Anion gap: 6 (ref 5–15)
BUN: 13 mg/dL (ref 6–20)
CALCIUM: 8.4 mg/dL — AB (ref 8.9–10.3)
CO2: 23 mmol/L (ref 22–32)
CREATININE: 0.56 mg/dL (ref 0.44–1.00)
Chloride: 107 mmol/L (ref 101–111)
GFR calc Af Amer: >60 mL/min (ref >60)
GFR calc non Af Amer: >60 mL/min (ref >60)
GLUCOSE: 88 mg/dL (ref 65–99)
Potassium: 3.9 mmol/L (ref 3.5–5.1)
SODIUM: 136 mmol/L (ref 135–145)
Total Bilirubin: <0.1 mg/dL — ABNORMAL LOW (ref 0.3–1.2)
Total Protein: 6.1 g/dL — ABNORMAL LOW (ref 6.5–8.1)

## 2015-11-03 LAB — CBC WITH DIFFERENTIAL/PLATELET
BASOS PCT: 0 %
Basophils Absolute: 0 10*3/uL (ref 0.0–0.1)
Eosinophils Absolute: 0.2 10*3/uL (ref 0.0–0.7)
Eosinophils Relative: 4 %
HEMATOCRIT: 29.2 % — AB (ref 36.0–46.0)
Hemoglobin: 9.6 g/dL — ABNORMAL LOW (ref 12.0–15.0)
Lymphocytes Relative: 28 %
Lymphs Abs: 1.5 10*3/uL (ref 0.7–4.0)
MCH: 31 pg (ref 26.0–34.0)
MCHC: 32.9 g/dL (ref 30.0–36.0)
MCV: 94.2 fL (ref 78.0–100.0)
MONO ABS: 0.5 10*3/uL (ref 0.1–1.0)
MONOS PCT: 9 %
NEUTROS ABS: 3 10*3/uL (ref 1.7–7.7)
Neutrophils Relative %: 59 %
Platelets: 257 10*3/uL (ref 150–400)
RBC: 3.1 MIL/uL — ABNORMAL LOW (ref 3.87–5.11)
RDW: 13.3 % (ref 11.5–15.5)
WBC: 5.2 10*3/uL (ref 4.0–10.5)

## 2015-11-03 LAB — URINALYSIS, ROUTINE W REFLEX MICROSCOPIC
Bilirubin Urine: NEGATIVE
Glucose, UA: NEGATIVE mg/dL
Hgb urine dipstick: NEGATIVE
Ketones, ur: NEGATIVE mg/dL
Leukocytes, UA: NEGATIVE
NITRITE: NEGATIVE
Protein, ur: NEGATIVE mg/dL
SPECIFIC GRAVITY, URINE: 1.015 (ref 1.005–1.030)
pH: 7 (ref 5.0–8.0)

## 2015-11-03 MED ORDER — PREDNISONE 50 MG PO TABS
60.0000 mg | ORAL_TABLET | Freq: Once | ORAL | Status: AC
  Administered 2015-11-03: 60 mg via ORAL
  Filled 2015-11-03: qty 1

## 2015-11-03 MED ORDER — PREDNISONE 20 MG PO TABS: 40.0000 mg | ORAL_TABLET | Freq: Every day | ORAL | Status: DC

## 2015-11-03 MED ORDER — ALBUTEROL SULFATE HFA 108 (90 BASE) MCG/ACT IN AERS: 2.0000 | INHALATION_SPRAY | RESPIRATORY_TRACT | Status: DC | PRN

## 2015-11-03 MED ORDER — ALBUTEROL SULFATE (2.5 MG/3ML) 0.083% IN NEBU: 2.5000 mg | INHALATION_SOLUTION | RESPIRATORY_TRACT | Status: DC | PRN

## 2015-11-03 MED ORDER — ALBUTEROL SULFATE (2.5 MG/3ML) 0.083% IN NEBU
2.5000 mg | INHALATION_SOLUTION | Freq: Once | RESPIRATORY_TRACT | Status: AC
  Administered 2015-11-03: 2.5 mg via RESPIRATORY_TRACT
  Filled 2015-11-03: qty 3

## 2015-11-03 MED ORDER — IPRATROPIUM-ALBUTEROL 0.5-2.5 (3) MG/3ML IN SOLN
3.0000 mL | Freq: Once | RESPIRATORY_TRACT | Status: AC
  Administered 2015-11-03: 3 mL via RESPIRATORY_TRACT
  Filled 2015-11-03: qty 3

## 2015-11-03 NOTE — ED Notes (Signed)
Pt unable to provide sample at this time.

## 2015-11-03 NOTE — Discharge Instructions (Signed)
°Emergency Department Resource Guide °1) Find a Doctor and Pay Out of Pocket °Although you won't have to find out who is covered by your insurance plan, it is a good idea to ask around and get recommendations. You will then need to call the office and see if the doctor you have chosen will accept you as a new patient and what types of options they offer for patients who are self-pay. Some doctors offer discounts or will set up payment plans for their patients who do not have insurance, but you will need to ask so you aren't surprised when you get to your appointment. ° °2) Contact Your Local Health Department °Not all health departments have doctors that can see patients for sick visits, but many do, so it is worth a call to see if yours does. If you don't know where your local health department is, you can check in your phone book. The CDC also has a tool to help you locate your state's health department, and many state websites also have listings of all of their local health departments. ° °3) Find a Walk-in Clinic °If your illness is not likely to be very severe or complicated, you may want to try a walk in clinic. These are popping up all over the country in pharmacies, drugstores, and shopping centers. They're usually staffed by nurse practitioners or physician assistants that have been trained to treat common illnesses and complaints. They're usually fairly quick and inexpensive. However, if you have serious medical issues or chronic medical problems, these are probably not your best option. ° °No Primary Care Doctor: °- Call [facility] at  832-8000 - they can help you locate a primary care doctor that  accepts your insurance, provides certain services, etc. °- Physician Referral Service- 1-800-533-3463 ° °Chronic Pain Problems: °Organization         Address  Phone   Notes  °[facility]  (336) [phone] Patients need to be referred by their primary care doctor.  ° °Medication  Assistance: °Organization         Address  Phone   Notes  °[place] [facility] [address] °[place], [place] [address] (336) 641-8030 --Must be a resident of [place] °-- Must have NO insurance coverage whatsoever (no Medicaid/ Medicare, etc.) °-- The pt. MUST have a primary care doctor that directs their care regularly and follows them in the community °  °[facility]  (866) 331-1348   °[employer]  (888) 892-1162   ° °Agencies that provide inexpensive medical care: °Organization         Address  Phone   Notes  °[facility]  (336) 832-8035   °[facility]    (336) 832-7272   °[facility] [address] °[place], [place] [address] (336) 832-4777   °[facility] [address], °[place] (336) 271-4999   °[facility]    (336) 373-0678   °[facility]    (336) 272-1050   °[facility] ° [address], [place] Phone:  (336) 832-4444, Fax:  (336) 832-4440 Hours of Operation:  9 am - 6 pm, M-F.  Also accepts Medicaid/Medicare and self-pay.  °[facility] ° [address], [place] Phone: (336) [phone], Fax: (336) [phone]. Hours of Operation:  8:30 am - 5:30 pm, M-F.  Also accepts Medicaid and self-pay.  °[facility] [place] [address], [place] Phone: (336) 878-6027   °[facility] [address], [place], [place] (336)723-1848, Ext. 123 Mondays & Thursdays: 7-9 AM.  First 15 patients are seen on a first come, first serve basis. °  ° °Medicaid-accepting [place] Providers: ° °Organization         Address  Phone   Notes  °[facility] [address], [place] (336) 641-2100 Also accepts self-pay patients.  °[facility] [address], [place] ° (336) 856-9996   °[facility] [address], [place]  (336) 288-8857   °[facility] [address] [place] [address], [place] (336) 299-7000   °Veita Bland [address], [place]  ° (336) 373-1557 Only accepts [place] Access Medicaid patients after they have their name applied to their card.  ° °Self-Pay (no insurance) in [place]: ° °Organization         Address  Phone   Notes  °Sickle Cell Patients, [facility] [address], [place] (336) 832-1970   °[facility] [address], [place] (336) 832-4400   °[facility] ° [address], [place] (336) 992-4800   °[facility]/Dr. Osei-Bonsu ° [address] [place] [address], [place] or [address], [place] (336) [phone] Phone number for both [place] and [place] locations is the same.  °[facility] [address], [place] (336) 299-0000   °[facility] [address] [place] [address], [place] or [address] (336) 852-7530 °(336) 878-2260   °[facility] [address], [place] (336) 350-1642, phone; (336) 294-5005, fax Sees patients 1st and 3rd Saturday of every month.  Must not qualify for public or private insurance (i.e. Medicaid, Medicare, [place] Health Choice, Veterans' Benefits) • Household income should be no more than 200% of the poverty level •The clinic cannot treat you if you are pregnant or think you are pregnant • Sexually transmitted diseases are not treated at the clinic.  ° ° °Dental Care: °Organization         Address  Phone  Notes  °[place] [facility] [address], [place] (336) 641-6152 Accepts children up to age 21 who are enrolled in Medicaid or [place] Health Choice; pregnant women with a Medicaid card; and children who have applied for Medicaid or [place] Health Choice, but were declined, whose parents can pay a reduced fee at time of service.  °[place]  [facility] [place]  [address], [place] (336) 641-7733 Accepts children up to age 21 who are enrolled in Medicaid or [place] Health Choice; pregnant women with a Medicaid card; and children who have applied for Medicaid or [place] Health Choice, but were declined, whose parents can pay a reduced fee at time of service.  °[facility] ° [address], [place] (336) 641-4533 Patients are seen by appointment only. Walk-ins are not accepted. [facility] will see patients 18 years of age and older. °Monday - Tuesday (8am-5pm) °Most Wednesdays (8:30-5pm) °$30 per visit, cash only  °[facility] ° [address], [place] (336) 641-4533 Patients are seen by appointment only. Walk-ins are not accepted. [facility] will see patients 18 years of age and older. °One   Wednesday Evening (Monthly: Volunteer Based).  $30 per visit, cash only  °[facility]  (919) 537-3737 for adults; Children under age 4, call Graduate Pediatric Dentistry at ([phone]) [phone]. Children aged 4-14, please call (919) [phone] to request a pediatric application. ° Dental services are provided in all areas of dental care including fillings, crowns and bridges, complete and partial dentures, implants, gum treatment, root canals, and extractions. Preventive care is also provided. Treatment is provided to both adults and children. °Patients are selected via a lottery and there is often a waiting list. °  °[facility] [address], °[place] ° (336) 763-8833 [email or web address] °  °[facility] [address], [place], [place] (336)723-1848, Ext. 123 Second and Fourth Thursday of each month, opens at 6:30 AM; Clinic ends at 9 AM.  Patients are seen on a first-come first-served basis, and a limited number are seen during each clinic.  ° °[facility] ° [address], [place], [place] (336) [phone]    Eligibility Requirements °You must have lived in [place], or [place] counties for at least the last three months. °  You cannot be eligible for state or federal sponsored healthcare insurance, including Veterans Administration, Medicaid, or Medicare. °  You generally cannot be eligible for healthcare insurance through your employer.  °  How to apply: °Eligibility screenings are held every Tuesday and Wednesday afternoon from 1:00 pm until 4:00 pm. You do not need an appointment for the interview!  °[facility] [address], [place], [place] 336-631-2330   °[facility]  336-342-8273   °[facility]  336-703-3100   °[facility]  336-570-6415   ° °Behavioral Health Resources in the Community: °Intensive Outpatient Programs °Organization         Address  Phone  Notes  °[facility] [address], [place], [place] 336-878-6098   °[facility] [address], [place], [place] 336-832-9800   °[facility] [address], [place], [place] ° 336-882-2125   °[facility] [address],  °[place], [place] 1-800-853-5163 or 336-641-4981   °Substance Abuse Resources °Organization         Address  Phone  Notes  °[facility]  336-882-2125   °[facility]  336-784-9470   °[facility]  336-285-9073   °[facility]  336-845-3988   °Residential & Outpatient Substance Abuse Program  1-800-659-3381   °Psychological Services °Organization         Address  Phone  Notes  °[facility]  336- 832-9600   °[facility]  336- 378-7881   °[facility] [address], [place] 1-800-853-5163 or 336-641-4981   ° °Mobile Crisis Teams °Organization         Address  Phone  Notes  °[facility], Mobile Crisis Care Unit  1-877-626-1772   °Assertive °[facility] ° [address]  [place], [place] 336-834-9664   °Sharon DeEsch [address] °[place] 336-554-5454   ° °Self-Help/Support Groups °Organization         Address  Phone             Notes  °[facility]  - variety of support groups  336- 373-1402 Call for more information  °Narcotics Anonymous (NA), [facility] [address], °[place]  2 meetings at this location  ° °  Residential Treatment Programs Organization         Address  Phone  Notes  [facility] [address]    [place]  9-604-540-9811   [facility]  [address] [place] [address], [place], [place] 956-213-0865   [facility] [address] [place], [place] 784-696-2952 Admissions: 8am-3pm M-F  [facility] [address],    [place], [place] [phone]   [facility] [address] [place], [place], [place] 272-536-6440   [facility] [address],  [place], [place] [phone]   [facility] Intensive Outpatient [address], [place], [place] 875-643-3295   [facility] ([facility] [address] [place]  [place], [place] 1-884-166-0630 or 251-741-9392   [facility] ([facility] [address], [place], [place] 573-220-2542 Accepts Medicaid  [facility] [address]  [place] 7-062-376-2831 Substance Abuse/Addiction Treatment   [facility]         Address  Phone  Notes  [facility]  671-171-9462   Angie Fava, PhD [address] [place]vin Knack [place], [place]   817-562-5876 or (763)633-0538   [facility]   [address] [place], [place] 701-520-2942   [facility] [address] [place], [place] [phone] Insurance/Medicaid/sponsorship through [facility] [address]                                    [place], [place] [phone] Therapy/tele-psych/case    [facility] [address], [place] [phone]    Dr. Lolly Mustache  (606) [phone]   [facility]  [employer] [facility] Dept. 1) [address] [place] 2) [address], [place] 3)  [address], [place] [phone]  [phone]   [facility] Child Abuse Hotline 332-273-7602 or 724-650-1018 (After Hours)      Take the prescriptions as directed.  Use your albuterol inhaler (2 to 4 puffs) or your albuterol nebulizer (1 unit dose) every 4 hours for the next 7 days, then as needed for cough, wheezing, or shortness of breath.  Call your regular OB/GYN doctor Monday morning to schedule a follow up appointment within the next 3 days.  Return to the Emergency Department immediately sooner if worsening.

## 2015-11-03 NOTE — ED Notes (Signed)
Pt given discharge papers, prescriptions, and work note. Pt verbalized understanding of discharge teaching at this time.

## 2015-11-03 NOTE — ED Provider Notes (Signed)
CSN: [id number]     Arrival date & time 11/03/15  1729 History   First MD Initiated Contact with Patient 11/03/15 2055     Chief Complaint  Patient presents with  . Asthma      HPI Pt was seen at 2110.  Per pt, c/o gradual onset and worsening of persistent "wheezing" for the past 3 weeks.  Describes her symptoms as "my asthma is acting up."  Has been using home MDI and nebs with transient relief. States she has run out of both. Pt also c/o "swelling" in her bilat hands and feet for the past several days. Pt states this "swelling" has improved since sitting in the waiting room. Hx G2P1001, EDC 03/14/16, with EGA 21 1/7 weeks.  Denies CP/palpitations, no back pain, no abd pain, no N/V/D, no fevers, no rash, no LOF/vaginal bleeding/vaginal discharge.     Past Medical History  Diagnosis Date  . Asthma   . Respiratory failure, acute (HCC) 06/2012    bipap only, not intubated, due to asthma exacerbation  . Chronic abdominal pain   . Chronic knee pain    Past Surgical History  Procedure Laterality Date  . None     Family History  Problem Relation Age of Onset  . Asthma Sister   . Diabetes Maternal Grandmother   . Hypertension Maternal Grandmother   . Diabetes Maternal Grandfather   . Asthma Maternal Grandfather   . Colon cancer Neg Hx   . Breast cancer Maternal Aunt    Social History  Substance Use Topics  . Smoking status: Never Smoker   . Smokeless tobacco: Never Used  . Alcohol Use: No   OB History    Gravida Para Term Preterm AB TAB SAB Ectopic Multiple Living   2 1 1  0 0 0 0 0 0 1     Review of Systems ROS: Statement: All systems negative except as marked or noted in the HPI; Constitutional: Negative for fever and chills. ; ; Eyes: Negative for eye pain, redness and discharge. ; ; ENMT: Negative for ear pain, hoarseness, nasal congestion, sinus pressure and sore throat. ; ; Cardiovascular: Negative for chest pain, palpitations, diaphoresis, dyspnea and +peripheral edema. ;  ; Respiratory: +wheezing. Negative for cough and stridor. ; ; Gastrointestinal: Negative for nausea, vomiting, diarrhea, abdominal pain, blood in stool, hematemesis, jaundice and rectal bleeding. . ; ; Genitourinary: Negative for dysuria, flank pain and hematuria. ; ; GYN:  No pelvic pain, no vaginal bleeding, no vaginal discharge, no vulvar pain. ;; Musculoskeletal: Negative for back pain and neck pain. Negative for swelling and trauma.; ; Skin: Negative for pruritus, rash, abrasions, blisters, bruising and skin lesion.; ; Neuro: Negative for headache, lightheadedness and neck stiffness. Negative for weakness, altered level of consciousness , altered mental status, extremity weakness, paresthesias, involuntary movement, seizure and syncope.      Allergies  Shrimp  Home Medications   Prior to Admission medications   Medication Sig Start Date End Date Taking? Authorizing Provider  albuterol (PROVENTIL HFA;VENTOLIN HFA) 108 (90 Base) MCG/ACT inhaler Inhale 2 puffs into the lungs every 6 (six) hours as needed for wheezing or shortness of breath. Patient not taking: Reported on 11/01/2015 10/17/15   Cheral Marker, CNM  albuterol (PROVENTIL) (2.5 MG/3ML) 0.083% nebulizer solution Take 3 mLs (2.5 mg total) by nebulization every 6 (six) hours as needed for wheezing or shortness of breath. 06/02/15   Tammy Triplett, PA-C  Prenatal Vit-Fe Fumarate-FA (PRENATAL MULTIVITAMIN) TABS tablet Take 1 tablet  by mouth daily at 12 noon. 08/30/15   Stand[place] Brooking, MD   BP 102/56 mmHg  Pulse 84  Temp(Src) 98.3 F (36.8 C) (Oral)  Resp 16  Ht 5\' 7"  (1.702 m)  Wt 158 lb (71.668 kg)  BMI 24.74 kg/m2  SpO2 97%  LMP 05/12/2015 Physical Exam  2115: Physical examination:  Nursing notes reviewed; Vital signs and O2 SAT reviewed;  Constitutional: Well developed, Well nourished, Well hydrated, In no acute distress; Head:  Normocephalic, atraumatic; Eyes: EOMI, PERRL, No scleral icterus; ENMT: Mouth and pharynx  normal, Mucous membranes moist; Neck: Supple, Full range of motion, No lymphadenopathy; Cardiovascular: Regular rate and rhythm, No murmur, rub, or gallop; Respiratory: Breath sounds coarse & equal bilaterally, No wheezes.  Speaking full sentences with ease, Normal respiratory effort/excursion; Chest: Nontender, Movement normal; Abdomen: +gravid. Soft, Nontender, Nondistended, Normal bowel sounds; Genitourinary: No CVA tenderness; Extremities: Pulses normal, No tenderness, No edema, No calf edema or asymmetry.; Neuro: AA&Ox3, Major CN grossly intact.  Speech clear. No gross focal motor or sensory deficits in extremities.; Skin: Color normal, Warm, Dry.   ED Course  Procedures (including critical care time) Labs Review  Imaging Review  I have personally reviewed and evaluated these images and lab results as part of my medical decision-making.   EKG Interpretation None      MDM  MDM Reviewed: previous chart, nursing note and vitals Reviewed previous: labs Interpretation: labs   Results for orders placed or performed during the hospital encounter of 11/03/15  Urinalysis, Routine w reflex microscopic  Result Value Ref Range   Color, Urine YELLOW YELLOW   APPearance HAZY (A) CLEAR   Specific Gravity, Urine 1.015 1.005 - 1.030   pH 7.0 5.0 - 8.0   Glucose, UA NEGATIVE NEGATIVE mg/dL   Hgb urine dipstick NEGATIVE NEGATIVE   Bilirubin Urine NEGATIVE NEGATIVE   Ketones, ur NEGATIVE NEGATIVE mg/dL   Protein, ur NEGATIVE NEGATIVE mg/dL   Nitrite NEGATIVE NEGATIVE   Leukocytes, UA NEGATIVE NEGATIVE  Comprehensive metabolic panel  Result Value Ref Range   Sodium 136 135 - 145 mmol/L   Potassium 3.9 3.5 - 5.1 mmol/L   Chloride 107 101 - 111 mmol/L   CO2 23 22 - 32 mmol/L   Glucose, Bld 88 65 - 99 mg/dL   BUN 13 6 - 20 mg/dL   Creatinine, Ser 8.11 0.44 - 1.00 mg/dL   Calcium 8.4 (L) 8.9 - 10.3 mg/dL   Total Protein 6.1 (L) 6.5 - 8.1 g/dL   Albumin 2.8 (L) 3.5 - 5.0 g/dL   AST 13 (L)  15 - 41 U/L   ALT 8 (L) 14 - 54 U/L   Alkaline Phosphatase 42 38 - 126 U/L   Total Bilirubin <0.1 (L) 0.3 - 1.2 mg/dL   GFR calc non Af Amer >60 >60 mL/min   GFR calc Af Amer >60 >60 mL/min   Anion gap 6 5 - 15  CBC with Differential  Result Value Ref Range   WBC 5.2 4.0 - 10.5 K/uL   RBC 3.10 (L) 3.87 - 5.11 MIL/uL   Hemoglobin 9.6 (L) 12.0 - 15.0 g/dL   HCT 91.4 (L) 78.2 - 95.6 %   MCV 94.2 78.0 - 100.0 fL   MCH 31.0 26.0 - 34.0 pg   MCHC 32.9 30.0 - 36.0 g/dL   RDW 21.3 08.6 - 57.8 %   Platelets 257 150 - 400 K/uL   Neutrophils Relative % 59 %   Neutro Abs 3.0 1.7 -  7.7 K/uL   Lymphocytes Relative 28 %   Lymphs Abs 1.5 0.7 - 4.0 K/uL   Monocytes Relative 9 %   Monocytes Absolute 0.5 0.1 - 1.0 K/uL   Eosinophils Relative 4 %   Eosinophils Absolute 0.2 0.0 - 0.7 K/uL   Basophils Relative 0 %   Basophils Absolute 0.0 0.0 - 0.1 K/uL   US Ob Comp + 14 Wk 11/01/2015   SECOND TRIMESTER SONOGRAM DEMIA VIERA is in the office for second trimester sonogram. She is a 20 y.o. year old G40P1001 with Estimated Date of Delivery: 03/14/16 by early ultrasound now at  [redacted]w[redacted]d weeks gestation. Thus far the pregnancy has been uncomplicated. GESTATION: SINGLETON PRESENTATION: cephalic FETAL ACTIVITY:          Heart rate         152          The fetus is active. AMNIOTIC FLUID: The amniotic fluid volume is  normal, SDP : 5 cm. PLACENTA LOCALIZATION:  posterior GRADE 0 CERVIX: Measures 3.4 cm ADNEXA: The ovaries are normal. GESTATIONAL AGE AND  BIOMETRICS: Gestational criteria: Estimated Date of Delivery: 03/14/16 by early ultrasound now at [redacted]w[redacted]d Previous Scans:1          BIPARIETAL DIAMETER           4.95 cm         21 weeks HEAD CIRCUMFERENCE           18.61 cm         21 weeks ABDOMINAL CIRCUMFERENCE           16.10 cm         21+1 weeks FEMUR LENGTH           3.37 cm         20+4 weeks                                                       AVERAGE EGA(BY THIS SCAN):  20+6 weeks                                                  ESTIMATED FETAL WEIGHT:       389  grams ANATOMICAL SURVEY                                                                            COMMENTS CEREBRAL VENTRICLES yes normal  CHOROID PLEXUS yes normal  CEREBELLUM yes normal  CISTERNA MAGNA yes normal  NUCHAL REGION yes normal  ORBITS yes normal  NASAL BONE yes normal  NOSE/LIP yes normal  FACIAL PROFILE yes normal  4 CHAMBERED HEART yes normal  OUTFLOW TRACTS yes normal  DIAPHRAGM yes normal  STOMACH yes normal  RENAL REGION yes normal  BLADDER yes normal  CORD INSERTION yes normal  3 VESSEL CORD yes normal  SPINE yes normal  ARMS/HANDS yes normal  LEGS/FEET yes  normal  GENITALIA yes normal female     SUSPECTED ABNORMALITIES:  no QUALITY OF SCAN: satisfactory TECHNICIAN COMMENTS: [place] 20+6 wks,cephalic,post pl gr 0,cx 3.4cm,svp of fluid 5 cm,normal ov's bilat,fhr 152 bpm,efw 389 g,anatomy complete, no obvious abn seen A copy of this report including all images has been saved and backed up to a second source for retrieval if needed. All measures and details of the anatomical scan, placentation, fluid volume and pelvic anatomy are contained in that report. Amber Flora Lipps 11/01/2015 1:41 PM Clinical Impression and recommendations: I have reviewed the sonogram results above, combined with the patient's current clinical course, below are my impressions and any appropriate recommendations for management based on the sonographic findings. 1.  G2P1001 Estimated Date of Delivery: 03/14/16 by  early ultrasound and confirmed by today's sonographic dating 2.  Normal fetal sonographic findings, specifically normal detailed anatomical evaluation,      no abnormalities noted 3.  Normal general sonographic findings Recommend routine prenatal care based on this sonogram or as clinically indicated EURE,LUTHER H 11/01/2015 2:05 PM    2315:  Pt c/o "swelling" in her hands and feet. LFT's, platelets normal; Udip without protein, and BP without elevation; doubt  preeclampsia at this time. Short neb and prednisone given for pt's reported persistent "wheezing" for the past 3 weeks. Pt states she "feels better now" and wants to go home. Sats remain 97-100% R/A, resps easy, lungs CTA bilat. Tx symptomatically, f/u OB/GYN. Pt requesting a work note. Dx and testing d/w pt.  Questions answered.  Verb understanding, agreeable to d/c home with outpt f/u.     Samuel Jester, DO 11/07/15 1218

## 2015-11-03 NOTE — ED Notes (Addendum)
Pt c/o difficulty breathing due to her asthma x 3 weeks. Pt has been using Albuterol inhaler at home and says it has been "helping a little bit". Pt is [redacted] weeks pregnant. Pt also c/o swelling in her hands and legs. Dr. Despina Hidden is OB MD. Pt tried to contact Dr. Despina Hidden today but the office closes early on Friday and pt was unable to get in touch with MD. Lungs clear, resp even. NAD noted.

## 2015-11-06 LAB — URINE CULTURE

## 2015-11-06 LAB — CYTOLOGY - PAP

## 2015-11-27 ENCOUNTER — Encounter: Payer: Self-pay | Admitting: Women's Health

## 2015-11-27 ENCOUNTER — Ambulatory Visit (INDEPENDENT_AMBULATORY_CARE_PROVIDER_SITE_OTHER): Payer: Medicaid Other | Admitting: Women's Health

## 2015-11-27 VITALS — BP 104/54 | HR 80 | Wt 167.5 lb

## 2015-11-27 DIAGNOSIS — Z124 Encounter for screening for malignant neoplasm of cervix: Secondary | ICD-10-CM

## 2015-11-27 DIAGNOSIS — Z331 Pregnant state, incidental: Secondary | ICD-10-CM | POA: Diagnosis not present

## 2015-11-27 DIAGNOSIS — N898 Other specified noninflammatory disorders of vagina: Secondary | ICD-10-CM

## 2015-11-27 DIAGNOSIS — O26892 Other specified pregnancy related conditions, second trimester: Secondary | ICD-10-CM

## 2015-11-27 DIAGNOSIS — Z1389 Encounter for screening for other disorder: Secondary | ICD-10-CM

## 2015-11-27 DIAGNOSIS — Z3492 Encounter for supervision of normal pregnancy, unspecified, second trimester: Secondary | ICD-10-CM

## 2015-11-27 LAB — POCT URINALYSIS DIPSTICK
Blood, UA: NEGATIVE
Glucose, UA: NEGATIVE
Ketones, UA: NEGATIVE
LEUKOCYTES UA: NEGATIVE
NITRITE UA: NEGATIVE
PROTEIN UA: NEGATIVE

## 2015-11-27 LAB — POCT WET PREP (WET MOUNT): Clue Cells Wet Prep Whiff POC: POSITIVE

## 2015-11-27 MED ORDER — METRONIDAZOLE 500 MG PO TABS: 500.0000 mg | ORAL_TABLET | Freq: Two times a day (BID) | ORAL | Status: DC

## 2015-11-27 NOTE — Patient Instructions (Addendum)
No sex while taking medicine  You will have your sugar test next visit.  Please do not eat or drink anything after midnight the night before you come, not even water.  You will be here for at least two hours.     Call the office [phone]) or go to [facility] if:  You begin to have strong, frequent contractions  Your water breaks.  Sometimes it is a big gush of fluid, sometimes it is just a trickle that keeps getting your panties wet or running down your legs  You have vaginal bleeding.  It is normal to have a small amount of spotting if your cervix was checked.   You don't feel your baby moving like normal.  If you don't, get you something to eat and drink and lay down and focus on feeling your baby move.   If your baby is still not moving like normal, you should call the office or go to [facility].  Second Trimester of Pregnancy The second trimester is from week 13 through week 28, months 4 through 6. The second trimester is often a time when you feel your best. Your body has also adjusted to being pregnant, and you begin to feel better physically. Usually, morning sickness has lessened or quit completely, you may have more energy, and you may have an increase in appetite. The second trimester is also a time when the fetus is growing rapidly. At the end of the sixth month, the fetus is about 9 inches long and weighs about 1 pounds. You will likely begin to feel the baby move (quickening) between 18 and 20 weeks of the pregnancy. BODY CHANGES Your body goes through many changes during pregnancy. The changes vary from woman to woman.   Your weight will continue to increase. You will notice your lower abdomen bulging out.  You may begin to get stretch marks on your hips, abdomen, and breasts.  You may develop headaches that can be relieved by medicines approved by your health care provider.  You may urinate more often because the fetus is pressing on your bladder.  You may  develop or continue to have heartburn as a result of your pregnancy.  You may develop constipation because certain hormones are causing the muscles that push waste through your intestines to slow down.  You may develop hemorrhoids or swollen, bulging veins (varicose veins).  You may have back pain because of the weight gain and pregnancy hormones relaxing your joints between the bones in your pelvis and as a result of a shift in weight and the muscles that support your balance.  Your breasts will continue to grow and be tender.  Your gums may bleed and may be sensitive to brushing and flossing.  Dark spots or blotches (chloasma, mask of pregnancy) may develop on your face. This will likely fade after the baby is born.  A dark line from your belly button to the pubic area (linea nigra) may appear. This will likely fade after the baby is born.  You may have changes in your hair. These can include thickening of your hair, rapid growth, and changes in texture. Some women also have hair loss during or after pregnancy, or hair that feels dry or thin. Your hair will most likely return to normal after your baby is born. WHAT TO EXPECT AT YOUR PRENATAL VISITS During a routine prenatal visit:  You will be weighed to make sure you and the fetus are growing normally.  Your  blood pressure will be taken.  Your abdomen will be measured to track your baby's growth.  The fetal heartbeat will be listened to.  Any test results from the previous visit will be discussed. Your health care provider may ask you:  How you are feeling.  If you are feeling the baby move.  If you have had any abnormal symptoms, such as leaking fluid, bleeding, severe headaches, or abdominal cramping.  If you have any questions. Other tests that may be performed during your second trimester include:  Blood tests that check for:  Low iron levels (anemia).  Gestational diabetes (between 24 and 28 weeks).  Rh  antibodies.  Urine tests to check for infections, diabetes, or protein in the urine.  An ultrasound to confirm the proper growth and development of the baby.  An amniocentesis to check for possible genetic problems.  Fetal screens for spina bifida and Down syndrome. HOME CARE INSTRUCTIONS   Avoid all smoking, herbs, alcohol, and unprescribed drugs. These chemicals affect the formation and growth of the baby.  Follow your health care provider's instructions regarding medicine use. There are medicines that are either safe or unsafe to take during pregnancy.  Exercise only as directed by your health care provider. Experiencing uterine cramps is a good sign to stop exercising.  Continue to eat regular, healthy meals.  Wear a good support bra for breast tenderness.  Do not use hot tubs, steam rooms, or saunas.  Wear your seat belt at all times when driving.  Avoid raw meat, uncooked cheese, cat litter boxes, and soil used by cats. These carry germs that can cause birth defects in the baby.  Take your prenatal vitamins.  Try taking a stool softener (if your health care provider approves) if you develop constipation. Eat more high-fiber foods, such as fresh vegetables or fruit and whole grains. Drink plenty of fluids to keep your urine clear or pale yellow.  Take warm sitz baths to soothe any pain or discomfort caused by hemorrhoids. Use hemorrhoid cream if your health care provider approves.  If you develop varicose veins, wear support hose. Elevate your feet for 15 minutes, 3-4 times a day. Limit salt in your diet.  Avoid heavy lifting, wear low heel shoes, and practice good posture.  Rest with your legs elevated if you have leg cramps or low back pain.  Visit your dentist if you have not gone yet during your pregnancy. Use a soft toothbrush to brush your teeth and be gentle when you floss.  A sexual relationship may be continued unless your health care provider directs you  otherwise.  Continue to go to all your prenatal visits as directed by your health care provider. SEEK MEDICAL CARE IF:   You have dizziness.  You have mild pelvic cramps, pelvic pressure, or nagging pain in the abdominal area.  You have persistent nausea, vomiting, or diarrhea.  You have a bad smelling vaginal discharge.  You have pain with urination. SEEK IMMEDIATE MEDICAL CARE IF:   You have a fever.  You are leaking fluid from your vagina.  You have spotting or bleeding from your vagina.  You have severe abdominal cramping or pain.  You have rapid weight gain or loss.  You have shortness of breath with chest pain.  You notice sudden or extreme swelling of your face, hands, ankles, feet, or legs.  You have not felt your baby move in over an hour.  You have severe headaches that do not go away with  medicine.  You have vision changes. Document Released: 08/20/2001 Document Revised: 08/31/2013 Document Reviewed: 10/27/2012 [facility] Patient Information 2015 [place], [place]. This information is not intended to replace advice given to you by your health care provider. Make sure you discuss any questions you have with your health care provider.

## 2015-11-27 NOTE — Progress Notes (Signed)
Work-in Low-risk OB appointment G2P1001 4948w4d Estimated Date of Delivery: 03/14/16 BP 104/54 mmHg  Pulse 80  Wt 167 lb 8 oz (75.978 kg)  LMP 05/12/2015  BP, weight, and urine reviewed.  Refer to obstetrical flow sheet for FH & FHR.  Reports good fm.  Denies lof, vb, or uti s/s. Thinks she lost mucous plug 3d ago, was thick and snotty w/ some brown in it. Lots of cramping. Denies abnormal/malodorous d/c itching/irritation.  Spec exam: cx visually closed, mod amt thin white malodorous d/c. fFN, wet prep collected    Wet prep: many clues, no trich/yeast. Rx metronidazole 500mg  BID x 7d for BV, no sex while taking. SVE: LTC, so fFN not sent Reviewed ptl s/s, fm. To increase po fluids. If really crampy, big glass water, lay on side- if not improving/worsening or other s/s ptl- call us or go to [facility].  Plan:  Continue routine obstetrical care  F/U in 4wks for OB appointment and pn2- cancel Wed appt this week

## 2015-11-29 ENCOUNTER — Encounter: Payer: Medicaid Other | Admitting: Advanced Practice Midwife

## 2015-11-29 LAB — GC/CHLAMYDIA PROBE AMP
Chlamydia trachomatis, NAA: NEGATIVE
Neisseria gonorrhoeae by PCR: NEGATIVE

## 2015-12-27 ENCOUNTER — Ambulatory Visit (INDEPENDENT_AMBULATORY_CARE_PROVIDER_SITE_OTHER): Payer: Medicaid Other | Admitting: Advanced Practice Midwife

## 2015-12-27 ENCOUNTER — Other Ambulatory Visit: Payer: Medicaid Other

## 2015-12-27 VITALS — BP 112/60 | HR 82 | Wt 176.5 lb

## 2015-12-27 DIAGNOSIS — Z131 Encounter for screening for diabetes mellitus: Secondary | ICD-10-CM

## 2015-12-27 DIAGNOSIS — Z3493 Encounter for supervision of normal pregnancy, unspecified, third trimester: Secondary | ICD-10-CM

## 2015-12-27 DIAGNOSIS — Z1389 Encounter for screening for other disorder: Secondary | ICD-10-CM

## 2015-12-27 DIAGNOSIS — Z331 Pregnant state, incidental: Secondary | ICD-10-CM

## 2015-12-27 DIAGNOSIS — Z369 Encounter for antenatal screening, unspecified: Secondary | ICD-10-CM

## 2015-12-27 LAB — POCT URINALYSIS DIPSTICK
GLUCOSE UA: NEGATIVE
KETONES UA: NEGATIVE
Nitrite, UA: NEGATIVE
Protein, UA: NEGATIVE

## 2015-12-27 MED ORDER — ALBUTEROL SULFATE (2.5 MG/3ML) 0.083% IN NEBU: 2.5000 mg | INHALATION_SOLUTION | RESPIRATORY_TRACT | Status: DC | PRN

## 2015-12-27 MED ORDER — ALBUTEROL SULFATE HFA 108 (90 BASE) MCG/ACT IN AERS: 2.0000 | INHALATION_SPRAY | RESPIRATORY_TRACT | Status: DC | PRN

## 2015-12-27 NOTE — Progress Notes (Signed)
G2P1001 5132w6d Estimated Date of Delivery: 03/14/16  Blood pressure 112/60, pulse 82, weight 176 lb 8 oz (80.06 kg), last menstrual period 05/12/2015.   BP weight and urine results all reviewed and noted.  Please refer to the obstetrical flow sheet for the fundal height and fetal heart rate documentation:  Patient reports good fetal movement, denies any bleeding and no rupture of membranes symptoms or regular contractions. Patient is without complaints. Has URI vs allergy sx (runny nose, itchy/sore throat) for a few days.  LCTAB. Antibiotic guidelines discussed.  Also, may need steroids if asthma is not controlled well w/inhaled meds All questions were answered.  Orders Placed This Encounter  Procedures  . POCT urinalysis dipstick    Plan:  Continued routine obstetrical care, Pn2 today.  Inhalers refilled  Return in about 3 weeks (around 01/17/2016) for LROB.

## 2015-12-28 LAB — CBC
Hematocrit: 28.8 % — ABNORMAL LOW (ref 34.0–46.6)
Hemoglobin: 9.4 g/dL — ABNORMAL LOW (ref 11.1–15.9)
MCH: 30 pg (ref 26.6–33.0)
MCHC: 32.6 g/dL (ref 31.5–35.7)
MCV: 92 fL (ref 79–97)
PLATELETS: 233 10*3/uL (ref 150–379)
RBC: 3.13 x10E6/uL — ABNORMAL LOW (ref 3.77–5.28)
RDW: 12.4 % (ref 12.3–15.4)
WBC: 4.6 10*3/uL (ref 3.4–10.8)

## 2015-12-28 LAB — GLUCOSE TOLERANCE, 2 HOURS W/ 1HR
GLUCOSE, 1 HOUR: 68 mg/dL (ref 65–179)
Glucose, 2 hour: 71 mg/dL (ref 65–152)
Glucose, Fasting: 77 mg/dL (ref 65–91)

## 2015-12-28 LAB — HIV ANTIBODY (ROUTINE TESTING W REFLEX): HIV Screen 4th Generation wRfx: NONREACTIVE

## 2015-12-28 LAB — RPR: RPR: NONREACTIVE

## 2015-12-28 LAB — ANTIBODY SCREEN: Antibody Screen: NEGATIVE

## 2016-01-17 ENCOUNTER — Ambulatory Visit (INDEPENDENT_AMBULATORY_CARE_PROVIDER_SITE_OTHER): Payer: Medicaid Other | Admitting: Advanced Practice Midwife

## 2016-01-17 VITALS — BP 112/64 | HR 92 | Wt 179.0 lb

## 2016-01-17 DIAGNOSIS — Z1389 Encounter for screening for other disorder: Secondary | ICD-10-CM

## 2016-01-17 DIAGNOSIS — O360190 Maternal care for anti-D [Rh] antibodies, unspecified trimester, not applicable or unspecified: Secondary | ICD-10-CM | POA: Diagnosis not present

## 2016-01-17 DIAGNOSIS — Z331 Pregnant state, incidental: Secondary | ICD-10-CM

## 2016-01-17 LAB — POCT URINALYSIS DIPSTICK
Blood, UA: NEGATIVE
GLUCOSE UA: NEGATIVE
KETONES UA: NEGATIVE
Nitrite, UA: NEGATIVE

## 2016-01-17 MED ORDER — RHO D IMMUNE GLOBULIN 1500 UNIT/2ML IJ SOSY
300.0000 ug | PREFILLED_SYRINGE | Freq: Once | INTRAMUSCULAR | Status: AC
  Administered 2016-01-17: 300 ug via INTRAMUSCULAR

## 2016-01-17 NOTE — Progress Notes (Signed)
G2P1001 5630w6d Estimated Date of Delivery: 03/14/16  Blood pressure 112/64, pulse 92, weight 179 lb (81.194 kg), last menstrual period 05/12/2015.   BP weight and urine results all reviewed and noted.  Please refer to the obstetrical flow sheet for the fundal height and fetal heart rate documentation:  Patient reports good fetal movement, denies any bleeding and no rupture of membranes symptoms or regular contractions. Patient is without complaints. All questions were answered.  Orders Placed This Encounter  Procedures  . POCT urinalysis dipstick    Plan:  Continued routine obstetrical care,  Return in about 2 weeks (around 01/31/2016) for LROB.

## 2016-01-17 NOTE — Patient Instructions (Signed)

## 2016-01-31 ENCOUNTER — Encounter: Payer: Medicaid Other | Admitting: Women's Health

## 2016-02-01 ENCOUNTER — Encounter: Payer: Medicaid Other | Admitting: Advanced Practice Midwife

## 2016-02-06 ENCOUNTER — Encounter: Payer: Self-pay | Admitting: Women's Health

## 2016-02-06 ENCOUNTER — Ambulatory Visit (INDEPENDENT_AMBULATORY_CARE_PROVIDER_SITE_OTHER): Payer: Medicaid Other | Admitting: Women's Health

## 2016-02-06 VITALS — BP 108/58 | HR 72 | Wt 183.0 lb

## 2016-02-06 DIAGNOSIS — Z3A36 36 weeks gestation of pregnancy: Secondary | ICD-10-CM

## 2016-02-06 DIAGNOSIS — Z331 Pregnant state, incidental: Secondary | ICD-10-CM

## 2016-02-06 DIAGNOSIS — Z3483 Encounter for supervision of other normal pregnancy, third trimester: Secondary | ICD-10-CM

## 2016-02-06 DIAGNOSIS — Z3493 Encounter for supervision of normal pregnancy, unspecified, third trimester: Secondary | ICD-10-CM

## 2016-02-06 DIAGNOSIS — Z1389 Encounter for screening for other disorder: Secondary | ICD-10-CM

## 2016-02-06 LAB — POCT URINALYSIS DIPSTICK
Glucose, UA: NEGATIVE
KETONES UA: NEGATIVE
Nitrite, UA: NEGATIVE
Protein, UA: NEGATIVE
RBC UA: NEGATIVE

## 2016-02-06 NOTE — Progress Notes (Signed)
Low-risk OB appointment G2P1001 656w5d Estimated Date of Delivery: 03/14/16 BP 108/58 mmHg  Pulse 72  Wt 183 lb (83.008 kg)  LMP 05/12/2015  BP, weight, and urine reviewed.  Refer to obstetrical flow sheet for FH & FHR.  Reports good fm.  Denies regular uc's, lof, vb, or uti s/s. No complaints. Reviewed ptl s/s, fkc. Plan:  Continue routine obstetrical care  F/U in 2wks for OB appointment and gbs

## 2016-02-06 NOTE — Patient Instructions (Signed)
Call the office ([phone]) or go to [facility] if:  You begin to have strong, frequent contractions  Your water breaks.  Sometimes it is a big gush of fluid, sometimes it is just a trickle that keeps getting your panties wet or running down your legs  You have vaginal bleeding.  It is normal to have a small amount of spotting if your cervix was checked.   You don't feel your baby moving like normal.  If you don't, get you something to eat and drink and lay down and focus on feeling your baby move.  You should feel at least 10 movements in 2 hours.  If you don't, you should call the office or go to [facility].    Preterm Labor Information Preterm labor is when labor starts at less than 37 weeks of pregnancy. The normal length of a pregnancy is 39 to 41 weeks. CAUSES Often, there is no identifiable underlying cause as to why a woman goes into preterm labor. One of the most common known causes of preterm labor is infection. Infections of the uterus, cervix, vagina, amniotic sac, bladder, kidney, or even the lungs (pneumonia) can cause labor to start. Other suspected causes of preterm labor include:   Urogenital infections, such as yeast infections and bacterial vaginosis.   Uterine abnormalities (uterine shape, uterine septum, fibroids, or bleeding from the placenta).   A cervix that has been operated on (it may fail to stay closed).   Malformations in the fetus.   Multiple gestations (twins, triplets, and so on).   Breakage of the amniotic sac.  RISK FACTORS  Having a previous history of preterm labor.   Having premature rupture of membranes (PROM).   Having a placenta that covers the opening of the cervix (placenta previa).   Having a placenta that separates from the uterus (placental abruption).   Having a cervix that is too weak to hold the fetus in the uterus (incompetent cervix).   Having too much fluid in the amniotic sac (polyhydramnios).   Taking  illegal drugs or smoking while pregnant.   Not gaining enough weight while pregnant.   Being younger than 18 and older than 21 years old.   Having a low socioeconomic status.   Being African American. SYMPTOMS Signs and symptoms of preterm labor include:   Menstrual-like cramps, abdominal pain, or back pain.  Uterine contractions that are regular, as frequent as six in an hour, regardless of their intensity (may be mild or painful).  Contractions that start on the top of the uterus and spread down to the lower abdomen and back.   A sense of increased pelvic pressure.   A watery or bloody mucus discharge that comes from the vagina.  TREATMENT Depending on the length of the pregnancy and other circumstances, your health care provider may suggest bed rest. If necessary, there are medicines that can be given to stop contractions and to mature the fetal lungs. If labor happens before 34 weeks of pregnancy, a prolonged hospital stay may be recommended. Treatment depends on the condition of both you and the fetus.  WHAT SHOULD YOU DO IF YOU THINK YOU ARE IN PRETERM LABOR? Call your health care provider right away. You will need to go to the hospital to get checked immediately. HOW CAN YOU PREVENT PRETERM LABOR IN FUTURE PREGNANCIES? You should:   Stop smoking if you smoke.  Maintain healthy weight gain and avoid chemicals and drugs that are not necessary.  Be watchful for   any type of infection.  Inform your health care provider if you have a known history of preterm labor.   This information is not intended to replace advice given to you by your health care provider. Make sure you discuss any questions you have with your health care provider.   Document Released: 11/16/2003 Document Revised: 04/28/2013 Document Reviewed: 09/28/2012 Elsevier Interactive Patient Education 2016 [employer] Inc[employer]  

## 2016-02-20 ENCOUNTER — Ambulatory Visit (INDEPENDENT_AMBULATORY_CARE_PROVIDER_SITE_OTHER): Payer: Medicaid Other | Admitting: Women's Health

## 2016-02-20 ENCOUNTER — Encounter: Payer: Self-pay | Admitting: Women's Health

## 2016-02-20 VITALS — BP 130/72 | HR 72 | Wt 183.0 lb

## 2016-02-20 DIAGNOSIS — Z331 Pregnant state, incidental: Secondary | ICD-10-CM

## 2016-02-20 DIAGNOSIS — Z3483 Encounter for supervision of other normal pregnancy, third trimester: Secondary | ICD-10-CM

## 2016-02-20 DIAGNOSIS — Z1159 Encounter for screening for other viral diseases: Secondary | ICD-10-CM

## 2016-02-20 DIAGNOSIS — Z118 Encounter for screening for other infectious and parasitic diseases: Secondary | ICD-10-CM

## 2016-02-20 DIAGNOSIS — Z3A37 37 weeks gestation of pregnancy: Secondary | ICD-10-CM

## 2016-02-20 DIAGNOSIS — Z3493 Encounter for supervision of normal pregnancy, unspecified, third trimester: Secondary | ICD-10-CM

## 2016-02-20 DIAGNOSIS — Z3685 Encounter for antenatal screening for Streptococcus B: Secondary | ICD-10-CM

## 2016-02-20 DIAGNOSIS — F320 Major depressive disorder, single episode, mild: Secondary | ICD-10-CM | POA: Insufficient documentation

## 2016-02-20 DIAGNOSIS — O99343 Other mental disorders complicating pregnancy, third trimester: Secondary | ICD-10-CM

## 2016-02-20 DIAGNOSIS — F32A Depression, unspecified: Secondary | ICD-10-CM | POA: Insufficient documentation

## 2016-02-20 DIAGNOSIS — Z1389 Encounter for screening for other disorder: Secondary | ICD-10-CM

## 2016-02-20 LAB — POCT URINALYSIS DIPSTICK
GLUCOSE UA: NEGATIVE
Ketones, UA: NEGATIVE
Leukocytes, UA: NEGATIVE
NITRITE UA: NEGATIVE
RBC UA: NEGATIVE

## 2016-02-20 NOTE — Patient Instructions (Addendum)
Call the office [phone]) or go to [facility] if:  You begin to have strong, frequent contractions  Your water breaks.  Sometimes it is a big gush of fluid, sometimes it is just a trickle that keeps getting your panties wet or running down your legs  You have vaginal bleeding.  It is normal to have a small amount of spotting if your cervix was checked.   You don't feel your baby moving like normal.  If you don't, get you something to eat and drink and lay down and focus on feeling your baby move.  You should feel at least 10 movements in 2 hours.  If you don't, you should call the office or go to [facility] Preterm labor is when labor starts at less than 37 weeks of pregnancy. The normal length of a pregnancy is 39 to 41 weeks. CAUSES Often, there is no identifiable underlying cause as to why a woman goes into preterm labor. One of the most common known causes of preterm labor is infection. Infections of the uterus, cervix, vagina, amniotic sac, bladder, kidney, or even the lungs (pneumonia) can cause labor to start. Other suspected causes of preterm labor include:   Urogenital infections, such as yeast infections and bacterial vaginosis.   Uterine abnormalities (uterine shape, uterine septum, fibroids, or bleeding from the placenta).   A cervix that has been operated on (it may fail to stay closed).   Malformations in the fetus.   Multiple gestations (twins, triplets, and so on).   Breakage of the amniotic sac.  RISK FACTORS  Having a previous history of preterm labor.   Having premature rupture of membranes (PROM).   Having a placenta that covers the opening of the cervix (placenta previa).   Having a placenta that separates from the uterus (placental abruption).   Having a cervix that is too weak to hold the fetus in the uterus (incompetent cervix).   Having too much fluid in the amniotic sac (polyhydramnios).   Taking  illegal drugs or smoking while pregnant.   Not gaining enough weight while pregnant.   Being younger than 35 and older than 21 years old.   Having a low socioeconomic status.   Being African American. SYMPTOMS Signs and symptoms of preterm labor include:   Menstrual-like cramps, abdominal pain, or back pain.  Uterine contractions that are regular, as frequent as six in an hour, regardless of their intensity (may be mild or painful).  Contractions that start on the top of the uterus and spread down to the lower abdomen and back.   A sense of increased pelvic pressure.   A watery or bloody mucus discharge that comes from the vagina.  TREATMENT Depending on the length of the pregnancy and other circumstances, your health care provider may suggest bed rest. If necessary, there are medicines that can be given to stop contractions and to mature the fetal lungs. If labor happens before 34 weeks of pregnancy, a prolonged hospital stay may be recommended. Treatment depends on the condition of both you and the fetus.  WHAT SHOULD YOU DO IF YOU THINK YOU ARE IN PRETERM LABOR? Call your health care provider right away. You will need to go to the hospital to get checked immediately. HOW CAN YOU PREVENT PRETERM LABOR IN FUTURE PREGNANCIES? You should:   Stop smoking if you smoke.  Maintain healthy weight gain and avoid chemicals and drugs that are not necessary.  Be watchful for any  type of infection.  Inform your health care provider if you have a known history of preterm labor.   This information is not intended to replace advice given to you by your health care provider. Make sure you discuss any questions you have with your health care provider.   Document Released: 11/16/2003 Document Revised: 04/28/2013 Document Reviewed: 09/28/2012 Elsevier Interactive Patient Education 2016 Elsevier Inc.  Major Depressive Disorder (Yours is not major at this time) Major depressive  disorder is a mental illness. It also may be called clinical depression or unipolar depression. Major depressive disorder usually causes feelings of sadness, hopelessness, or helplessness. Some people with this disorder do not feel particularly sad but lose interest in doing things they used to enjoy (anhedonia). Major depressive disorder also can cause physical symptoms. It can interfere with work, school, relationships, and other normal everyday activities. The disorder varies in severity but is longer lasting and more serious than the sadness we all feel from time to time in our lives. Major depressive disorder often is triggered by stressful life events or major life changes. Examples of these triggers include divorce, loss of your job or home, a move, and the death of a family member or close friend. Sometimes this disorder occurs for no obvious reason at all. People who have family members with major depressive disorder or bipolar disorder are at higher risk for developing this disorder, with or without life stressors. Major depressive disorder can occur at any age. It may occur just once in your life (single episode major depressive disorder). It may occur multiple times (recurrent major depressive disorder). SYMPTOMS People with major depressive disorder have either anhedonia or depressed mood on nearly a daily basis for at least 2 weeks or longer. Symptoms of depressed mood include:  Feelings of sadness (blue or down in the dumps) or emptiness.  Feelings of hopelessness or helplessness.  Tearfulness or episodes of crying (may be observed by others).  Irritability (children and adolescents). In addition to depressed mood or anhedonia or both, people with this disorder have at least four of the following symptoms:  Difficulty sleeping or sleeping too much.   Significant change (increase or decrease) in appetite or weight.   Lack of energy or motivation.  Feelings of guilt and  worthlessness.   Difficulty concentrating, remembering, or making decisions.  Unusually slow movement (psychomotor retardation) or restlessness (as observed by others).   Recurrent wishes for death, recurrent thoughts of self-harm (suicide), or a suicide attempt. People with major depressive disorder commonly have persistent negative thoughts about themselves, other people, and the world. People with severe major depressive disorder may experiencedistorted beliefs or perceptions about the world (psychotic delusions). They also may see or hear things that are not real (psychotic hallucinations). DIAGNOSIS Major depressive disorder is diagnosed through an assessment by your health care provider. Your health care provider will ask aboutaspects of your daily life, such as mood,sleep, and appetite, to see if you have the diagnostic symptoms of major depressive disorder. Your health care provider may ask about your medical history and use of alcohol or drugs, including prescription medicines. Your health care provider also may do a physical exam and blood work. This is because certain medical conditions and the use of certain substances can cause major depressive disorder-like symptoms (secondary depression). Your health care provider also may refer you to a mental health specialist for further evaluation and treatment. TREATMENT It is important to recognize the symptoms of major depressive disorder and seek treatment.  The following treatments can be prescribed for this disorder:   Medicine. Antidepressant medicines usually are prescribed. Antidepressant medicines are thought to correct chemical imbalances in the brain that are commonly associated with major depressive disorder. Other types of medicine may be added if the symptoms do not respond to antidepressant medicines alone or if psychotic delusions or hallucinations occur.  Talk therapy. Talk therapy can be helpful in treating major depressive  disorder by providing support, education, and guidance. Certain types of talk therapy also can help with negative thinking (cognitive behavioral therapy) and with relationship issues that trigger this disorder (interpersonal therapy). A mental health specialist can help determine which treatment is best for you. Most people with major depressive disorder do well with a combination of medicine and talk therapy. Treatments involving electrical stimulation of the brain can be used in situations with extremely severe symptoms or when medicine and talk therapy do not work over time. These treatments include electroconvulsive therapy, transcranial magnetic stimulation, and vagal nerve stimulation.   This information is not intended to replace advice given to you by your health care provider. Make sure you discuss any questions you have with your health care provider.   Document Released: 12/21/2012 Document Revised: 09/16/2014 Document Reviewed: 12/21/2012 Elsevier Interactive Patient Education [employer]

## 2016-02-20 NOTE — Progress Notes (Signed)
Low-risk OB appointment G2P1001 6845w5d Estimated Date of Delivery: 03/14/16 BP 130/72 mmHg  Pulse 72  Wt 183 lb (83.008 kg)  LMP 05/12/2015  BP, weight, and urine reviewed.  Refer to obstetrical flow sheet for FH & FHR.  Reports good fm.  Denies regular uc's, lof, vb, or uti s/s. Contractions at night and when up walking, some RLP at night when turning. 30mins late for appt this am- states she thought the reminder call said 9:15. Appears down today, low voice, melancholy. States she had some mild depression last pregnancy- never needed meds. Denies SI/HI, eating well, still finds joy in things she used to- not sleeping well d/t pains. Declines counseling, discussed option of starting meds to get into system now before pp period when depression usually worsens, declines today.  GBS, GC/CT collected SVE: 1.5/th/-2, vtx, posterior Reviewed ptl s/s, fkc. Plan:  Continue routine obstetrical care  F/U in 1wk for OB appointment

## 2016-02-21 LAB — GC/CHLAMYDIA PROBE AMP
CHLAMYDIA, DNA PROBE: NEGATIVE
NEISSERIA GONORRHOEAE BY PCR: NEGATIVE

## 2016-02-21 LAB — OB RESULTS CONSOLE GBS: GBS: NEGATIVE

## 2016-02-22 LAB — STREP GP B NAA: STREP GROUP B AG: NEGATIVE

## 2016-02-23 ENCOUNTER — Telehealth (HOSPITAL_COMMUNITY): Payer: Self-pay | Admitting: *Deleted

## 2016-02-26 ENCOUNTER — Encounter: Payer: Self-pay | Admitting: Women's Health

## 2016-02-26 DIAGNOSIS — O26899 Other specified pregnancy related conditions, unspecified trimester: Secondary | ICD-10-CM

## 2016-02-26 DIAGNOSIS — Z6791 Unspecified blood type, Rh negative: Secondary | ICD-10-CM | POA: Insufficient documentation

## 2016-02-27 ENCOUNTER — Encounter: Payer: Medicaid Other | Admitting: Women's Health

## 2016-02-28 ENCOUNTER — Encounter (HOSPITAL_COMMUNITY): Payer: Self-pay | Admitting: Emergency Medicine

## 2016-02-28 ENCOUNTER — Inpatient Hospital Stay (HOSPITAL_COMMUNITY)
Admission: EM | Admit: 2016-02-28 | Discharge: 2016-02-28 | Disposition: A | Discharge status: Other Acute Inpatient Hospital | Payer: Medicaid Other | Attending: Obstetrics and Gynecology | Admitting: Obstetrics and Gynecology

## 2016-02-28 ENCOUNTER — Encounter: Payer: Medicaid Other | Admitting: Women's Health

## 2016-02-28 ENCOUNTER — Telehealth: Payer: Self-pay | Admitting: *Deleted

## 2016-02-28 DIAGNOSIS — Z79899 Other long term (current) drug therapy: Secondary | ICD-10-CM | POA: Insufficient documentation

## 2016-02-28 DIAGNOSIS — Z3493 Encounter for supervision of normal pregnancy, unspecified, third trimester: Secondary | ICD-10-CM

## 2016-02-28 DIAGNOSIS — Z3A37 37 weeks gestation of pregnancy: Secondary | ICD-10-CM | POA: Diagnosis not present

## 2016-02-28 DIAGNOSIS — Z3483 Encounter for supervision of other normal pregnancy, third trimester: Secondary | ICD-10-CM | POA: Insufficient documentation

## 2016-02-28 DIAGNOSIS — J45909 Unspecified asthma, uncomplicated: Secondary | ICD-10-CM | POA: Insufficient documentation

## 2016-02-28 NOTE — MAU Note (Signed)
Notified provider that patient is unchanged. Provider said patient could be discharged.

## 2016-02-28 NOTE — Telephone Encounter (Signed)
Pt c/o diahrrea, pains and contractions "spaced out" , no gush fluids, +FM, cramping, no vaginal bleeding. Pt has an appointment this am but no showed, offered pt an appt for 2 pm today. Pt also informed if gush of fluids, contractions 5-10 minutes apart, [facility] Pt verbalized understanding.

## 2016-02-28 NOTE — ED Notes (Signed)
Pt states she is [redacted] weeks pregnant started having contractions around 1100pm last night worse and more frequent now. Pt attempted to [facility] but ended up here on [facility]. OB rapid response called and in route.

## 2016-02-28 NOTE — MAU Note (Signed)
Notified provider that patient's cervical exam is unchanged after her hour of ambulating. Provider said patient could ambulate for another hour and be rechecked.

## 2016-02-28 NOTE — MAU Note (Signed)
Patient presents with c/o ctxs 2-4 mins apart. Patient denies any bleeding or LOF.

## 2016-02-28 NOTE — Discharge Instructions (Signed)
Braxton Hicks Contractions °Contractions of the uterus can occur throughout pregnancy. Contractions are not always a sign that you are in labor.  °WHAT ARE BRAXTON HICKS CONTRACTIONS?  °Contractions that occur before labor are called Braxton Hicks contractions, or false labor. Toward the end of pregnancy (32-34 weeks), these contractions can develop more often and may become more forceful. This is not true labor because these contractions do not result in opening (dilatation) and thinning of the cervix. They are sometimes difficult to tell apart from true labor because these contractions can be forceful and people have different pain tolerances. You should not feel embarrassed if you go to the hospital with false labor. Sometimes, the only way to tell if you are in true labor is for your health care provider to look for changes in the cervix. °If there are no prenatal problems or other health problems associated with the pregnancy, it is completely safe to be sent home with false labor and await the onset of true labor. °HOW CAN YOU TELL THE DIFFERENCE BETWEEN TRUE AND FALSE LABOR? °False Labor °· The contractions of false labor are usually shorter and not as hard as those of true labor.   °· The contractions are usually irregular.   °· The contractions are often felt in the front of the lower abdomen and in the groin.   °· The contractions may go away when you walk around or change positions while lying down.   °· The contractions get weaker and are shorter lasting as time goes on.   °· The contractions do not usually become progressively stronger, regular, and closer together as with true labor.   °True Labor °· Contractions in true labor last 30-70 seconds, become very regular, usually become more intense, and increase in frequency.   °· The contractions do not go away with walking.   °· The discomfort is usually felt in the top of the uterus and spreads to the lower abdomen and low back.   °· True labor can be  determined by your health care provider with an exam. This will show that the cervix is dilating and getting thinner.   °WHAT TO REMEMBER °· Keep up with your usual exercises and follow other instructions given by your health care provider.   °· Take medicines as directed by your health care provider.   °· Keep your regular prenatal appointments.   °· Eat and drink lightly if you think you are going into labor.   °· If Braxton Hicks contractions are making you uncomfortable:   °¨ Change your position from lying down or resting to walking, or from walking to resting.   °¨ Sit and rest in a tub of warm water.   °¨ Drink 2-3 glasses of water. Dehydration may cause these contractions.   °¨ Do slow and deep breathing several times an hour.   °WHEN SHOULD I SEEK IMMEDIATE MEDICAL CARE? °Seek immediate medical care if: °· Your contractions become stronger, more regular, and closer together.   °· You have fluid leaking or gushing from your vagina.   °· You have a fever.   °· You pass blood-tinged mucus.   °· You have vaginal bleeding.   °· You have continuous abdominal pain.   °· You have low back pain that you never had before.   °· You feel your baby's head pushing down and causing pelvic pressure.   °· Your baby is not moving as much as it used to.   °  °This information is not intended to replace advice given to you by your health care provider. Make sure you discuss any questions you have with your health care   provider. °  °Document Released: 08/26/2005 Document Revised: 08/31/2013 Document Reviewed: 06/07/2013 °Elsevier Interactive Patient Education ©2016 [employer] °Fetal Movement Counts °Patient Name: __________________________________________________ Patient Due Date: ____________________ °Performing a fetal movement count is highly recommended in high-risk pregnancies, but it is good for every pregnant woman to do. Your health care provider may ask you to start counting fetal movements at 28 weeks of the  pregnancy. Fetal movements often increase: °· After eating a full meal. °· After physical activity. °· After eating or drinking something sweet or cold. °· At rest. °Pay attention to when you feel the baby is most active. This will help you notice a pattern of your baby's sleep and wake cycles and what factors contribute to an increase in fetal movement. It is important to perform a fetal movement count at the same time each day when your baby is normally most active.  °HOW TO COUNT FETAL MOVEMENTS °· Find a quiet and comfortable area to sit or lie down on your left side. Lying on your left side provides the best blood and oxygen circulation to your baby. °· Write down the day and time on a sheet of paper or in a journal. °· Start counting kicks, flutters, swishes, rolls, or jabs in a 2-hour period. You should feel at least 10 movements within 2 hours. °· If you do not feel 10 movements in 2 hours, wait 2-3 hours and count again. Look for a change in the pattern or not enough counts in 2 hours. °SEEK MEDICAL CARE IF: °· You feel less than 10 counts in 2 hours, tried twice. °· There is no movement in over an hour. °· The pattern is changing or taking longer each day to reach 10 counts in 2 hours. °· You feel the baby is not moving as he or she usually does. °Date: ____________ Movements: ____________ Start time: ____________ Finish time: ____________  °Date: ____________ Movements: ____________ Start time: ____________ Finish time: ____________ °Date: ____________ Movements: ____________ Start time: ____________ Finish time: ____________ °Date: ____________ Movements: ____________ Start time: ____________ Finish time: ____________ °Date: ____________ Movements: ____________ Start time: ____________ Finish time: ____________ °Date: ____________ Movements: ____________ Start time: ____________ Finish time: ____________ °Date: ____________ Movements: ____________ Start time: ____________ Finish time: ____________ °Date:  ____________ Movements: ____________ Start time: ____________ Finish time: ____________  °Date: ____________ Movements: ____________ Start time: ____________ Finish time: ____________ °Date: ____________ Movements: ____________ Start time: ____________ Finish time: ____________ °Date: ____________ Movements: ____________ Start time: ____________ Finish time: ____________ °Date: ____________ Movements: ____________ Start time: ____________ Finish time: ____________ °Date: ____________ Movements: ____________ Start time: ____________ Finish time: ____________ °Date: ____________ Movements: ____________ Start time: ____________ Finish time: ____________ °Date: ____________ Movements: ____________ Start time: ____________ Finish time: ____________  °Date: ____________ Movements: ____________ Start time: ____________ Finish time: ____________ °Date: ____________ Movements: ____________ Start time: ____________ Finish time: ____________ °Date: ____________ Movements: ____________ Start time: ____________ Finish time: ____________ °Date: ____________ Movements: ____________ Start time: ____________ Finish time: ____________ °Date: ____________ Movements: ____________ Start time: ____________ Finish time: ____________ °Date: ____________ Movements: ____________ Start time: ____________ Finish time: ____________ °Date: ____________ Movements: ____________ Start time: ____________ Finish time: ____________  °Date: ____________ Movements: ____________ Start time: ____________ Finish time: ____________ °Date: ____________ Movements: ____________ Start time: ____________ Finish time: ____________ °Date: ____________ Movements: ____________ Start time: ____________ Finish time: ____________ °Date: ____________ Movements: ____________ Start time: ____________ Finish time: ____________ °Date: ____________ Movements: ____________ Start time: ____________ Finish time: ____________ °Date: ____________ Movements: ____________ Start  time: ____________ Finish time:   ____________ °Date: ____________ Movements: ____________ Start time: ____________ Finish time: ____________  °Date: ____________ Movements: ____________ Start time: ____________ Finish time: ____________ °Date: ____________ Movements: ____________ Start time: ____________ Finish time: ____________ °Date: ____________ Movements: ____________ Start time: ____________ Finish time: ____________ °Date: ____________ Movements: ____________ Start time: ____________ Finish time: ____________ °Date: ____________ Movements: ____________ Start time: ____________ Finish time: ____________ °Date: ____________ Movements: ____________ Start time: ____________ Finish time: ____________ °Date: ____________ Movements: ____________ Start time: ____________ Finish time: ____________  °Date: ____________ Movements: ____________ Start time: ____________ Finish time: ____________ °Date: ____________ Movements: ____________ Start time: ____________ Finish time: ____________ °Date: ____________ Movements: ____________ Start time: ____________ Finish time: ____________ °Date: ____________ Movements: ____________ Start time: ____________ Finish time: ____________ °Date: ____________ Movements: ____________ Start time: ____________ Finish time: ____________ °Date: ____________ Movements: ____________ Start time: ____________ Finish time: ____________ °Date: ____________ Movements: ____________ Start time: ____________ Finish time: ____________  °Date: ____________ Movements: ____________ Start time: ____________ Finish time: ____________ °Date: ____________ Movements: ____________ Start time: ____________ Finish time: ____________ °Date: ____________ Movements: ____________ Start time: ____________ Finish time: ____________ °Date: ____________ Movements: ____________ Start time: ____________ Finish time: ____________ °Date: ____________ Movements: ____________ Start time: ____________ Finish time:  ____________ °Date: ____________ Movements: ____________ Start time: ____________ Finish time: ____________ °Date: ____________ Movements: ____________ Start time: ____________ Finish time: ____________  °Date: ____________ Movements: ____________ Start time: ____________ Finish time: ____________ °Date: ____________ Movements: ____________ Start time: ____________ Finish time: ____________ °Date: ____________ Movements: ____________ Start time: ____________ Finish time: ____________ °Date: ____________ Movements: ____________ Start time: ____________ Finish time: ____________ °Date: ____________ Movements: ____________ Start time: ____________ Finish time: ____________ °Date: ____________ Movements: ____________ Start time: ____________ Finish time: ____________ °  °This information is not intended to replace advice given to you by your health care provider. Make sure you discuss any questions you have with your health care provider. °  °Document Released: 09/25/2006 Document Revised: 09/16/2014 Document Reviewed: 06/22/2012 °Elsevier Interactive Patient Education ©2016 [employer] Inc[employer] ° °

## 2016-02-28 NOTE — ED Provider Notes (Signed)
CSN: [id number]     Arrival date & time 02/28/16  1412 History   None    Chief Complaint  Patient presents with  . Contractions   HPI Pt states she is [redacted] weeks pregnant started having contractions around 1100pm last night worse and more frequent now. [facility] but ended up here on GPS. OB rapid response called and in route Upon patient arrival. Patient states that she's had one child prior to this and had an uncomplicated vaginal delivery that did not require any epidural or pain medication and that she is concerned that this will progress quickly and with minimal pain. Her last surgical check was approximately 1-1/2 cm last week. She states she's had an uncomplicated pregnancy thus far. She does not believe her water hasn't broken. Her pain is minimal. Her contractions are becoming more frequent but she is unable to tell me the timing exactly. She states her pain is mild. She has not taken anything for it.  Past Medical History  Diagnosis Date  . Asthma   . Respiratory failure, acute (HCC) 06/2012    bipap only, not intubated, due to asthma exacerbation  . Chronic abdominal pain   . Chronic knee pain    Past Surgical History  Procedure Laterality Date  . None     Family History  Problem Relation Age of Onset  . Asthma Sister   . Diabetes Maternal Grandmother   . Hypertension Maternal Grandmother   . Diabetes Maternal Grandfather   . Asthma Maternal Grandfather   . Colon cancer Neg Hx   . Breast cancer Maternal Aunt    Social History  Substance Use Topics  . Smoking status: Never Smoker   . Smokeless tobacco: Never Used  . Alcohol Use: No   OB History    Gravida Para Term Preterm AB TAB SAB Ectopic Multiple Living   2 1 1  0 0 0 0 0 0 1     Review of Systems  Constitutional: Negative for fever.  Allergic/Immunologic: Negative for immunocompromised state.  All other systems reviewed and are negative.     Allergies  Shrimp  Home Medications    Prior to Admission medications   Medication Sig Start Date End Date Taking? Authorizing Provider  albuterol (PROVENTIL HFA;VENTOLIN HFA) 108 (90 Base) MCG/ACT inhaler Inhale 2 puffs into the lungs every 4 (four) hours as needed for wheezing or shortness of breath. Patient not taking: Reported on 02/20/2016 12/27/15   Jacklyn ShellFrances Cresenzo-Dishmon, CNM  albuterol (PROVENTIL) (2.5 MG/3ML) 0.083% nebulizer solution Take 3 mLs (2.5 mg total) by nebulization every 4 (four) hours as needed for wheezing or shortness of breath. Patient not taking: Reported on 02/20/2016 12/27/15   Jacklyn ShellFrances Cresenzo-Dishmon, CNM  Prenatal Vit-Fe Fumarate-FA (PRENATAL MULTIVITAMIN) TABS tablet Take 1 tablet by mouth daily at 12 noon. 08/30/15   Standley Brookinganiel P Goodrich, MD   BP 132/92 mmHg  Pulse 90  Temp(Src) 98 F (36.7 C) (Oral)  Resp 16  Ht 5\' 6"  (1.676 m)  Wt 83.008 kg  BMI 29.55 kg/m2  SpO2 100%  LMP 05/12/2015 Physical Exam  Constitutional: She is oriented to person, place, and time. She appears well-developed and well-nourished. No distress.  HENT:  Head: Normocephalic and atraumatic.  Eyes: Conjunctivae are normal. Right eye exhibits no discharge. Left eye exhibits no discharge.  Neck: Normal range of motion. Neck supple.  Cardiovascular: Normal rate and regular rhythm.   Pulmonary/Chest: Effort normal and breath sounds normal. No respiratory distress.  Abdominal: Soft.  Obvious gravid abdomen  Musculoskeletal: She exhibits no edema.  Neurological: She is alert and oriented to person, place, and time.  Skin: Skin is warm. No rash noted.  Psychiatric: She has a normal mood and affect.  Nursing note and vitals reviewed.   ED Course  Procedures (including critical care time) Labs Review Labs Reviewed - No data to display  Imaging Review No results found. I have personally reviewed and evaluated these images and lab results as part of my medical decision-making.   EKG Interpretation None      MDM    Final diagnoses:  Supervision of normal pregnancy, third trimester   Exam week pregnancy G2 P1 presents with contractions. Evaluated by OB at the bedside and felt to be stable for transfer. Not in active labor but approaching. Hemodynamically stable. No prior complications and no consultations currently. OB [facility]. Patient advised and understands and will be transferred.   Sidney Ace, MD 02/28/16 1506  Azalia Bilis, MD 02/28/16 [phone]

## 2016-02-28 NOTE — Progress Notes (Signed)
Called Dr Jolayne Pantherconstant in regards to being called to [facility] for a G2P1 37.6 weeks, [facility]. Came in today complaining of contractions. Informed of sve, uc pattern, fhr tracing, to care link to [facility] for further evaluation. Ed dr informed of POC.

## 2016-03-01 ENCOUNTER — Encounter: Payer: Medicaid Other | Admitting: Obstetrics & Gynecology

## 2016-03-02 ENCOUNTER — Encounter (HOSPITAL_COMMUNITY): Payer: Self-pay | Admitting: *Deleted

## 2016-03-02 ENCOUNTER — Inpatient Hospital Stay (HOSPITAL_COMMUNITY)
Admission: AD | Admit: 2016-03-02 | Discharge: 2016-03-03 | DRG: 775 | Disposition: A | Discharge status: Home / Self Care | Payer: Medicaid Other | Source: Ambulatory Visit | Attending: Family Medicine | Admitting: Family Medicine

## 2016-03-02 DIAGNOSIS — Z3A38 38 weeks gestation of pregnancy: Secondary | ICD-10-CM

## 2016-03-02 DIAGNOSIS — Z8249 Family history of ischemic heart disease and other diseases of the circulatory system: Secondary | ICD-10-CM | POA: Diagnosis not present

## 2016-03-02 DIAGNOSIS — O99824 Streptococcus B carrier state complicating childbirth: Secondary | ICD-10-CM

## 2016-03-02 DIAGNOSIS — Z6791 Unspecified blood type, Rh negative: Secondary | ICD-10-CM | POA: Diagnosis not present

## 2016-03-02 DIAGNOSIS — O26893 Other specified pregnancy related conditions, third trimester: Secondary | ICD-10-CM | POA: Diagnosis present

## 2016-03-02 DIAGNOSIS — J45909 Unspecified asthma, uncomplicated: Secondary | ICD-10-CM | POA: Diagnosis present

## 2016-03-02 DIAGNOSIS — Z833 Family history of diabetes mellitus: Secondary | ICD-10-CM

## 2016-03-02 DIAGNOSIS — Z825 Family history of asthma and other chronic lower respiratory diseases: Secondary | ICD-10-CM

## 2016-03-02 DIAGNOSIS — F329 Major depressive disorder, single episode, unspecified: Secondary | ICD-10-CM | POA: Diagnosis present

## 2016-03-02 DIAGNOSIS — O9952 Diseases of the respiratory system complicating childbirth: Secondary | ICD-10-CM | POA: Diagnosis present

## 2016-03-02 DIAGNOSIS — O2243 Hemorrhoids in pregnancy, third trimester: Secondary | ICD-10-CM | POA: Diagnosis present

## 2016-03-02 DIAGNOSIS — O99344 Other mental disorders complicating childbirth: Secondary | ICD-10-CM | POA: Diagnosis present

## 2016-03-02 DIAGNOSIS — Z3A40 40 weeks gestation of pregnancy: Secondary | ICD-10-CM

## 2016-03-02 DIAGNOSIS — D649 Anemia, unspecified: Secondary | ICD-10-CM | POA: Diagnosis not present

## 2016-03-02 DIAGNOSIS — Z3493 Encounter for supervision of normal pregnancy, unspecified, third trimester: Secondary | ICD-10-CM

## 2016-03-02 DIAGNOSIS — O9902 Anemia complicating childbirth: Secondary | ICD-10-CM

## 2016-03-02 DIAGNOSIS — IMO0001 Reserved for inherently not codable concepts without codable children: Secondary | ICD-10-CM

## 2016-03-02 LAB — TYPE AND SCREEN
ABO/RH(D): O NEG
Antibody Screen: POSITIVE
DAT, IgG: NEGATIVE

## 2016-03-02 LAB — CBC
HCT: 25.2 % — ABNORMAL LOW (ref 36.0–46.0)
HEMOGLOBIN: 8.4 g/dL — AB (ref 12.0–15.0)
MCH: 29.8 pg (ref 26.0–34.0)
MCHC: 33.3 g/dL (ref 30.0–36.0)
MCV: 89.4 fL (ref 78.0–100.0)
PLATELETS: 208 10*3/uL (ref 150–400)
RBC: 2.82 MIL/uL — ABNORMAL LOW (ref 3.87–5.11)
RDW: 13.4 % (ref 11.5–15.5)
WBC: 6.4 10*3/uL (ref 4.0–10.5)

## 2016-03-02 LAB — RPR: RPR Ser Ql: NONREACTIVE

## 2016-03-02 MED ORDER — ACETAMINOPHEN 325 MG PO TABS: 650.0000 mg | ORAL_TABLET | ORAL | Status: DC | PRN

## 2016-03-02 MED ORDER — LACTATED RINGERS IV SOLN: 500.0000 mL | INTRAVENOUS | Status: DC | PRN

## 2016-03-02 MED ORDER — LACTATED RINGERS IV SOLN: INTRAVENOUS | Status: DC

## 2016-03-02 MED ORDER — COCONUT OIL OIL: 1.0000 "application " | TOPICAL_OIL | Status: DC | PRN

## 2016-03-02 MED ORDER — OXYTOCIN BOLUS FROM INFUSION
500.0000 mL | INTRAVENOUS | Status: DC
  Administered 2016-03-02: 500 mL via INTRAVENOUS

## 2016-03-02 MED ORDER — OXYCODONE-ACETAMINOPHEN 5-325 MG PO TABS: 1.0000 | ORAL_TABLET | ORAL | Status: DC | PRN

## 2016-03-02 MED ORDER — FLEET ENEMA 7-19 GM/118ML RE ENEM: 1.0000 | ENEMA | Freq: Every day | RECTAL | Status: DC | PRN

## 2016-03-02 MED ORDER — OXYCODONE-ACETAMINOPHEN 5-325 MG PO TABS: 2.0000 | ORAL_TABLET | ORAL | Status: DC | PRN

## 2016-03-02 MED ORDER — WITCH HAZEL-GLYCERIN EX PADS: 1.0000 "application " | MEDICATED_PAD | CUTANEOUS | Status: DC | PRN

## 2016-03-02 MED ORDER — MEASLES, MUMPS & RUBELLA VAC ~~LOC~~ INJ
0.5000 mL | INJECTION | Freq: Once | SUBCUTANEOUS | Status: DC
  Filled 2016-03-02: qty 0.5

## 2016-03-02 MED ORDER — ONDANSETRON HCL 4 MG/2ML IJ SOLN: 4.0000 mg | Freq: Four times a day (QID) | INTRAMUSCULAR | Status: DC | PRN

## 2016-03-02 MED ORDER — SODIUM CHLORIDE 0.9% FLUSH: 3.0000 mL | Freq: Two times a day (BID) | INTRAVENOUS | Status: DC

## 2016-03-02 MED ORDER — BISACODYL 10 MG RE SUPP: 10.0000 mg | Freq: Every day | RECTAL | Status: DC | PRN

## 2016-03-02 MED ORDER — SODIUM CHLORIDE 0.9 % IV SOLN: 250.0000 mL | INTRAVENOUS | Status: DC | PRN

## 2016-03-02 MED ORDER — LIDOCAINE HCL (PF) 1 % IJ SOLN
30.0000 mL | INTRAMUSCULAR | Status: DC | PRN
  Filled 2016-03-02: qty 30

## 2016-03-02 MED ORDER — OXYCODONE-ACETAMINOPHEN 5-325 MG PO TABS
1.0000 | ORAL_TABLET | ORAL | Status: DC | PRN
  Administered 2016-03-02: 1 via ORAL
  Filled 2016-03-02: qty 1

## 2016-03-02 MED ORDER — ALBUTEROL SULFATE (2.5 MG/3ML) 0.083% IN NEBU: 3.0000 mL | INHALATION_SOLUTION | RESPIRATORY_TRACT | Status: DC | PRN

## 2016-03-02 MED ORDER — DIBUCAINE 1 % RE OINT
1.0000 "application " | TOPICAL_OINTMENT | RECTAL | Status: DC | PRN
  Administered 2016-03-02: 1 via RECTAL
  Filled 2016-03-02: qty 28

## 2016-03-02 MED ORDER — SENNOSIDES-DOCUSATE SODIUM 8.6-50 MG PO TABS
2.0000 | ORAL_TABLET | ORAL | Status: DC
Start: 2016-03-03 — End: 2016-03-03
  Administered 2016-03-03: 2 via ORAL
  Filled 2016-03-02: qty 2

## 2016-03-02 MED ORDER — BENZOCAINE-MENTHOL 20-0.5 % EX AERO
1.0000 "application " | INHALATION_SPRAY | CUTANEOUS | Status: DC | PRN
  Administered 2016-03-02: 1 via TOPICAL
  Filled 2016-03-02: qty 56

## 2016-03-02 MED ORDER — FLEET ENEMA 7-19 GM/118ML RE ENEM: 1.0000 | ENEMA | RECTAL | Status: DC | PRN

## 2016-03-02 MED ORDER — FENTANYL CITRATE (PF) 100 MCG/2ML IJ SOLN
100.0000 ug | INTRAMUSCULAR | Status: DC | PRN
  Administered 2016-03-02 (×2): 100 ug via INTRAVENOUS
  Filled 2016-03-02 (×2): qty 2

## 2016-03-02 MED ORDER — SOD CITRATE-CITRIC ACID 500-334 MG/5ML PO SOLN: 30.0000 mL | ORAL | Status: DC | PRN

## 2016-03-02 MED ORDER — DIPHENHYDRAMINE HCL 25 MG PO CAPS: 25.0000 mg | ORAL_CAPSULE | Freq: Four times a day (QID) | ORAL | Status: DC | PRN

## 2016-03-02 MED ORDER — OXYTOCIN 40 UNITS IN LACTATED RINGERS INFUSION - SIMPLE MED
2.5000 [IU]/h | INTRAVENOUS | Status: DC
  Administered 2016-03-02: 2.5 [IU]/h via INTRAVENOUS
  Filled 2016-03-02: qty 1000

## 2016-03-02 MED ORDER — IBUPROFEN 600 MG PO TABS
600.0000 mg | ORAL_TABLET | Freq: Four times a day (QID) | ORAL | Status: DC
  Administered 2016-03-02 – 2016-03-03 (×6): 600 mg via ORAL
  Filled 2016-03-02 (×6): qty 1

## 2016-03-02 MED ORDER — SIMETHICONE 80 MG PO CHEW: 80.0000 mg | CHEWABLE_TABLET | ORAL | Status: DC | PRN

## 2016-03-02 MED ORDER — ZOLPIDEM TARTRATE 5 MG PO TABS: 5.0000 mg | ORAL_TABLET | Freq: Every evening | ORAL | Status: DC | PRN

## 2016-03-02 MED ORDER — ONDANSETRON HCL 4 MG PO TABS: 4.0000 mg | ORAL_TABLET | ORAL | Status: DC | PRN

## 2016-03-02 MED ORDER — ONDANSETRON HCL 4 MG/2ML IJ SOLN
4.0000 mg | INTRAMUSCULAR | Status: DC | PRN
Start: 2016-03-02 — End: 2016-03-03

## 2016-03-02 MED ORDER — TETANUS-DIPHTH-ACELL PERTUSSIS 5-2.5-18.5 LF-MCG/0.5 IM SUSP: 0.5000 mL | Freq: Once | INTRAMUSCULAR | Status: DC

## 2016-03-02 MED ORDER — SODIUM CHLORIDE 0.9% FLUSH
3.0000 mL | INTRAVENOUS | Status: DC | PRN
  Administered 2016-03-02: 3 mL via INTRAVENOUS
  Filled 2016-03-02: qty 3

## 2016-03-02 MED ORDER — PRENATAL MULTIVITAMIN CH
1.0000 | ORAL_TABLET | Freq: Every day | ORAL | Status: DC
  Administered 2016-03-02 – 2016-03-03 (×2): 1 via ORAL
  Filled 2016-03-02 (×2): qty 1

## 2016-03-02 NOTE — Progress Notes (Signed)
LATE ENTRY FOR 0506 - LIMITED QUESTIONS ASKED DUE TO PT HAVING PAINFUL CTXS. 2ND BABY, 6 CM., NOTIFIED DR Cephus RicherAGUILAR AND CHG NURSE NATALIE RN - PT TO RM 174 VIA W/C

## 2016-03-02 NOTE — Progress Notes (Signed)
LABOR PROGRESS NOTE  Sharon Nash D Staff is a 20 y.o. G2P1001 at 2072w2d  admitted for SOL  Subjective: Pt received fentanyl x 1, complaining of pressure but comfortable in between contractions.  Objective: BP 119/69 mmHg  Pulse 84  Temp(Src) 98.3 F (36.8 C) (Oral)  Resp 18  Ht 5\' 7"  (1.702 m)  Wt 180 lb (81.647 kg)  BMI 28.19 kg/m2  LMP 05/12/2015 or  Filed Vitals:   03/02/16 0457 03/02/16 0518  BP: 132/75 119/69  Pulse: 82 84  Temp: 98.3 F (36.8 C) 98.3 F (36.8 C)  TempSrc: Oral Oral  Resp: 20 18  Height:  5\' 7"  (1.702 m)  Weight:  180 lb (81.647 kg)     Dilation: 7.5 Effacement (%): 80 Cervical Position: Posterior Station: -1 Presentation: Vertex Exam by:: Dr Richardo HanksAquilar  Labs: Lab Results  Component Value Date   WBC 6.4 03/02/2016   HGB 8.4* 03/02/2016   HCT 25.2* 03/02/2016   MCV 89.4 03/02/2016   PLT 208 03/02/2016    Patient Active Problem List   Diagnosis Date Noted  . Active labor at term 03/02/2016  . Rh negative state in antepartum period 02/26/2016  . 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 Supervision of normal pregnancy 09/13/2015  . Hyperemesis arising during pregnancy 08/30/2015  . Upper gastrointestinal bleeding 08/28/2015  . Status asthmaticus 07/06/2012  . Acute respiratory failure (HCC) 07/06/2012  . Asthma exacerbation 07/05/2012    Assessment / Plan: 20 y.o. G2P1001 at 4672w2d here for SOL  Labor: Active, progressing. AROM 0600, clear Fetal Wellbeing:  Cat-1 Pain Control:  Fentanyl, NO Anticipated MOD:  NSVD  Olena LeatherwoodKelly M Noble Cicalese, MD 03/02/2016, 6:18 AM

## 2016-03-02 NOTE — MAU Note (Signed)
Contractions since 1am.  No leaking.  No bleeding. Baby moving well.

## 2016-03-02 NOTE — Lactation Note (Signed)
This note was copied from a baby's chart. Lactation Consultation Note  Patient Name: Sharon Hassan Rowaniquria Scouten ZOXWR'UToday's Date: 03/02/2016 Reason for consult: Initial assessment Baby 7 hours old. Mom reports that she has attempted to latch baby, but that baby not wanting to latch. Offered to assist with latch but mom declined d/t baby fed with formula and sleeping and mom wanting to rest. Mom states that she had difficulty latching her son, but then on the 3rd day of life, he finally latched. Mom states that will probably happen with this baby. Discussed supply and demand and the benefits of having baby at breast attempting to latch. Enc mom to ask for assistance with latching since each baby is different. [facility], aware of OP/BFSG and LC phone line assistance after D/C.   Maternal Data Does the patient have breastfeeding experience prior to this delivery?: Yes  Feeding Feeding Type: Bottle Fed - Formula Nipple Type: Slow - flow  LATCH Score/Interventions                      Lactation Tools Discussed/Used     Consult Status Consult Status: Follow-up Date: 03/03/16 Follow-up type: In-patient    Geralynn OchsWILLIARD, Gerard Cantara 03/02/2016, 4:50 PM

## 2016-03-02 NOTE — Progress Notes (Signed)
Placenta not delivered

## 2016-03-02 NOTE — H&P (Signed)
LABOR AND DELIVERY ADMISSION HISTORY AND PHYSICAL NOTE  Sharon Nash is a 20 y.o. female G2P1001 with IUP at 5230w2d by LMP presenting for SOL.  Prenatal risk factors: RH negative She reports positive fetal movement. She denies leakage of fluid or vaginal bleeding.  Prenatal History/Complications:  Past Medical History: Past Medical History  Diagnosis Date  . Asthma   . Respiratory failure, acute (HCC) 06/2012    bipap only, not intubated, due to asthma exacerbation  . Chronic abdominal pain   . Chronic knee pain     Past Surgical History: Past Surgical History  Procedure Laterality Date  . None      Obstetrical History: OB History    Gravida Para Term Preterm AB TAB SAB Ectopic Multiple Living   2 1 1  0 0 0 0 0 0 1      Social History: Social History   Social History  . Marital Status: Single    Spouse Name: N/A  . Number of Children: N/A  . Years of Education: N/A   Social History Main Topics  . Smoking status: Never Smoker   . Smokeless tobacco: Never Used  . Alcohol Use: No  . Drug Use: No  . Sexual Activity: Yes   Other Topics Concern  . Not on file   Social History Narrative    Family History: Family History  Problem Relation Age of Onset  . Asthma Sister   . Diabetes Maternal Grandmother   . Hypertension Maternal Grandmother   . Diabetes Maternal Grandfather   . Asthma Maternal Grandfather   . Colon cancer Neg Hx   . Breast cancer Maternal Aunt     Allergies: Allergies  Allergen Reactions  . Shrimp [Shellfish Allergy] Anaphylaxis    Prescriptions prior to admission  Medication Sig Dispense Refill Last Dose  . albuterol (PROVENTIL HFA;VENTOLIN HFA) 108 (90 Base) MCG/ACT inhaler Inhale 2 puffs into the lungs every 4 (four) hours as needed for wheezing or shortness of breath. (Patient taking differently: Inhale 2 puffs into the lungs every 4 (four) hours as needed for wheezing or shortness of breath. For shortness or breath/asthma) 1  Inhaler prn 02/28/2016 at Unknown time  . albuterol (PROVENTIL) (2.5 MG/3ML) 0.083% nebulizer solution Take 3 mLs (2.5 mg total) by nebulization every 4 (four) hours as needed for wheezing or shortness of breath. (Patient taking differently: Take 2.5 mg by nebulization every 4 (four) hours as needed for wheezing or shortness of breath. For rescue, asthma attacks) 75 mL prn Past Month at Unknown time  . Prenatal Vit-Fe Fumarate-FA (PRENATAL MULTIVITAMIN) TABS tablet Take 1 tablet by mouth daily at 12 noon. 30 tablet 0 02/28/2016 at Unknown time     Review of Systems   All systems reviewed and negative except as stated in HPI  Blood pressure 132/75, pulse 82, temperature 98.3 F (36.8 C), temperature source Oral, resp. rate 20, last menstrual period 05/12/2015. General appearance: alert and cooperative Lungs: clear to auscultation bilaterally Heart: regular rate and rhythm Abdomen: soft, non-tender; bowel sounds normal Extremities: No calf swelling or tenderness Presentation: cephalic Fetal monitoring: Baseline 125, mod variability,+acels, no decels Uterine activity: q2-43min Dilation: 6 Effacement (%): 90 Station: -2 Exam by:: Sharon Nash   Prenatal labs: ABO, Rh: --/--/O NEG (12/20 0025) Antibody: Negative (04/19 0837) Rubella: Immune RPR: Non Reactive (04/19 0837)  HBsAg: Negative (01/04 1509)  HIV: Non Reactive (04/19 0837)  GBS: Negative (06/14 0000)  2 hr Glucola: 16,10,9677,68,71  Genetic screening: Normal Anatomy US:  normal  Prenatal Transfer Tool  Maternal Diabetes: No Genetic Screening: Normal Maternal Ultrasounds/Referrals: Normal Fetal Ultrasounds or other Referrals:  None Maternal Substance Abuse:  No Significant Maternal Medications:  None Significant Maternal Lab Results: None  No results found for this or any previous visit (from the past 24 hour(s)).  Patient Active Problem List   Diagnosis Date Noted  . Rh negative state in antepartum period 02/26/2016  .  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 Supervision of normal pregnancy 09/13/2015  . Hyperemesis arising during pregnancy 08/30/2015  . Upper gastrointestinal bleeding 08/28/2015  . Status asthmaticus 07/06/2012  . Acute respiratory failure (HCC) 07/06/2012  . Asthma exacerbation 07/05/2012    Assessment: Sharon Nash is a 20 y.o. G2P1001 at 7018w2d here for SOL  #Labor:Active, progressing well #Pain: Okay with trial fentanyl, NO, does not want epidural #FWB: Cat-1 #ID:  GBS negative,varicella non-immune #MOF: Breast/Formula #MOC:Nexplanon #Circ:  Female, not necessary  Sharon Nash 03/02/2016, 5:08 AM   OB fellow attestation:   I agree with above documentation in the resident's note.   Sharon Nash is a 20 y.o. G2P1001 here for SOL  PE: BP 127/75 mmHg  Pulse 77  Temp(Src) 98 F (36.7 C) (Oral)  Resp 20  Ht 5\' 7"  (1.702 m)  Wt 81.647 kg (180 lb)  BMI 28.19 kg/m2  SpO2 94%  LMP 05/12/2015 Gen: calm comfortable, NAD Resp: normal effort, no distress Abd: gravid  ROS, labs, PMH reviewed  [facility] Anticipate SVD  Sharon Nash 03/02/2016, 9:58 AM

## 2016-03-02 NOTE — Progress Notes (Signed)
Patient states she has passed 2 large clots earlier today, one of which seen by nurse, but no record of either in assessments.

## 2016-03-02 NOTE — Progress Notes (Signed)
Utilization review completed.  L. J. Adriyanna Christians RN, BSN, CM 

## 2016-03-03 MED ORDER — RHO D IMMUNE GLOBULIN 1500 UNIT/2ML IJ SOSY
300.0000 ug | PREFILLED_SYRINGE | Freq: Once | INTRAMUSCULAR | Status: DC
  Filled 2016-03-03: qty 2

## 2016-03-03 MED ORDER — RHO D IMMUNE GLOBULIN 1500 UNIT/2ML IJ SOSY
300.0000 ug | PREFILLED_SYRINGE | Freq: Once | INTRAMUSCULAR | Status: AC
  Administered 2016-03-03: 300 ug via INTRAMUSCULAR
  Filled 2016-03-03: qty 2

## 2016-03-03 NOTE — Progress Notes (Signed)
Spoke with patient yesterday about her asthma and being a candidate for the pneumonia vaccine. Pt states she has had the vaccine before through her primary care doctor, but doesn't remember when she received it. Pt is also 20 years old, and without her knowing what year she received the vaccine, we can't administer it at this time. Pt was encouraged to reach out to her primary care physician and follow up with them in regards to pneumonia vaccine.  Lucy ChrisJaime Patirica Longshore, RN

## 2016-03-03 NOTE — Clinical Social Work Maternal (Signed)
  CLINICAL SOCIAL WORK MATERNAL/CHILD NOTE  Patient Details  Name: Sharon Nash MRN: [id number] Date of Birth: 09/20/1995  Date:  03/03/2016  Clinical Social Worker Initiating Note:   (Josely Moffat lcsw) Date/ Time Initiated:  03/03/16/      Child's Name:      Legal Guardian:  Mother   Need for Interpreter:  None   Date of Referral:  03/02/16     Reason for Referral:  Current Substance Use/Substance Use During Pregnancy    Referral Source:  Physician   Address:   ([address] [place])  Phone number:  [phone]   Household Members:  Self, Minor Children   Natural Supports (not living in the home):  Spouse/significant other, Extended Family   Professional Supports: None   Employment: Unemployed   Type of Work:     Education:  Database administrator Resources:  Medicaid   Other Resources:  [employer], [employer]    Cultural/Religious Considerations Which May Impact Care: None identified.  Strengths:  Pediatrician chosen , Ability to meet basic needs    Risk Factors/Current Problems:  Substance Use  (mom with positive screen for thc in jan 2017)   Cognitive State:  Alert    Mood/Affect:  Anxious    CSW Assessment: CSW met with pt and FOB at bedside.  Pt was up in room, pacing and anxious to go home.  Pt was living in a hotel with her other child and plans to return there at d/c.  She states that the FOB is supportive, but that they don't live together.  Pt states that her mother lives locally and is also supportive of her and her family.  Pt denies needing anything to care for her baby and believes her living arrangement is prepared for baby.  Pt denies any mental health issues currently, but CSW did discuss PPD with her.  She is agreeable to f/u with her MD should she experience any changes in mood/behavior post-partum.  CSW discussed the need for her baby's urine and cord tissue to be tested based on her positive UDS for THC in 09/2015.  Pt  was surprised by this information, but did not comment on these results.  Pt did not identify any other social work needs.  CSW Plan/Description: CSW will follow for results of cord tissue test and proceed accordingly.      Roanna Raider, LCSW 03/03/2016, 3:18 PM

## 2016-03-03 NOTE — Discharge Instructions (Signed)

## 2016-03-03 NOTE — Discharge Summary (Signed)
OB Discharge Summary     Patient Name: Sharon Nash DOB: 12/17/1995 MRN: [id number]  Date of admission: 03/02/2016 Delivering MD: Shawna ClampBOOKER, Jerrye Seebeck R   Date of discharge: 03/03/2016  Admitting diagnosis: pain in labor Intrauterine pregnancy: 213w2d     Secondary diagnosis:  Active Problems:   Active labor at term  Additional problems: None     Discharge diagnosis: Term Pregnancy Delivered                                                                                                Post partum procedures:rhogam  Augmentation: AROM  Complications: None  Hospital course:  Onset of Labor With Vaginal Delivery     20 y.o. yo [id number] at 333w2d was admitted in Active Labor on 03/02/2016. Patient had an uncomplicated labor course as follows:  Membrane Rupture Time/Date: 6:10 AM ,03/02/2016   Intrapartum Procedures: Episiotomy: None [1]                                         Lacerations:  None [1]  Patient had a delivery of a Viable infant. 03/02/2016  Information for the patient's newborn:  Lorelee MarketHarrison, Girl Adean [[id number]][[id number]]  Delivery Method: Vaginal, Spontaneous Delivery (Filed from Delivery Summary)    Pateint had an uncomplicated postpartum course.  She is ambulating, tolerating a regular diet, passing flatus, and urinating well. Patient is discharged home in stable condition on 03/03/2016.    Physical exam  Filed Vitals:   03/02/16 1101 03/02/16 1130 03/02/16 1240 03/02/16 1600  BP: 110/86 113/60 118/57 121/68  Pulse: 89 75 90 80  Temp:  99.8 F (37.7 C) 99.4 F (37.4 C) 99.9 F (37.7 C)  TempSrc:  Oral Oral Oral  Resp: 16 18 18 18   Height:      Weight:      SpO2:  99% 99% 99%   General: alert, cooperative and no distress Lochia: appropriate Uterine Fundus: firm Incision: N/A DVT Evaluation: No evidence of DVT seen on physical exam. Labs: Lab Results  Component Value Date   WBC 6.4 03/02/2016   HGB 8.4* 03/02/2016   HCT 25.2* 03/02/2016   MCV 89.4  03/02/2016   PLT 208 03/02/2016   CMP Latest Ref Rng 11/03/2015  Glucose 65 - 99 mg/dL 88  BUN 6 - 20 mg/dL 13  Creatinine 1.300.44 - 8.651.00 mg/dL 7.840.56  Sodium 696135 - 295145 mmol/L 136  Potassium 3.5 - 5.1 mmol/L 3.9  Chloride 101 - 111 mmol/L 107  CO2 22 - 32 mmol/L 23  Calcium 8.9 - 10.3 mg/dL 2.8(U8.4(L)  Total Protein 6.5 - 8.1 g/dL 6.1(L)  Total Bilirubin 0.3 - 1.2 mg/dL <1.3(K<0.1(L)  Alkaline Phos 38 - 126 U/L 42  AST 15 - 41 U/L 13(L)  ALT 14 - 54 U/L 8(L)    Discharge instruction: per After Visit Summary and "Baby and Me Booklet".  After visit meds:    Medication List    TAKE these medications  albuterol 108 (90 Base) MCG/ACT inhaler  Commonly known as:  PROVENTIL HFA;VENTOLIN HFA  Inhale 2 puffs into the lungs every 4 (four) hours as needed for wheezing or shortness of breath.     albuterol (2.5 MG/3ML) 0.083% nebulizer solution  Commonly known as:  PROVENTIL  Take 3 mLs (2.5 mg total) by nebulization every 4 (four) hours as needed for wheezing or shortness of breath.     prenatal multivitamin Tabs tablet  Take 1 tablet by mouth daily at 12 noon.        Diet: routine diet  Activity: Advance as tolerated. Pelvic rest for 6 weeks.   Outpatient follow up:6 weeks Follow up Appt:No future appointments. Follow up Visit:No Follow-up on file.  Postpartum contraception: Nexplanon  Newborn Data: Live born female  Birth Weight: 7 lb 4.8 oz (3310 g) APGAR: 8, 9  Baby Feeding: Bottle and Breast Disposition:home with mother   03/03/2016 [name], MD  I spoke with and examined patient and agree with resident/PA/SNM's note and plan of care.  PPD#1, doing well, Eating, drinking, voiding, ambulating well.  +flatus.  Lochia and pain wnl.  Denies dizziness, lightheadedness, or sob. No complaints. Wants to go home today. Had hemorrhoid pain overnight- no evidence of thrombosed hemorrhoid on exam, does have 2 inflamed external hemorrhoids.  Abstinence until Nexplanon.   Cheral MarkerKimberly R. Sandralee Tarkington, CNM, [facility] CenterWHNP-BC 03/03/2016 9:16 AM

## 2016-03-04 LAB — RH IG WORKUP (INCLUDES ABO/RH)
ABO/RH(D): O NEG
FETAL SCREEN: NEGATIVE
Gestational Age(Wks): 38.2
UNIT DIVISION: 0

## 2016-04-04 ENCOUNTER — Encounter: Payer: Self-pay | Admitting: Advanced Practice Midwife

## 2016-04-04 ENCOUNTER — Ambulatory Visit: Payer: Medicaid Other | Admitting: Advanced Practice Midwife

## 2016-04-09 ENCOUNTER — Encounter: Payer: Self-pay | Admitting: *Deleted

## 2016-04-09 ENCOUNTER — Ambulatory Visit: Payer: Medicaid Other | Admitting: Women's Health

## 2016-06-29 ENCOUNTER — Emergency Department (HOSPITAL_COMMUNITY): Payer: Medicaid Other

## 2016-06-29 ENCOUNTER — Emergency Department (HOSPITAL_COMMUNITY)
Admission: EM | Admit: 2016-06-29 | Discharge: 2016-06-29 | Disposition: A | Discharge status: Home / Self Care | Payer: Medicaid Other | Attending: Emergency Medicine | Admitting: Emergency Medicine

## 2016-06-29 ENCOUNTER — Encounter (HOSPITAL_COMMUNITY): Payer: Self-pay | Admitting: Emergency Medicine

## 2016-06-29 DIAGNOSIS — R0602 Shortness of breath: Secondary | ICD-10-CM | POA: Diagnosis present

## 2016-06-29 DIAGNOSIS — J069 Acute upper respiratory infection, unspecified: Secondary | ICD-10-CM | POA: Insufficient documentation

## 2016-06-29 DIAGNOSIS — J45901 Unspecified asthma with (acute) exacerbation: Secondary | ICD-10-CM

## 2016-06-29 DIAGNOSIS — J4541 Moderate persistent asthma with (acute) exacerbation: Secondary | ICD-10-CM | POA: Diagnosis not present

## 2016-06-29 DIAGNOSIS — R062 Wheezing: Secondary | ICD-10-CM

## 2016-06-29 LAB — CBC WITH DIFFERENTIAL/PLATELET
BASOS ABS: 0 10*3/uL (ref 0.0–0.1)
Basophils Relative: 0 %
Eosinophils Absolute: 0.1 10*3/uL (ref 0.0–0.7)
Eosinophils Relative: 1 %
HEMATOCRIT: 38.8 % (ref 36.0–46.0)
HEMOGLOBIN: 13.1 g/dL (ref 12.0–15.0)
LYMPHS ABS: 1.5 10*3/uL (ref 0.7–4.0)
LYMPHS PCT: 17 %
MCH: 29.7 pg (ref 26.0–34.0)
MCHC: 33.8 g/dL (ref 30.0–36.0)
MCV: 88 fL (ref 78.0–100.0)
Monocytes Absolute: 1 10*3/uL (ref 0.1–1.0)
Monocytes Relative: 11 %
NEUTROS ABS: 6.3 10*3/uL (ref 1.7–7.7)
Neutrophils Relative %: 71 %
Platelets: 196 10*3/uL (ref 150–400)
RBC: 4.41 MIL/uL (ref 3.87–5.11)
RDW: 14.1 % (ref 11.5–15.5)
WBC: 8.9 10*3/uL (ref 4.0–10.5)

## 2016-06-29 LAB — BASIC METABOLIC PANEL
ANION GAP: 8 (ref 5–15)
BUN: 11 mg/dL (ref 6–20)
CHLORIDE: 108 mmol/L (ref 101–111)
CO2: 24 mmol/L (ref 22–32)
Calcium: 8.8 mg/dL — ABNORMAL LOW (ref 8.9–10.3)
Creatinine, Ser: 0.65 mg/dL (ref 0.44–1.00)
GFR calc Af Amer: >60 mL/min (ref >60)
GLUCOSE: 91 mg/dL (ref 65–99)
POTASSIUM: 3.4 mmol/L — AB (ref 3.5–5.1)
Sodium: 140 mmol/L (ref 135–145)

## 2016-06-29 MED ORDER — ALBUTEROL SULFATE (2.5 MG/3ML) 0.083% IN NEBU
5.0000 mg | INHALATION_SOLUTION | Freq: Once | RESPIRATORY_TRACT | Status: AC
  Administered 2016-06-29: 5 mg via RESPIRATORY_TRACT
  Filled 2016-06-29: qty 6

## 2016-06-29 MED ORDER — ALBUTEROL SULFATE (2.5 MG/3ML) 0.083% IN NEBU: 2.5000 mg | INHALATION_SOLUTION | RESPIRATORY_TRACT | 99 refills | Status: DC | PRN

## 2016-06-29 MED ORDER — IPRATROPIUM BROMIDE 0.02 % IN SOLN
0.5000 mg | Freq: Once | RESPIRATORY_TRACT | Status: AC
  Administered 2016-06-29: 0.5 mg via RESPIRATORY_TRACT
  Filled 2016-06-29: qty 2.5

## 2016-06-29 MED ORDER — ALBUTEROL SULFATE HFA 108 (90 BASE) MCG/ACT IN AERS: 2.0000 | INHALATION_SPRAY | RESPIRATORY_TRACT | 99 refills | Status: DC | PRN

## 2016-06-29 MED ORDER — PREDNISONE 20 MG PO TABS: 40.0000 mg | ORAL_TABLET | Freq: Every day | ORAL | 0 refills | Status: DC

## 2016-06-29 MED ORDER — TRIAMCINOLONE ACETONIDE 55 MCG/ACT NA AERO: 2.0000 | INHALATION_SPRAY | Freq: Every day | NASAL | 12 refills | Status: DC

## 2016-06-29 MED ORDER — POTASSIUM CHLORIDE CRYS ER 20 MEQ PO TBCR
40.0000 meq | EXTENDED_RELEASE_TABLET | Freq: Once | ORAL | Status: AC
  Administered 2016-06-29: 40 meq via ORAL
  Filled 2016-06-29: qty 2

## 2016-06-29 MED ORDER — METHYLPREDNISOLONE SODIUM SUCC 125 MG IJ SOLR
125.0000 mg | Freq: Once | INTRAMUSCULAR | Status: AC
  Administered 2016-06-29: 125 mg via INTRAVENOUS
  Filled 2016-06-29: qty 2

## 2016-06-29 MED ORDER — CETIRIZINE HCL 10 MG PO TABS: 10.0000 mg | ORAL_TABLET | Freq: Every day | ORAL | 1 refills | Status: DC

## 2016-06-29 NOTE — ED Triage Notes (Signed)
Pt reports asthma flare upset onset yesterday, worsening overnight. Unable to use neb treatment. Ambulatory, able to speak in complete sentences.  States neb machine is broken at home - needs assistance.

## 2016-06-29 NOTE — Discharge Instructions (Signed)
1. Medications: albuterol, prednisone, usual home medications °2. Treatment: rest, drink plenty of fluids, begin OTC antihistamine (Zyrtec or Claritin)  °3. Follow Up: Please followup with your primary doctor in 2-3 days for discussion of your diagnoses and further evaluation after today's visit; if you do not have a primary care doctor use the resource guide provided to find one; Please return to the ER for difficulty breathing, high fevers or worsening symptoms. ° °

## 2016-06-29 NOTE — ED Provider Notes (Signed)
[facility]-EMERGENCY DEPT Provider Note   CSN: [id number] Arrival date & time: 06/29/16  0122     History   Chief Complaint Chief Complaint  Patient presents with  . Asthma    HPI Sharon Nash is a 20 y.o. female with a hx of asthma with previous respiratory failure requiring bipap (pt denies intubation), chronic abd pain presents to the Emergency Department complaining of gradual, persistent, progressively worsening SOB and wheezing onset 2 days ago.  Pt reports she has been using her nebulizer at home with some relief until it broke tonight.  She reports associated URI symptoms including rhinorrhea, cough and subjective fevers.  She reports her daughter was diagnosed with pneumonia in the last few days.  Pt reports she is not taking any allergy medications.  Last set of steroids was several months ago.  Pt's last hospitalization has been several years.  Pt denies chills, headache, neck pain, chest pain, abd pain, N/V/D, weakness, dizziness, syncope.     The history is provided by the patient and medical records. No language interpreter was used.    Past Medical History:  Diagnosis Date  . Asthma   . Chronic abdominal pain   . Chronic knee pain   . Respiratory failure, acute (HCC) 06/2012   bipap only, not intubated, due to asthma exacerbation    Patient Active Problem List   Diagnosis Date Noted  . Active labor at term 03/02/2016  . Rh negative state in antepartum period 02/26/2016  . 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 Supervision of normal pregnancy 09/13/2015  . Hyperemesis arising during pregnancy 08/30/2015  . Upper gastrointestinal bleeding 08/28/2015  . Status asthmaticus 07/06/2012  . Acute respiratory failure (HCC) 07/06/2012  . Asthma exacerbation 07/05/2012    Past Surgical History:  Procedure Laterality Date  . None      OB History    Gravida Para Term Preterm AB Living   2 2 2  0 0 2   SAB TAB Ectopic Multiple Live Births   0 0 0 0 2       Home  Medications    Prior to Admission medications   Medication Sig Start Date End Date Taking? Authorizing Provider  albuterol (PROVENTIL HFA;VENTOLIN HFA) 108 (90 Base) MCG/ACT inhaler Inhale 2 puffs into the lungs every 4 (four) hours as needed for wheezing or shortness of breath. 06/29/16   Leanna Hamid, PA-C  albuterol (PROVENTIL) (2.5 MG/3ML) 0.083% nebulizer solution Take 3 mLs (2.5 mg total) by nebulization every 4 (four) hours as needed for wheezing or shortness of breath. 06/29/16   Quincy Boy, PA-C  cetirizine (ZYRTEC ALLERGY) 10 MG tablet Take 1 tablet (10 mg total) by mouth daily. 06/29/16   Marchella Hibbard, PA-C  predniSONE (DELTASONE) 20 MG tablet Take 2 tablets (40 mg total) by mouth daily. 06/29/16   Dahlia Client Meghin Thivierge, PA-C    Family History Family History  Problem Relation Age of Onset  . Asthma Sister   . Diabetes Maternal Grandmother   . Hypertension Maternal Grandmother   . Diabetes Maternal Grandfather   . Asthma Maternal Grandfather   . Colon cancer Neg Hx   . Breast cancer Maternal Aunt     Social History Social History  Substance Use Topics  . Smoking status: Never Smoker  . Smokeless tobacco: Never Used  . Alcohol use No     Allergies   Shrimp [shellfish allergy]   Review of Systems Review of Systems  HENT: Positive for congestion, rhinorrhea and sore throat.  Respiratory: Positive for cough, chest tightness, shortness of breath and wheezing.   All other systems reviewed and are negative.    Physical Exam Updated Vital Signs BP 136/95   Pulse 97   Temp 98.5 F (36.9 C) (Oral)   Resp 23   Ht 5\' 8"  (1.727 m)   Wt 74.8 kg   LMP  (LMP Unknown)   SpO2 100%   BMI 25.09 kg/m   Physical Exam  Constitutional: She appears well-developed and well-nourished. No distress.  Awake, alert, nontoxic appearance  HENT:  Head: Normocephalic and atraumatic.  Right Ear: Tympanic membrane, external ear and ear canal normal.  Left  Ear: Tympanic membrane, external ear and ear canal normal.  Nose: Mucosal edema and rhinorrhea present. No epistaxis. Right sinus exhibits no maxillary sinus tenderness and no frontal sinus tenderness. Left sinus exhibits no maxillary sinus tenderness and no frontal sinus tenderness.  Mouth/Throat: Uvula is midline, oropharynx is clear and moist and mucous membranes are normal. Mucous membranes are not pale and not cyanotic. No oropharyngeal exudate, posterior oropharyngeal edema, posterior oropharyngeal erythema or tonsillar abscesses.  Eyes: Conjunctivae are normal. Pupils are equal, round, and reactive to light. No scleral icterus.  Neck: Normal range of motion and full passive range of motion without pain. Neck supple.  Cardiovascular: Normal rate, regular rhythm and intact distal pulses.   Pulmonary/Chest: Accessory muscle usage ( mild) present. No stridor. Tachypnea noted. No respiratory distress. She has decreased breath sounds ( throughout). She has wheezes ( throughout).  Abdominal: Soft. Bowel sounds are normal. She exhibits no mass. There is no tenderness. There is no rebound and no guarding.  Musculoskeletal: Normal range of motion. She exhibits no edema.  Lymphadenopathy:    She has no cervical adenopathy.  Neurological: She is alert.  Speech is clear and goal oriented Moves extremities without ataxia  Skin: Skin is warm and dry. No rash noted. She is not diaphoretic.  Psychiatric: She has a normal mood and affect.  Nursing note and vitals reviewed.    ED Treatments / Results  Labs (all labs ordered are listed, but only abnormal results are displayed) Labs Reviewed  BASIC METABOLIC PANEL - Abnormal; Notable for the following:       Result Value   Potassium 3.4 (*)    Calcium 8.8 (*)    All other components within normal limits  CBC WITH DIFFERENTIAL/PLATELET    Radiology Dg Chest 2 View  Result Date: 06/29/2016 CLINICAL DATA:  Shortness of breath, coughing congestion  for 1 day. Fever. History of asthma. EXAM: CHEST  2 VIEW COMPARISON:  Chest radiograph December 19, 2014 FINDINGS: Cardiomediastinal silhouette is normal. No pleural effusions or focal consolidations. Mild bronchitic changes. Trachea projects midline and there is no pneumothorax. Soft tissue planes and included osseous structures are non-suspicious. IMPRESSION: Mild bronchitic changes. Electronically Signed   By: Awilda Metroourtnay  Bloomer M.D.   On: 06/29/2016 02:43    Procedures Procedures (including critical care time)  Medications Ordered in ED Medications  albuterol (PROVENTIL) (2.5 MG/3ML) 0.083% nebulizer solution 5 mg (5 mg Nebulization Given 06/29/16 0138)  ipratropium (ATROVENT) nebulizer solution 0.5 mg (0.5 mg Nebulization Given 06/29/16 0147)  methylPREDNISolone sodium succinate (SOLU-MEDROL) 125 mg/2 mL injection 125 mg (125 mg Intravenous Given 06/29/16 0202)  ipratropium (ATROVENT) nebulizer solution 0.5 mg (0.5 mg Nebulization Given 06/29/16 0341)  albuterol (PROVENTIL) (2.5 MG/3ML) 0.083% nebulizer solution 5 mg (5 mg Nebulization Given 06/29/16 0341)  potassium chloride SA (K-DUR,KLOR-CON) CR tablet 40 mEq (40 mEq  Oral Given 06/29/16 0341)     Initial Impression / Assessment and Plan / ED Course  I have reviewed the triage vital signs and the nursing notes.  Pertinent labs & imaging results that were available during my care of the patient were reviewed by me and considered in my medical decision making (see chart for details).  Clinical Course  Value Comment By Time   Breath sounds improved, but expiratory wheezes persist.  Will give additional treatment Dierdre Forth, PA-C 10/21 0320  DG Chest 2 View Bronchitic changes without pneumonia Dierdre Forth, PA-C 10/21 0321  BP: 136/95 VSS Verlie Liotta, PA-C 10/21 0321  Potassium: (!) 3.4 Mild, replaced in the department, suspect secondary to albuterol usage Dierdre Forth, PA-C 10/21 0325   Pt reports she is  breathing at baseline.  Repeat exam shows lungs are clear and equal.  Pt now with mild tachycardia at rest, most likely from the albuterol [facility], PA-C 10/21 0448    Patient  with O2 saturations maintained >90, no current signs of respiratory distress. Lung exam improved after nebulizer treatment. Prednisone given in the ED and pt will be dc with 5 day burst. Pt states they are breathing at baseline. Pt has been instructed to continue using prescribed medications and to speak with PCP about today's exacerbation. Refills given for albuterol.     Final Clinical Impressions(s) / ED Diagnoses   Final diagnoses:  Moderate asthma with exacerbation, unspecified whether persistent  Upper respiratory tract infection, unspecified type  Wheezing    New Prescriptions New Prescriptions   CETIRIZINE (ZYRTEC ALLERGY) 10 MG TABLET    Take 1 tablet (10 mg total) by mouth daily.   PREDNISONE (DELTASONE) 20 MG TABLET    Take 2 tablets (40 mg total) by mouth daily.     Dahlia Client Analiese Krupka, PA-C 06/29/16 0453    Layla Maw Ward, DO 06/29/16 8119

## 2016-06-29 NOTE — ED Notes (Signed)
Pt transported to xray 

## 2016-09-20 ENCOUNTER — Encounter (HOSPITAL_COMMUNITY): Payer: Self-pay

## 2016-09-20 ENCOUNTER — Emergency Department (HOSPITAL_COMMUNITY)
Admission: EM | Admit: 2016-09-20 | Discharge: 2016-09-21 | Disposition: A | Discharge status: Home / Self Care | Payer: Medicaid Other | Attending: Emergency Medicine | Admitting: Emergency Medicine

## 2016-09-20 DIAGNOSIS — S00451A Superficial foreign body of right ear, initial encounter: Secondary | ICD-10-CM

## 2016-09-20 DIAGNOSIS — Y939 Activity, unspecified: Secondary | ICD-10-CM | POA: Diagnosis not present

## 2016-09-20 DIAGNOSIS — N760 Acute vaginitis: Secondary | ICD-10-CM | POA: Insufficient documentation

## 2016-09-20 DIAGNOSIS — T161XXA Foreign body in right ear, initial encounter: Secondary | ICD-10-CM | POA: Diagnosis not present

## 2016-09-20 DIAGNOSIS — N72 Inflammatory disease of cervix uteri: Secondary | ICD-10-CM | POA: Diagnosis not present

## 2016-09-20 DIAGNOSIS — J45909 Unspecified asthma, uncomplicated: Secondary | ICD-10-CM | POA: Diagnosis not present

## 2016-09-20 DIAGNOSIS — H9201 Otalgia, right ear: Secondary | ICD-10-CM | POA: Diagnosis present

## 2016-09-20 DIAGNOSIS — Y929 Unspecified place or not applicable: Secondary | ICD-10-CM | POA: Insufficient documentation

## 2016-09-20 DIAGNOSIS — Y999 Unspecified external cause status: Secondary | ICD-10-CM | POA: Diagnosis not present

## 2016-09-20 DIAGNOSIS — W458XXA Other foreign body or object entering through skin, initial encounter: Secondary | ICD-10-CM | POA: Insufficient documentation

## 2016-09-20 DIAGNOSIS — B9689 Other specified bacterial agents as the cause of diseases classified elsewhere: Secondary | ICD-10-CM

## 2016-09-20 LAB — URINALYSIS, ROUTINE W REFLEX MICROSCOPIC
Bilirubin Urine: NEGATIVE
GLUCOSE, UA: NEGATIVE mg/dL
HGB URINE DIPSTICK: NEGATIVE
KETONES UR: NEGATIVE mg/dL
Nitrite: NEGATIVE
PROTEIN: 30 mg/dL — AB
Specific Gravity, Urine: 1.027 (ref 1.005–1.030)
pH: 6 (ref 5.0–8.0)

## 2016-09-20 NOTE — ED Triage Notes (Signed)
Per pt, Pt has a piercing to her right ear that needs to be removed due to pain. Pt also complains of vaginal discharge that started two days ago. Reports to be white with no odor.

## 2016-09-21 ENCOUNTER — Encounter (HOSPITAL_COMMUNITY): Payer: Self-pay | Admitting: Emergency Medicine

## 2016-09-21 LAB — I-STAT BETA HCG BLOOD, ED (MC, WL, AP ONLY)

## 2016-09-21 LAB — WET PREP, GENITAL
Sperm: NONE SEEN
Trich, Wet Prep: NONE SEEN
Yeast Wet Prep HPF POC: NONE SEEN

## 2016-09-21 MED ORDER — CEFTRIAXONE SODIUM 250 MG IJ SOLR
250.0000 mg | Freq: Once | INTRAMUSCULAR | Status: AC
  Administered 2016-09-21: 250 mg via INTRAMUSCULAR
  Filled 2016-09-21: qty 250

## 2016-09-21 MED ORDER — AZITHROMYCIN 250 MG PO TABS
1000.0000 mg | ORAL_TABLET | Freq: Once | ORAL | Status: AC
  Administered 2016-09-21: 1000 mg via ORAL
  Filled 2016-09-21: qty 4

## 2016-09-21 MED ORDER — LIDOCAINE HCL (PF) 1 % IJ SOLN
INTRAMUSCULAR | Status: AC
  Administered 2016-09-21: 5 mL
  Filled 2016-09-21: qty 5

## 2016-09-21 MED ORDER — METRONIDAZOLE 500 MG PO TABS: 500.0000 mg | ORAL_TABLET | Freq: Two times a day (BID) | ORAL | 0 refills | Status: DC

## 2016-09-21 NOTE — ED Provider Notes (Signed)
[facility]-EM[facility]Y DEPT Provider Note   CSN: [id number] Arrival date & time: 09/20/16  1825     History   Chief Complaint Chief Complaint  Patient presents with  . Ear Pain  . Vaginal Discharge    HPI Sharon Nash is a 21 y.o. female.  HPI   21 year old female presenting with multiple complaints. Patient reports she had her right ear pierced 7 months ago. For the past 34 months she has had pain at the piercing site as well as keloid formation. Pain is an achy throbbing sensation that is persistent worsening with palpation. No discharge noted and no change in hearing. She is here requesting to have her piercing removed. Furthermore, she also noticed some mild vaginal discharge since yesterday. No associated abdominal pain, back pain, dysuria, vaginal bleeding, pain with sexual activities, or rash. She did notice some blood in the urine for the past few days. She denies any prior history of STDs. No recent change in sexual partner.  Past Medical History:  Diagnosis Date  . Asthma   . Chronic abdominal pain   . Chronic knee pain   . Respiratory failure, acute (HCC) 06/2012   bipap only, not intubated, due to asthma exacerbation    Patient Active Problem List   Diagnosis Date Noted  . Active labor at term 03/02/2016  . Rh negative state in antepartum period 02/26/2016  . 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 Supervision of normal pregnancy 09/13/2015  . Hyperemesis arising during pregnancy 08/30/2015  . Upper gastrointestinal bleeding 08/28/2015  . Status asthmaticus 07/06/2012  . Acute respiratory failure (HCC) 07/06/2012  . Asthma exacerbation 07/05/2012    Past Surgical History:  Procedure Laterality Date  . None      OB History    Gravida Para Term Preterm AB Living   2 2 2  0 0 2   SAB TAB Ectopic Multiple Live Births   0 0 0 0 2       Home Medications    Prior to Admission medications   Medication Sig Start Date End Date Taking? Authorizing Provider    albuterol (PROVENTIL HFA;VENTOLIN HFA) 108 (90 Base) MCG/ACT inhaler Inhale 2 puffs into the lungs every 4 (four) hours as needed for wheezing or shortness of breath. 06/29/16   Hannah Muthersbaugh, PA-C  albuterol (PROVENTIL) (2.5 MG/3ML) 0.083% nebulizer solution Take 3 mLs (2.5 mg total) by nebulization every 4 (four) hours as needed for wheezing or shortness of breath. 06/29/16   Hannah Muthersbaugh, PA-C  cetirizine (ZYRTEC ALLERGY) 10 MG tablet Take 1 tablet (10 mg total) by mouth daily. 06/29/16   Hannah Muthersbaugh, PA-C  predniSONE (DELTASONE) 20 MG tablet Take 2 tablets (40 mg total) by mouth daily. 06/29/16   Hannah Muthersbaugh, PA-C  triamcinolone (NASACORT AQ) 55 MCG/ACT AERO nasal inhaler Place 2 sprays into the nose daily. 06/29/16   Dahlia Client Muthersbaugh, PA-C    Family History Family History  Problem Relation Age of Onset  . Asthma Sister   . Diabetes Maternal Grandmother   . Hypertension Maternal Grandmother   . Diabetes Maternal Grandfather   . Asthma Maternal Grandfather   . Breast cancer Maternal Aunt   . Colon cancer Neg Hx     Social History Social History  Substance Use Topics  . Smoking status: Never Smoker  . Smokeless tobacco: Never Used  . Alcohol use Yes     Allergies   Shrimp [shellfish allergy]   Review of Systems Review of Systems  All other systems reviewed  and are negative.    Physical Exam Updated Vital Signs BP 142/63 (BP Location: Right Arm)   Pulse 87   Temp 98.5 F (36.9 C) (Oral)   Resp 18   Ht 5\' 8"  (1.727 m)   Wt 72.6 kg   SpO2 97%   BMI 24.33 kg/m   Physical Exam  Constitutional: She appears well-developed and well-nourished. No distress.  HENT:  Head: Atraumatic.  Right ear: External metal bar ear pierce note to the helix of the ear with keloid formation at the piercing site, tender to palpation but no erythema or abscess noted.    Eyes: Conjunctivae are normal.  Neck: Neck supple.  Cardiovascular: Normal rate and  regular rhythm.   Pulmonary/Chest: Effort normal and breath sounds normal.  Abdominal: Soft. She exhibits no distension. There is no tenderness.  Genitourinary:  Genitourinary Comments: Chaperone present during exam. No inguinal lymphadenopathy or inguinal hernia. Normal external genitalia. Mild discomfort with insertion of speculum. Moderate amount of functional discharge noted in vaginal vault. Closed cervical os visualized.  On bimanual examination pt has bilateral adnexal tenderness without CMT.  Neurological: She is alert.  Skin: No rash noted.  Psychiatric: She has a normal mood and affect.  Nursing note and vitals reviewed.    ED Treatments / Results  Labs (all labs ordered are listed, but only abnormal results are displayed) Labs Reviewed  WET PREP, GENITAL - Abnormal; Notable for the following:       Result Value   Clue Cells Wet Prep HPF POC PRESENT (*)    WBC, Wet Prep HPF POC MANY (*)    All other components within normal limits  URINALYSIS, ROUTINE W REFLEX MICROSCOPIC - Abnormal; Notable for the following:    APPearance CLOUDY (*)    Protein, ur 30 (*)    Leukocytes, UA LARGE (*)    Bacteria, UA RARE (*)    Squamous Epithelial / LPF 6-30 (*)    All other components within normal limits  RAPID HIV SCREEN (HIV 1/2 AB+AG)  RPR  I-STAT BETA HCG BLOOD, ED ([facility], [facility], [facility] ONLY)  GC/CHLAMYDIA PROBE AMP ([place]) NOT AT [facility]    EKG  EKG Interpretation None       Radiology No results found.  Procedures Procedures (including critical care time)  Medications Ordered in ED Medications  cefTRIAXone (ROCEPHIN) injection 250 mg (250 mg Intramuscular Given 09/21/16 0221)  azithromycin (ZITHROMAX) tablet 1,000 mg (1,000 mg Oral Given 09/21/16 0213)  lidocaine (PF) (XYLOCAINE) 1 % injection (5 mLs  Given 09/21/16 0219)     Initial Impression / Assessment and Plan / ED Course  I have reviewed the triage vital signs and the nursing notes.  Pertinent labs & imaging  results that were available during my care of the patient were reviewed by me and considered in my medical decision making (see chart for details).  Clinical Course     BP 136/85   Pulse 87   Temp 98.5 F (36.9 C) (Oral)   Resp 18   Ht 5\' 8"  (1.727 m)   Wt 72.6 kg   SpO2 97%   BMI 24.33 kg/m    Final Clinical Impressions(s) / ED Diagnoses   Final diagnoses:  Foreign body of right ear lobe, initial encounter  BV (bacterial vaginosis)  Cervicitis    New Prescriptions New Prescriptions   METRONIDAZOLE (FLAGYL) 500 MG TABLET    Take 1 tablet (500 mg total) by mouth 2 (two) times daily.   1:53 AM  Patient here requesting for removal of her right ear piercing. It is a metal looped bar with keloid formation at insertion site.  Successful removal of the bar using a ring cutter.  Pt report having vaginal discharge. Does have tenderness on pelvic exam suggestive of cervicitis.  Rocephin/zithromax given.      Fayrene Helper, PA-C 09/21/16 0414    Melene Plan, DO 09/21/16 [phone]

## 2016-09-23 LAB — GC/CHLAMYDIA PROBE AMP (~~LOC~~) NOT AT ARMC
CHLAMYDIA, DNA PROBE: POSITIVE — AB
Neisseria Gonorrhea: POSITIVE — AB

## 2016-11-22 ENCOUNTER — Encounter: Payer: Self-pay | Admitting: Family Medicine

## 2017-01-22 ENCOUNTER — Encounter (HOSPITAL_COMMUNITY): Payer: Self-pay | Admitting: *Deleted

## 2017-01-22 ENCOUNTER — Emergency Department (HOSPITAL_COMMUNITY)
Admission: EM | Admit: 2017-01-22 | Discharge: 2017-01-22 | Disposition: A | Discharge status: Home / Self Care | Payer: Medicaid Other | Attending: Emergency Medicine | Admitting: Emergency Medicine

## 2017-01-22 DIAGNOSIS — R062 Wheezing: Secondary | ICD-10-CM | POA: Diagnosis present

## 2017-01-22 DIAGNOSIS — J4541 Moderate persistent asthma with (acute) exacerbation: Secondary | ICD-10-CM | POA: Insufficient documentation

## 2017-01-22 LAB — COMPREHENSIVE METABOLIC PANEL
ALBUMIN: 4 g/dL (ref 3.5–5.0)
ALT: 14 U/L (ref 14–54)
AST: 38 U/L (ref 15–41)
Alkaline Phosphatase: 69 U/L (ref 38–126)
Anion gap: 11 (ref 5–15)
BUN: 13 mg/dL (ref 6–20)
CHLORIDE: 106 mmol/L (ref 101–111)
CO2: 23 mmol/L (ref 22–32)
Calcium: 9.5 mg/dL (ref 8.9–10.3)
Creatinine, Ser: 0.77 mg/dL (ref 0.44–1.00)
GFR calc Af Amer: >60 mL/min (ref >60)
GFR calc non Af Amer: >60 mL/min (ref >60)
GLUCOSE: 104 mg/dL — AB (ref 65–99)
POTASSIUM: 3.3 mmol/L — AB (ref 3.5–5.1)
Sodium: 140 mmol/L (ref 135–145)
Total Bilirubin: 0.9 mg/dL (ref 0.3–1.2)
Total Protein: 7.3 g/dL (ref 6.5–8.1)

## 2017-01-22 LAB — CBC
HEMATOCRIT: 39 % (ref 36.0–46.0)
Hemoglobin: 12.6 g/dL (ref 12.0–15.0)
MCH: 28.7 pg (ref 26.0–34.0)
MCHC: 32.3 g/dL (ref 30.0–36.0)
MCV: 88.8 fL (ref 78.0–100.0)
Platelets: 326 10*3/uL (ref 150–400)
RBC: 4.39 MIL/uL (ref 3.87–5.11)
RDW: 13.2 % (ref 11.5–15.5)
WBC: 8 10*3/uL (ref 4.0–10.5)

## 2017-01-22 LAB — LIPASE, BLOOD: LIPASE: 17 U/L (ref 11–51)

## 2017-01-22 MED ORDER — PREDNISONE 50 MG PO TABS: 50.0000 mg | ORAL_TABLET | Freq: Every day | ORAL | 0 refills | Status: DC

## 2017-01-22 MED ORDER — ONDANSETRON 4 MG PO TBDP
ORAL_TABLET | ORAL | Status: AC
  Filled 2017-01-22: qty 1

## 2017-01-22 MED ORDER — GUAIFENESIN ER 1200 MG PO TB12: 1.0000 | ORAL_TABLET | Freq: Two times a day (BID) | ORAL | 0 refills | Status: DC

## 2017-01-22 MED ORDER — ALBUTEROL SULFATE (2.5 MG/3ML) 0.083% IN NEBU
5.0000 mg | INHALATION_SOLUTION | Freq: Once | RESPIRATORY_TRACT | Status: AC
  Administered 2017-01-22: 5 mg via RESPIRATORY_TRACT

## 2017-01-22 MED ORDER — ALBUTEROL SULFATE (2.5 MG/3ML) 0.083% IN NEBU
INHALATION_SOLUTION | RESPIRATORY_TRACT | Status: AC
  Filled 2017-01-22: qty 6

## 2017-01-22 MED ORDER — IPRATROPIUM BROMIDE 0.02 % IN SOLN
0.5000 mg | Freq: Once | RESPIRATORY_TRACT | Status: AC
  Administered 2017-01-22: 0.5 mg via RESPIRATORY_TRACT
  Filled 2017-01-22: qty 2.5

## 2017-01-22 MED ORDER — PREDNISONE 20 MG PO TABS
60.0000 mg | ORAL_TABLET | Freq: Once | ORAL | Status: AC
  Administered 2017-01-22: 60 mg via ORAL
  Filled 2017-01-22: qty 3

## 2017-01-22 MED ORDER — ALBUTEROL SULFATE (2.5 MG/3ML) 0.083% IN NEBU
5.0000 mg | INHALATION_SOLUTION | Freq: Once | RESPIRATORY_TRACT | Status: AC
  Administered 2017-01-22: 5 mg via RESPIRATORY_TRACT
  Filled 2017-01-22: qty 6

## 2017-01-22 MED ORDER — ONDANSETRON 4 MG PO TBDP
4.0000 mg | ORAL_TABLET | Freq: Once | ORAL | Status: AC
  Administered 2017-01-22: 4 mg via ORAL

## 2017-01-22 NOTE — ED Triage Notes (Signed)
Pt c/o SOB ongoing all day with x5 uses of albuterol inhaler today, pt reports vomiting onset x 1 hr with bil upper & lower abd cramping, denies diarrhea, pt labored breathing in triage, tachypnea, pt answers questions with short answers, hx of asthma

## 2017-01-22 NOTE — ED Provider Notes (Signed)
[facility] Provider Note   CSN: [id number] Arrival date & time: 01/22/17  1836     History   Chief Complaint No chief complaint on file.   HPI Sharon Nash is a 21 y.o. female.  HPI Patient presents to the emergency department with worsening in her asthma.  The patient states that she feels, like she has been having use her inhaler more over the last week.  He should states that her wheezing has gotten worse today.  Patient states that nothing seems to make the condition better or worse.  She states this time of year, seems to exacerbate her asthma. The patient denies chest pain, headache,blurred vision, neck pain, fever, cough, weakness, numbness, dizziness, anorexia, edema, abdominal pain, nausea, vomiting, diarrhea, rash, back pain, dysuria, hematemesis, bloody stool, near syncope, or syncope. Past Medical History:  Diagnosis Date  . Asthma   . Chronic abdominal pain   . Chronic knee pain   . Respiratory failure, acute (HCC) 06/2012   bipap only, not intubated, due to asthma exacerbation    Patient Active Problem List   Diagnosis Date Noted  . Active labor at term 03/02/2016  . Rh negative state in antepartum period 02/26/2016  . 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 Supervision of normal pregnancy 09/13/2015  . Hyperemesis arising during pregnancy 08/30/2015  . Upper gastrointestinal bleeding 08/28/2015  . Status asthmaticus 07/06/2012  . Acute respiratory failure (HCC) 07/06/2012  . Asthma exacerbation 07/05/2012    Past Surgical History:  Procedure Laterality Date  . None      OB History    Gravida Para Term Preterm AB Living   2 2 2  0 0 2   SAB TAB Ectopic Multiple Live Births   0 0 0 0 2       Home Medications    Prior to Admission medications   Medication Sig Start Date End Date Taking? Authorizing Provider  albuterol (PROVENTIL HFA;VENTOLIN HFA) 108 (90 Base) MCG/ACT inhaler Inhale 2 puffs into the lungs every 4 (four) hours as needed  for wheezing or shortness of breath. 06/29/16   Muthersbaugh, Dahlia Client, PA-C  albuterol (PROVENTIL) (2.5 MG/3ML) 0.083% nebulizer solution Take 3 mLs (2.5 mg total) by nebulization every 4 (four) hours as needed for wheezing or shortness of breath. 06/29/16   Muthersbaugh, Dahlia Client, PA-C  cetirizine (ZYRTEC ALLERGY) 10 MG tablet Take 1 tablet (10 mg total) by mouth daily. 06/29/16   Muthersbaugh, Dahlia Client, PA-C  metroNIDAZOLE (FLAGYL) 500 MG tablet Take 1 tablet (500 mg total) by mouth 2 (two) times daily. 09/21/16   Fayrene Helper, PA-C  predniSONE (DELTASONE) 20 MG tablet Take 2 tablets (40 mg total) by mouth daily. 06/29/16   Muthersbaugh, Dahlia Client, PA-C  triamcinolone (NASACORT AQ) 55 MCG/ACT AERO nasal inhaler Place 2 sprays into the nose daily. 06/29/16   Muthersbaugh, Dahlia Client, PA-C    Family History Family History  Problem Relation Age of Onset  . Asthma Sister   . Diabetes Maternal Grandmother   . Hypertension Maternal Grandmother   . Diabetes Maternal Grandfather   . Asthma Maternal Grandfather   . Breast cancer Maternal Aunt   . Colon cancer Neg Hx     Social History Social History  Substance Use Topics  . Smoking status: Never Smoker  . Smokeless tobacco: Never Used  . Alcohol use Yes     Allergies   Shrimp [shellfish allergy]   Review of Systems Review of Systems All other systems negative except as documented in the HPI. All  pertinent positives and negatives as reviewed in the HPI.  Physical Exam Updated Vital Signs BP (!) 139/94   Pulse (!) 111   Temp 97.7 F (36.5 C) (Oral)   Resp (!) 33   LMP 01/19/2017   SpO2 96%   Breastfeeding? No   Physical Exam  Constitutional: She is oriented to person, place, and time. She appears well-developed and well-nourished. No distress.  HENT:  Head: Normocephalic and atraumatic.  Mouth/Throat: Oropharynx is clear and moist.  Eyes: Pupils are equal, round, and reactive to light.  Neck: Normal range of motion. Neck supple.    Cardiovascular: Normal rate, regular rhythm and normal heart sounds.  Exam reveals no gallop and no friction rub.   No murmur heard. Pulmonary/Chest: Effort normal and breath sounds normal. No respiratory distress. She has no wheezes.  Neurological: She is alert and oriented to person, place, and time. She exhibits normal muscle tone. Coordination normal.  Skin: Skin is warm and dry. Capillary refill takes less than 2 seconds. No rash noted. No erythema.  Psychiatric: She has a normal mood and affect. Her behavior is normal.  Nursing note and vitals reviewed.    ED Treatments / Results  Labs (all labs ordered are listed, but only abnormal results are displayed) Labs Reviewed  COMPREHENSIVE METABOLIC PANEL - Abnormal; Notable for the following:       Result Value   Potassium 3.3 (*)    Glucose, Bld 104 (*)    All other components within normal limits  LIPASE, BLOOD  CBC  URINALYSIS, ROUTINE W REFLEX MICROSCOPIC    EKG  EKG Interpretation None       Radiology No results found.  Procedures Procedures (including critical care time)  Medications Ordered in ED Medications  albuterol (PROVENTIL) (2.5 MG/3ML) 0.083% nebulizer solution 5 mg (5 mg Nebulization Given 01/22/17 1848)  ondansetron (ZOFRAN-ODT) disintegrating tablet 4 mg (4 mg Oral Given 01/22/17 1854)  predniSONE (DELTASONE) tablet 60 mg (60 mg Oral Given 01/22/17 2016)  albuterol (PROVENTIL) (2.5 MG/3ML) 0.083% nebulizer solution 5 mg (5 mg Nebulization Given 01/22/17 2018)  ipratropium (ATROVENT) nebulizer solution 0.5 mg (0.5 mg Nebulization Given 01/22/17 2019)     Initial Impression / Assessment and Plan / ED Course  I have reviewed the triage vital signs and the nursing notes.  Pertinent labs & imaging results that were available during my care of the patient were reviewed by me and considered in my medical decision making (see chart for details).    Patient is not having any wheezing here in the emergency  department.  We did give her nebulized treatment to ensure that her breathing was maximized the patient will be discharged home with steroids.  Told to return here as needed.  Patient agrees the plan and all questions were answered.  She maintained her oxygen saturations on ambulation around the emergency department at 96% and above Final Clinical Impressions(s) / ED Diagnoses   Final diagnoses:  None    New Prescriptions New Prescriptions   No medications on file     Charlestine NightLawyer, Myreon Wimer, PA-C 01/23/17 0130    Arby BarrettePfeiffer, Marcy, MD 01/25/17 [phone]

## 2017-01-22 NOTE — ED Notes (Signed)
Pt stated unable to give urine sample at this time 

## 2017-01-22 NOTE — ED Notes (Signed)
Pt sats during ambulation remained at 96%. Pt ambulated with steady gait.

## 2017-01-22 NOTE — Discharge Instructions (Signed)
Follow-up with your primary care doctor.  Return here as needed °

## 2017-01-24 ENCOUNTER — Encounter: Payer: Self-pay | Admitting: Nurse Practitioner

## 2017-01-24 ENCOUNTER — Ambulatory Visit (INDEPENDENT_AMBULATORY_CARE_PROVIDER_SITE_OTHER): Payer: Medicaid Other | Admitting: Nurse Practitioner

## 2017-01-24 ENCOUNTER — Encounter: Payer: Self-pay | Admitting: Family Medicine

## 2017-01-24 VITALS — Temp 98.1°F | Wt 149.2 lb

## 2017-01-24 DIAGNOSIS — J310 Chronic rhinitis: Secondary | ICD-10-CM

## 2017-01-24 DIAGNOSIS — J4541 Moderate persistent asthma with (acute) exacerbation: Secondary | ICD-10-CM | POA: Diagnosis not present

## 2017-01-24 DIAGNOSIS — K219 Gastro-esophageal reflux disease without esophagitis: Secondary | ICD-10-CM

## 2017-01-24 DIAGNOSIS — J45901 Unspecified asthma with (acute) exacerbation: Secondary | ICD-10-CM | POA: Diagnosis not present

## 2017-01-24 DIAGNOSIS — J209 Acute bronchitis, unspecified: Secondary | ICD-10-CM | POA: Diagnosis not present

## 2017-01-24 MED ORDER — BUDESONIDE-FORMOTEROL FUMARATE 80-4.5 MCG/ACT IN AERO: 2.0000 | INHALATION_SPRAY | Freq: Two times a day (BID) | RESPIRATORY_TRACT | 2 refills | Status: DC

## 2017-01-24 MED ORDER — ALBUTEROL SULFATE (2.5 MG/3ML) 0.083% IN NEBU: 2.5000 mg | INHALATION_SOLUTION | RESPIRATORY_TRACT | 0 refills | Status: DC | PRN

## 2017-01-24 MED ORDER — CETIRIZINE HCL 10 MG PO TABS: 10.0000 mg | ORAL_TABLET | Freq: Every day | ORAL | 2 refills | Status: DC

## 2017-01-24 MED ORDER — FLUTICASONE PROPIONATE 50 MCG/ACT NA SUSP: 2.0000 | Freq: Every day | NASAL | 6 refills | Status: DC

## 2017-01-24 MED ORDER — PREDNISONE 20 MG PO TABS: ORAL_TABLET | ORAL | 0 refills | Status: DC

## 2017-01-24 MED ORDER — AZITHROMYCIN 250 MG PO TABS: ORAL_TABLET | ORAL | 0 refills | Status: DC

## 2017-01-24 MED ORDER — ALBUTEROL SULFATE (2.5 MG/3ML) 0.083% IN NEBU
2.5000 mg | INHALATION_SOLUTION | Freq: Once | RESPIRATORY_TRACT | Status: AC
  Administered 2017-01-24: 2.5 mg via RESPIRATORY_TRACT

## 2017-01-24 MED ORDER — PANTOPRAZOLE SODIUM 40 MG PO TBEC: 40.0000 mg | DELAYED_RELEASE_TABLET | Freq: Every day | ORAL | 2 refills | Status: DC

## 2017-01-27 ENCOUNTER — Other Ambulatory Visit: Payer: Self-pay | Admitting: Nurse Practitioner

## 2017-01-27 ENCOUNTER — Encounter: Payer: Self-pay | Admitting: Nurse Practitioner

## 2017-01-27 ENCOUNTER — Telehealth: Payer: Self-pay | Admitting: Nurse Practitioner

## 2017-01-27 DIAGNOSIS — K219 Gastro-esophageal reflux disease without esophagitis: Secondary | ICD-10-CM | POA: Insufficient documentation

## 2017-01-27 NOTE — Telephone Encounter (Signed)
done

## 2017-01-27 NOTE — Progress Notes (Addendum)
Subjective:  Presents for recheck of her asthma after an ED visit on 5/16. Was placed on oral prednisone, only mild improvement. No fever. Sore throat. Mild headache. Runny nose. Frequent cough now producing green mucus. Slight nausea with occasional posttussive vomiting. Mild diarrhea. Frequent wheezing. States she's used her albuterol 4 times already today. Is taking prednisone 50 mg daily. No ear pain. Taking fluids well. Voiding normal limit. Also complaints of upper mid abdominal pain. Positive caffeine intake. Nonsmoker. Patient drinking and orange soda while in the office that is high in caffeine.  Objective:   Temp 98.1 F (36.7 C) (Oral)   Wt 149 lb 3.2 oz (67.7 kg)   LMP 01/19/2017   SpO2 99%   BMI 22.69 kg/m  NAD. Alert, oriented. TMs clear effusion, no erythema. Pharynx injected with PND noted. Neck supple with mild soft anterior adenopathy. No tachypnea but mild shortness of breath with talking. Lungs initially faint inspiratory and expiratory wheezes noted with mildly diminished breath sounds in general. O2 sat 99% on room air. Given albuterol 2.5 mg nebulizer treatment. Continues to have some faint expiratory wheezes with improved airflow and subjective improvement of breathing. Faint scattered expiratory crackles noted. Occasional harsh bronchitic cough. Heart regular rate rhythm. Abdomen soft nondistended with mild epigastric area tenderness.  Assessment:   Problem List Items Addressed This Visit      Respiratory   Asthma exacerbation - Primary   Relevant Medications   albuterol (PROVENTIL) (2.5 MG/3ML) 0.083% nebulizer solution 2.5 mg (Completed)   albuterol (PROVENTIL) (2.5 MG/3ML) 0.083% nebulizer solution   predniSONE (DELTASONE) 20 MG tablet   budesonide-formoterol (SYMBICORT) 80-4.5 MCG/ACT inhaler     Digestive   Gastroesophageal reflux disease without esophagitis   Relevant Medications   pantoprazole (PROTONIX) 40 MG tablet    Other Visit Diagnoses    Acute  bronchitis, unspecified organism       Mixed rhinitis           Plan:  Meds ordered this encounter  Medications  . albuterol (PROVENTIL) (2.5 MG/3ML) 0.083% nebulizer solution 2.5 mg  . albuterol (PROVENTIL) (2.5 MG/3ML) 0.083% nebulizer solution    Sig: Take 3 mLs (2.5 mg total) by nebulization every 4 (four) hours as needed for wheezing or shortness of breath.    Dispense:  75 mL    Refill:  0    Order Specific Question:   Supervising Provider    Answer:   Merlyn AlbertLUKING, WILLIAM S [[id number]]  . cetirizine (ZYRTEC ALLERGY) 10 MG tablet    Sig: Take 1 tablet (10 mg total) by mouth daily.    Dispense:  30 tablet    Refill:  2    Order Specific Question:   Supervising Provider    Answer:   Merlyn AlbertLUKING, WILLIAM S [[id number]]  . fluticasone (FLONASE) 50 MCG/ACT nasal spray    Sig: Place 2 sprays into both nostrils daily.    Dispense:  16 g    Refill:  6    Order Specific Question:   Supervising Provider    Answer:   Merlyn AlbertLUKING, WILLIAM S [[id number]]  . pantoprazole (PROTONIX) 40 MG tablet    Sig: Take 1 tablet (40 mg total) by mouth daily. Prn acid reflux    Dispense:  30 tablet    Refill:  2    Order Specific Question:   Supervising Provider    Answer:   Merlyn AlbertLUKING, WILLIAM S [[id number]]  . predniSONE (DELTASONE) 20 MG tablet    Sig: 3 po qd x 3 d  then 2 po qd x 3 d then 1 po qd x 2 d    Dispense:  17 tablet    Refill:  0    Please remind patient to stop other prednisone prescription    Order Specific Question:   Supervising Provider    Answer:   Merlyn Albert [[id number]]  . azithromycin (ZITHROMAX Z-PAK) 250 MG tablet    Sig: Take 2 tablets (500 mg) on  Day 1,  followed by 1 tablet (250 mg) once daily on Days 2 through 5.    Dispense:  6 each    Refill:  0    Order Specific Question:   Supervising Provider    Answer:   Merlyn Albert [[id number]]  . budesonide-formoterol (SYMBICORT) 80-4.5 MCG/ACT inhaler    Sig: Inhale 2 puffs into the lungs 2 (two) times daily. To prevent wheezing    Dispense:  1 Inhaler     Refill:  2    Order Specific Question:   Supervising Provider    Answer:   Merlyn Albert [[id number]]   To deal with the urgent issue of asthma and bronchitis, stop current course of prednisone and switched to prednisone taper. Add Zithromax to regimen. Restart medications for rhinitis. Continue albuterol every 4 hours as directed when necessary. Start Protonix for reflux, discussed lifestyle measures including reducing her caffeine intake. Explained how this is probably adding to her cough and asthma symptoms. Patient has been having issues with her breathing for several months, start Symbicort as preventive measure. Explained that albuterol is her rescue medicine, patient verbalizes understanding. Go to ED over the weekend if worse, call back next week if no improvement. Over 50% of this visit was spent in discussion and teaching.

## 2017-01-27 NOTE — Telephone Encounter (Signed)
PRIOR AUTHORIZATION REQUIRED FOR SYMBICORT. FORM PLACED IN CAROLYN'S BOX.

## 2017-05-09 ENCOUNTER — Emergency Department (HOSPITAL_COMMUNITY): Payer: Medicaid Other

## 2017-05-09 ENCOUNTER — Emergency Department (HOSPITAL_COMMUNITY)
Admission: EM | Admit: 2017-05-09 | Discharge: 2017-05-09 | Disposition: A | Discharge status: Home / Self Care | Payer: Medicaid Other | Attending: Emergency Medicine | Admitting: Emergency Medicine

## 2017-05-09 ENCOUNTER — Encounter (HOSPITAL_COMMUNITY): Payer: Self-pay

## 2017-05-09 DIAGNOSIS — Z79899 Other long term (current) drug therapy: Secondary | ICD-10-CM | POA: Diagnosis not present

## 2017-05-09 DIAGNOSIS — J45909 Unspecified asthma, uncomplicated: Secondary | ICD-10-CM | POA: Diagnosis not present

## 2017-05-09 DIAGNOSIS — R197 Diarrhea, unspecified: Secondary | ICD-10-CM | POA: Insufficient documentation

## 2017-05-09 DIAGNOSIS — R1011 Right upper quadrant pain: Secondary | ICD-10-CM | POA: Diagnosis not present

## 2017-05-09 LAB — COMPREHENSIVE METABOLIC PANEL
ALT: 10 U/L — AB (ref 14–54)
AST: 17 U/L (ref 15–41)
Albumin: 4.1 g/dL (ref 3.5–5.0)
Alkaline Phosphatase: 59 U/L (ref 38–126)
Anion gap: 8 (ref 5–15)
BILIRUBIN TOTAL: 0.7 mg/dL (ref 0.3–1.2)
BUN: 17 mg/dL (ref 6–20)
CALCIUM: 9.2 mg/dL (ref 8.9–10.3)
CO2: 23 mmol/L (ref 22–32)
CREATININE: 0.7 mg/dL (ref 0.44–1.00)
Chloride: 106 mmol/L (ref 101–111)
Glucose, Bld: 89 mg/dL (ref 65–99)
Potassium: 3.5 mmol/L (ref 3.5–5.1)
Sodium: 137 mmol/L (ref 135–145)
TOTAL PROTEIN: 7.8 g/dL (ref 6.5–8.1)

## 2017-05-09 LAB — URINALYSIS, ROUTINE W REFLEX MICROSCOPIC
BILIRUBIN URINE: NEGATIVE
Bacteria, UA: NONE SEEN
GLUCOSE, UA: NEGATIVE mg/dL
KETONES UR: NEGATIVE mg/dL
Nitrite: NEGATIVE
Protein, ur: 30 mg/dL — AB
SPECIFIC GRAVITY, URINE: 1.031 — AB (ref 1.005–1.030)
pH: 5 (ref 5.0–8.0)

## 2017-05-09 LAB — CBC
HCT: 37.5 % (ref 36.0–46.0)
Hemoglobin: 12.6 g/dL (ref 12.0–15.0)
MCH: 29.9 pg (ref 26.0–34.0)
MCHC: 33.6 g/dL (ref 30.0–36.0)
MCV: 88.9 fL (ref 78.0–100.0)
PLATELETS: 311 10*3/uL (ref 150–400)
RBC: 4.22 MIL/uL (ref 3.87–5.11)
RDW: 12.8 % (ref 11.5–15.5)
WBC: 4.3 10*3/uL (ref 4.0–10.5)

## 2017-05-09 LAB — I-STAT BETA HCG BLOOD, ED (MC, WL, AP ONLY): I-stat hCG, quantitative: <5 m[IU]/mL (ref <5)

## 2017-05-09 LAB — DIFFERENTIAL
BASOS ABS: 0 10*3/uL (ref 0.0–0.1)
Basophils Relative: 0 %
Eosinophils Absolute: 0.1 10*3/uL (ref 0.0–0.7)
Eosinophils Relative: 2 %
LYMPHS ABS: 0.7 10*3/uL (ref 0.7–4.0)
Lymphocytes Relative: 17 %
MONOS PCT: 9 %
Monocytes Absolute: 0.4 10*3/uL (ref 0.1–1.0)
Neutro Abs: 3 10*3/uL (ref 1.7–7.7)
Neutrophils Relative %: 72 %

## 2017-05-09 LAB — LIPASE, BLOOD: LIPASE: 22 U/L (ref 11–51)

## 2017-05-09 MED ORDER — ACETAMINOPHEN 500 MG PO TABS
1000.0000 mg | ORAL_TABLET | Freq: Once | ORAL | Status: AC
  Administered 2017-05-09: 1000 mg via ORAL
  Filled 2017-05-09: qty 2

## 2017-05-09 NOTE — ED Notes (Signed)
Pt c/o right upper quad and left quad abd pain, headache, diarrhea, lower back pain, fever that started today,

## 2017-05-09 NOTE — Discharge Instructions (Signed)
Please take tylenol and ibuprofen for pain. Your diarrhea may last for 1-2 weeks.  Please return for worsening symptoms, including persistent fevers, worsening abdominal pain, bloody stools, or any other symptoms concerning to you.

## 2017-05-09 NOTE — ED Provider Notes (Signed)
[facility]-EMER[facility]Y DEPT Provider Note   CSN: [id number] Arrival date & time: 05/09/17  1229     History   Chief Complaint Chief Complaint  Patient presents with  . Abdominal Pain    HPI Sharon Nash is a 21 y.o. female.  HPI    21 year old female who presents with upper abdominal pain and diarrhea. She has no previous abdominal surgeries, and has a history of asthma and reported chronic abdominal pain. States that she has been told that she may have ulcers in the past but states that this is different. For the past 3 days has had right flank and right upper quadrant abdominal pain without other aggravating or alleviating factors. Associated with 7 episodes of watery nonbloody diarrhea this morning. Denies any nausea, vomiting, dysuria, urinary frequency, abnormal vaginal bleeding or discharge.reports having a fever of 102 Fahrenheit this morning for which she took Tylenol.   Past Medical History:  Diagnosis Date  . Asthma   . Chronic abdominal pain   . Chronic knee pain   . Respiratory failure, acute (HCC) 06/2012   bipap only, not intubated, due to asthma exacerbation    Patient Active Problem List   Diagnosis Date Noted  . Gastroesophageal reflux disease without esophagitis 01/27/2017  . Active labor at term 03/02/2016  . Rh negative state in antepartum period 02/26/2016  . 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 Supervision of normal pregnancy 09/13/2015  . Hyperemesis arising during pregnancy 08/30/2015  . Upper gastrointestinal bleeding 08/28/2015  . Status asthmaticus 07/06/2012  . Acute respiratory failure (HCC) 07/06/2012  . Asthma exacerbation 07/05/2012    Past Surgical History:  Procedure Laterality Date  . None      OB History    Gravida Para Term Preterm AB Living   2 2 2  0 0 2   SAB TAB Ectopic Multiple Live Births   0 0 0 0 2       Home Medications    Prior to Admission medications   Medication Sig Start Date End Date Taking? Authorizing  Provider  albuterol (PROVENTIL HFA;VENTOLIN HFA) 108 (90 Base) MCG/ACT inhaler Inhale 2 puffs into the lungs every 4 (four) hours as needed for wheezing or shortness of breath. 06/29/16  Yes Muthersbaugh, Dahlia Client, PA-C  albuterol (PROVENTIL) (2.5 MG/3ML) 0.083% nebulizer solution Take 3 mLs (2.5 mg total) by nebulization every 4 (four) hours as needed for wheezing or shortness of breath. 01/24/17  Yes Campbell Riches, NP  budesonide-formoterol (SYMBICORT) 80-4.5 MCG/ACT inhaler Inhale 2 puffs into the lungs 2 (two) times daily. To prevent wheezing 01/24/17  Yes Campbell Riches, NP  cetirizine (ZYRTEC ALLERGY) 10 MG tablet Take 1 tablet (10 mg total) by mouth daily. 01/24/17  Yes Campbell Riches, NP  fluticasone (FLONASE) 50 MCG/ACT nasal spray Place 2 sprays into both nostrils daily. 01/24/17  Yes Campbell Riches, NP  pantoprazole (PROTONIX) 40 MG tablet Take 40 mg by mouth daily.   Yes [provider]  triamcinolone (NASACORT AQ) 55 MCG/ACT AERO nasal inhaler Place 2 sprays into the nose daily. 06/29/16  Yes Muthersbaugh, Dahlia Client, PA-C  azithromycin (ZITHROMAX Z-PAK) 250 MG tablet Take 2 tablets (500 mg) on  Day 1,  followed by 1 tablet (250 mg) once daily on Days 2 through 5. 01/24/17   Campbell Riches, NP  Guaifenesin 1200 MG TB12 Take 1 tablet (1,200 mg total) by mouth 2 (two) times daily. 01/22/17   Lawyer, Cristal Deer, PA-C  pantoprazole (PROTONIX) 40 MG tablet Take 1  tablet (40 mg total) by mouth daily. Prn acid reflux 01/24/17   Campbell Riches, NP  predniSONE (DELTASONE) 20 MG tablet 3 po qd x 3 d then 2 po qd x 3 d then 1 po qd x 2 d 01/24/17   Campbell Riches, NP    Family History Family History  Problem Relation Age of Onset  . Asthma Sister   . Diabetes Maternal Grandmother   . Hypertension Maternal Grandmother   . Diabetes Maternal Grandfather   . Asthma Maternal Grandfather   . Breast cancer Maternal Aunt   . Colon cancer Neg Hx     Social History Social  History  Substance Use Topics  . Smoking status: Never Smoker  . Smokeless tobacco: Never Used  . Alcohol use No     Allergies   Shrimp [shellfish allergy]   Review of Systems Review of Systems  Constitutional: Positive for fever.  Respiratory: Negative for cough and shortness of breath.   Cardiovascular: Negative for chest pain.  Gastrointestinal: Positive for abdominal pain and diarrhea. Negative for nausea and vomiting.  Genitourinary: Positive for flank pain. Negative for dysuria, frequency, pelvic pain and urgency.  Allergic/Immunologic: Negative for immunocompromised state.  Neurological: Positive for headaches.  Hematological: Does not bruise/bleed easily.  All other systems reviewed and are negative.    Physical Exam Updated Vital Signs BP 115/67 (BP Location: Left Arm)   Pulse 76   Temp 99.2 F (37.3 C) (Oral)   Resp 16   Ht 5\' 8"  (1.727 m)   Wt 74.8 kg (165 lb)   LMP 05/08/2017   SpO2 99%   BMI 25.09 kg/m   Physical Exam Physical Exam  Nursing note and vitals reviewed. Constitutional: Well developed, well nourished, non-toxic, and in no acute distress Head: Normocephalic and atraumatic.  Mouth/Throat: Oropharynx is clear and moist.  Neck: Normal range of motion. Neck supple.  Cardiovascular: Normal rate and regular rhythm.   Pulmonary/Chest: Effort normal and breath sounds normal.  Abdominal: Soft. There is RUQ, epigastric, and right CVA tenderness. No tenderness at McBurney's point. There is no rebound and no guarding.  Musculoskeletal: Normal range of motion.  Neurological: Alert, no facial droop, fluent speech, moves all extremities symmetrically Skin: Skin is warm and dry.  Psychiatric: Cooperative   ED Treatments / Results  Labs (all labs ordered are listed, but only abnormal results are displayed) Labs Reviewed  COMPREHENSIVE METABOLIC PANEL - Abnormal; Notable for the following:       Result Value   ALT 10 (*)    All other components  within normal limits  URINALYSIS, ROUTINE W REFLEX MICROSCOPIC - Abnormal; Notable for the following:    APPearance CLOUDY (*)    Specific Gravity, Urine 1.031 (*)    Hgb urine dipstick LARGE (*)    Protein, ur 30 (*)    Leukocytes, UA MODERATE (*)    Squamous Epithelial / LPF TOO NUMEROUS TO COUNT (*)    All other components within normal limits  URINE CULTURE  LIPASE, BLOOD  CBC  DIFFERENTIAL  I-STAT BETA HCG BLOOD, ED ([facility], [facility], [facility] ONLY)    EKG  EKG Interpretation None       Radiology US Renal  Result Date: 05/09/2017 CLINICAL DATA:  Right upper quadrant/right flank pain. EXAM: RENAL / URINARY TRACT ULTRASOUND COMPLETE COMPARISON:  Ultrasound 11/01/2015. FINDINGS: Right Kidney: Length: 11.0 cm. Echogenicity within normal limits. No mass or hydronephrosis visualized. Left Kidney: Length: 10.6 cm. Echogenicity within normal limits. No mass  or hydronephrosis visualized. Bladder: Appears normal for degree of bladder distention. IMPRESSION: No acute or focal abnormality identified. Electronically Signed   By: Maisie Fushomas  Register   On: 05/09/2017 14:54   [place] Abdomen Limited Ruq  Result Date: 05/09/2017 CLINICAL DATA:  Right upper quadrant pain. EXAM: ULTRASOUND ABDOMEN LIMITED RIGHT UPPER QUADRANT COMPARISON:  None. FINDINGS: Gallbladder: No gallstones or wall thickening visualized. No sonographic Murphy sign noted by sonographer. Common bile duct: Diameter: 2.2 mm Liver: No focal lesion identified. Within normal limits in parenchymal echogenicity. Portal vein is patent on color Doppler imaging with normal direction of blood flow towards the liver. IMPRESSION: Normal right upper quadrant ultrasound. Electronically Signed   By: Elige KoHetal  Patel   On: 05/09/2017 15:10    Procedures Procedures (including critical care time)  Medications Ordered in ED Medications  acetaminophen (TYLENOL) tablet 1,000 mg (1,000 mg Oral Given 05/09/17 1400)     Initial Impression / Assessment and Plan / ED  Course  I have reviewed the triage vital signs and the nursing notes.  Pertinent labs & imaging results that were available during my care of the patient were reviewed by me and considered in my medical decision making (see chart for details).     Presents with abdominal pain and diarrhea. Well appearing and in no acute distress. Abdomen soft and non-peritoneal. With right CVA and RUQ/epigastric tenderness to palpation. No tenderness at McBurney's point to suspect appendicitis at this time. UA with multiple squamous cells and blood (but just ending menstruation). It is not convincing for UTI at this time, especially given lack of urinary symptoms. Renal ultrasound without evidence of hydronephrosis. RUQ ultrasound without evidence of hepatobiliary disease. Reassuring blood work. Given benign exam, I do not think she requires CT scan at this time. Will have her continue supportive care for diarrhea and return for re-evaluation for worsening symptoms. Strict return and follow-up instructions reviewed. She expressed understanding of all discharge instructions and felt comfortable with the plan of care.     Final Clinical Impressions(s) / ED Diagnoses   Final diagnoses:  RUQ pain    New Prescriptions New Prescriptions   No medications on file     Lavera GuiseLiu, Weston Kallman Duo, MD 05/09/17 1526

## 2017-05-09 NOTE — ED Triage Notes (Signed)
Pt reports that's she woke up with abdominal with episodes of diarrhea. Pt reports back pain and headache

## 2017-05-09 NOTE — ED Notes (Signed)
Awaiting US results.

## 2017-05-09 NOTE — ED Notes (Signed)
Pt requesting something for headache,

## 2017-05-11 LAB — URINE CULTURE

## 2017-06-10 ENCOUNTER — Other Ambulatory Visit: Payer: Self-pay | Admitting: *Deleted

## 2017-06-14 ENCOUNTER — Emergency Department (HOSPITAL_COMMUNITY)
Admission: EM | Admit: 2017-06-14 | Discharge: 2017-06-15 | Disposition: A | Discharge status: Home / Self Care | Payer: Medicaid Other | Attending: Emergency Medicine | Admitting: Emergency Medicine

## 2017-06-14 ENCOUNTER — Emergency Department (HOSPITAL_COMMUNITY): Payer: Medicaid Other

## 2017-06-14 ENCOUNTER — Encounter (HOSPITAL_COMMUNITY): Payer: Self-pay | Admitting: *Deleted

## 2017-06-14 DIAGNOSIS — J45909 Unspecified asthma, uncomplicated: Secondary | ICD-10-CM | POA: Diagnosis not present

## 2017-06-14 DIAGNOSIS — A599 Trichomoniasis, unspecified: Secondary | ICD-10-CM | POA: Diagnosis not present

## 2017-06-14 DIAGNOSIS — J4521 Mild intermittent asthma with (acute) exacerbation: Secondary | ICD-10-CM | POA: Insufficient documentation

## 2017-06-14 DIAGNOSIS — N739 Female pelvic inflammatory disease, unspecified: Secondary | ICD-10-CM | POA: Diagnosis not present

## 2017-06-14 DIAGNOSIS — J4541 Moderate persistent asthma with (acute) exacerbation: Secondary | ICD-10-CM | POA: Diagnosis not present

## 2017-06-14 DIAGNOSIS — N760 Acute vaginitis: Secondary | ICD-10-CM | POA: Diagnosis not present

## 2017-06-14 DIAGNOSIS — E876 Hypokalemia: Secondary | ICD-10-CM

## 2017-06-14 DIAGNOSIS — Z79899 Other long term (current) drug therapy: Secondary | ICD-10-CM | POA: Diagnosis not present

## 2017-06-14 DIAGNOSIS — B9689 Other specified bacterial agents as the cause of diseases classified elsewhere: Secondary | ICD-10-CM | POA: Insufficient documentation

## 2017-06-14 DIAGNOSIS — R05 Cough: Secondary | ICD-10-CM | POA: Diagnosis present

## 2017-06-14 MED ORDER — PREDNISONE 20 MG PO TABS
40.0000 mg | ORAL_TABLET | Freq: Once | ORAL | Status: AC
  Administered 2017-06-14: 40 mg via ORAL
  Filled 2017-06-14: qty 2

## 2017-06-14 MED ORDER — ALBUTEROL (5 MG/ML) CONTINUOUS INHALATION SOLN
10.0000 mg/h | INHALATION_SOLUTION | RESPIRATORY_TRACT | Status: AC
  Administered 2017-06-14: 10 mg/h via RESPIRATORY_TRACT
  Filled 2017-06-14: qty 20

## 2017-06-14 MED ORDER — IPRATROPIUM BROMIDE 0.02 % IN SOLN
0.5000 mg | RESPIRATORY_TRACT | Status: AC
  Administered 2017-06-14: 0.5 mg via RESPIRATORY_TRACT
  Filled 2017-06-14: qty 2.5

## 2017-06-14 NOTE — ED Provider Notes (Signed)
[facility]-EMER[facility]ENCY DEPT Provider Note   CSN: [id number] Arrival date & time: 06/14/17  2200     History   Chief Complaint Chief Complaint  Patient presents with  . Cough    HPI Sharon Nash is a 21 y.o. female.  HPI  The patient is a 21 year old female with a history of asthma who has had recurrent respiratory failure requiring BiPAP but has not required intubation. She last had prednisone approximately one month ago. She states that she has had 2 days of progressive symptoms that started with runny nose and a sore throat that is now become more coughing wheezing and shortness of breath. She has used her albuterol nebulizer and inhaler today with minimal improvement. Symptoms are persistent, gradually worsening, not associated with fevers. She does have a heaviness on her chest with wheezing. No swelling of the legs.  Past Medical History:  Diagnosis Date  . Asthma   . Chronic abdominal pain   . Chronic knee pain   . Respiratory failure, acute (HCC) 06/2012   bipap only, not intubated, due to asthma exacerbation    Patient Active Problem List   Diagnosis Date Noted  . Gastroesophageal reflux disease without esophagitis 01/27/2017  . Active labor at term 03/02/2016  . Rh negative state in antepartum period 02/26/2016  . 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 Supervision of normal pregnancy 09/13/2015  . Hyperemesis arising during pregnancy 08/30/2015  . Upper gastrointestinal bleeding 08/28/2015  . Status asthmaticus 07/06/2012  . Acute respiratory failure (HCC) 07/06/2012  . Asthma exacerbation 07/05/2012    Past Surgical History:  Procedure Laterality Date  . None      OB History    Gravida Para Term Preterm AB Living   0 0 2   SAB TAB Ectopic Multiple Live Births   0 0 0 0 2       Home Medications    Prior to Admission medications   Medication Sig Start Date End Date Taking? Authorizing Provider  albuterol (PROVENTIL HFA;VENTOLIN HFA) 108 (90  Base) MCG/ACT inhaler Inhale 2 puffs into the lungs every 4 (four) hours as needed for wheezing or shortness of breath. 06/29/16   Muthersbaugh, Dahlia Client, PA-C  albuterol (PROVENTIL) (2.5 MG/3ML) 0.083% nebulizer solution Take 3 mLs (2.5 mg total) by nebulization every 4 (four) hours as needed for wheezing or shortness of breath. 01/24/17   Campbell Riches, NP  azithromycin (ZITHROMAX Z-PAK) 250 MG tablet Take 2 tablets (500 mg) on  Day 1,  followed by 1 tablet (250 mg) once daily on Days 2 through 5. 01/24/17   Campbell Riches, NP  budesonide-formoterol (SYMBICORT) 80-4.5 MCG/ACT inhaler Inhale 2 puffs into the lungs 2 (two) times daily. To prevent wheezing 01/24/17   Campbell Riches, NP  cetirizine (ZYRTEC ALLERGY) 10 MG tablet Take 1 tablet (10 mg total) by mouth daily. 01/24/17   Campbell Riches, NP  fluticasone (FLONASE) 50 MCG/ACT nasal spray Place 2 sprays into both nostrils daily. 01/24/17   Campbell Riches, NP  Guaifenesin 1200 MG TB12 Take 1 tablet (1,200 mg total) by mouth 2 (two) times daily. 01/22/17   Lawyer, Cristal Deer, PA-C  pantoprazole (PROTONIX) 40 MG tablet Take 1 tablet (40 mg total) by mouth daily. Prn acid reflux 01/24/17   Campbell Riches, NP  pantoprazole (PROTONIX) 40 MG tablet Take 40 mg by mouth daily.    [provider]  predniSONE (DELTASONE) 20 MG tablet 3 po qd x 3 d then  2 po qd x 3 d then 1 po qd x 2 d 01/24/17   Campbell Riches, NP  triamcinolone (NASACORT AQ) 55 MCG/ACT AERO nasal inhaler Place 2 sprays into the nose daily. 06/29/16   Muthersbaugh, Dahlia Client, PA-C    Family History Family History  Problem Relation Age of Onset  . Asthma Sister   . Diabetes Maternal Grandmother   . Hypertension Maternal Grandmother   . Diabetes Maternal Grandfather   . Asthma Maternal Grandfather   . Breast cancer Maternal Aunt   . Colon cancer Neg Hx     Social History Social History  Substance Use Topics  . Smoking status: Never Smoker  . Smokeless  tobacco: Never Used  . Alcohol use No     Allergies   Shrimp [shellfish allergy]   Review of Systems Review of Systems  All other systems reviewed and are negative.    Physical Exam Updated Vital Signs BP 102/64   Pulse (!) 114   Temp 98.8 F (37.1 C) (Oral)   Resp 20   Ht  (1.727 m)   Wt 69.4 kg (153 lb)   LMP 06/08/2017   SpO2 97%   BMI 23.26 kg/m   Physical Exam  Constitutional: She appears well-developed and well-nourished. No distress.  HENT:  Head: Normocephalic and atraumatic.  Mouth/Throat: Oropharynx is clear and moist. No oropharyngeal exudate.  Eyes: Pupils are equal, round, and reactive to light. Conjunctivae and EOM are normal. Right eye exhibits no discharge. Left eye exhibits no discharge. No scleral icterus.  Neck: Normal range of motion. Neck supple. No JVD present. No thyromegaly present.  Cardiovascular: Normal rate, regular rhythm, normal heart sounds and intact distal pulses.  Exam reveals no gallop and no friction rub.   No murmur heard. Pulmonary/Chest: Effort normal. No respiratory distress. She has wheezes. She has no rales.  Abdominal: Soft. Bowel sounds are normal. She exhibits no distension and no mass. There is no tenderness.  Musculoskeletal: Normal range of motion. She exhibits no edema or tenderness.  Lymphadenopathy:    She has no cervical adenopathy.  Neurological: She is alert. Coordination normal.  Skin: Skin is warm and dry. No rash noted. No erythema.  Psychiatric: She has a normal mood and affect. Her behavior is normal.  Nursing note and vitals reviewed.    ED Treatments / Results  Labs (all labs ordered are listed, but only abnormal results are displayed) Labs Reviewed  WET PREP, GENITAL - Abnormal; Notable for the following:       Result Value   Trich, Wet Prep PRESENT (*)    Clue Cells Wet Prep HPF POC PRESENT (*)    WBC, Wet Prep HPF POC FEW (*)    All other components within normal limits  COMPREHENSIVE  METABOLIC PANEL - Abnormal; Notable for the following:    Potassium 2.8 (*)    Glucose, Bld 184 (*)    Calcium 8.8 (*)    ALT 13 (*)    All other components within normal limits  CBC WITH DIFFERENTIAL/PLATELET - Abnormal; Notable for the following:    Hemoglobin 11.4 (*)    HCT 35.0 (*)    Lymphs Abs 0.3 (*)    Monocytes Absolute 0.0 (*)    All other components within normal limits  URINALYSIS, ROUTINE W REFLEX MICROSCOPIC - Abnormal; Notable for the following:    APPearance CLOUDY (*)    Ketones, ur 20 (*)    Protein, ur 30 (*)    Leukocytes,  UA MODERATE (*)    Bacteria, UA RARE (*)    Squamous Epithelial / LPF 6-30 (*)    All other components within normal limits  LIPASE, BLOOD  RPR  HIV ANTIBODY (ROUTINE TESTING)  I-STAT BETA HCG BLOOD, ED ([facility], [facility], [facility] ONLY)  GC/CHLAMYDIA PROBE AMP ([place]) NOT AT [facility]      Radiology Dg Chest 2 View  Result Date: 06/14/2017 CLINICAL DATA:  Cough and congestion with headache and dyspnea. EXAM: CHEST  2 VIEW COMPARISON:  06/29/2016 FINDINGS: The heart size and mediastinal contours are within normal limits. Both lungs are clear. The visualized skeletal structures are unremarkable. IMPRESSION: No active cardiopulmonary disease. Electronically Signed   By: Tollie Eth M.D.   On: 06/14/2017 23:17   Ct Abdomen Pelvis W Contrast  Result Date: 06/15/2017 CLINICAL DATA:  Cough, congestion, wheezing, headache, shortness of breath, and chest pain. Epigastric abdominal pain. EXAM: CT ABDOMEN AND PELVIS WITH CONTRAST TECHNIQUE: Multidetector CT imaging of the abdomen and pelvis was performed using the standard protocol following bolus administration of intravenous contrast. CONTRAST:  ISOVUE-300 IOPAMIDOL (ISOVUE-300) INJECTION 61% COMPARISON:  None. FINDINGS: Lower chest: The lung bases are clear. Hepatobiliary: No focal liver abnormality is seen. No gallstones, gallbladder wall thickening, or biliary dilatation. Pancreas: Unremarkable. No  pancreatic ductal dilatation or surrounding inflammatory changes. Spleen: Normal in size without focal abnormality. Adrenals/Urinary Tract: Adrenal glands are unremarkable. Kidneys are normal, without renal calculi, focal lesion, or hydronephrosis. Bladder wall is thickened which may indicate cystitis. Correlation with urinalysis recommended. Stomach/Bowel: Stomach, small bowel, and colon are not abnormally distended. Scattered stool throughout the colon. No bowel or colonic wall thickening is appreciated. Appendix is normal. Vascular/Lymphatic: No significant vascular findings are present. No enlarged abdominal or pelvic lymph nodes. Reproductive: Uterus and bilateral adnexa are unremarkable. Other: No abdominal wall hernia or abnormality. No abdominopelvic ascites. Musculoskeletal: No acute or significant osseous findings. IMPRESSION: No acute process demonstrated in the abdomen or pelvis. No evidence of bowel obstruction or inflammation. Electronically Signed   By: Burman Nieves M.D.   On: 06/15/2017 04:56    Procedures Procedures (including critical care time)  Medications Ordered in ED Medications  albuterol (PROVENTIL,VENTOLIN) solution continuous neb (0 mg/hr Nebulization Stopped 06/15/17 0016)  cefTRIAXone (ROCEPHIN) 1 g in dextrose 5 % 50 mL IVPB (1 g Intravenous New Bag/Given 06/15/17 0656)  ipratropium (ATROVENT) nebulizer solution 0.5 mg (0.5 mg Nebulization Given 06/14/17 2316)  predniSONE (DELTASONE) tablet 40 mg (40 mg Oral Given 06/14/17 2244)  dicyclomine (BENTYL) injection 20 mg (20 mg Intramuscular Given 06/15/17 0245)  albuterol (PROVENTIL) (2.5 MG/3ML) 0.083% nebulizer solution 5 mg (5 mg Nebulization Given 06/15/17 0153)  ipratropium (ATROVENT) nebulizer solution 0.5 mg (0.5 mg Nebulization Given 06/15/17 0153)  sodium chloride 0.9 % bolus 1,000 mL (0 mLs Intravenous Stopped 06/15/17 0535)  metoCLOPramide (REGLAN) injection 10 mg (10 mg Intravenous Given 06/15/17 0409)    diphenhydrAMINE (BENADRYL) injection 25 mg (25 mg Intravenous Given 06/15/17 0410)  albuterol (PROVENTIL) (2.5 MG/3ML) 0.083% nebulizer solution 5 mg (5 mg Nebulization Given 06/15/17 0351)  ipratropium (ATROVENT) nebulizer solution 0.5 mg (0.5 mg Nebulization Given 06/15/17 0351)  iopamidol (ISOVUE-300) 61 % injection 100 mL (100 mLs Intravenous Contrast Given 06/15/17 0439)  potassium chloride SA (K-DUR,KLOR-CON) CR tablet 40 mEq (40 mEq Oral Given 06/15/17 9147)     Initial Impression / Assessment and Plan / ED Course  I have reviewed the triage vital signs and the nursing notes.  Pertinent labs & imaging  results that were available during my care of the patient were reviewed by me and considered in my medical decision making (see chart for details).    The patient does have increased wheezing, prolonged expiratory phase, she is not using accessory muscles and does not appear to be significantly tachypneic. Oxygen saturations are 98% on room air. She is not tachycardic or febrile. Nebulizer treatment, continuous, prednisone, reevaluate. \ At change of shift - care signed out to Dr. Lynelle Doctor to follow up chest xray results and reexamine patient after nebulizer treatment and disposition accordingly  Final Clinical Impressions(s) / ED Diagnoses   Final diagnoses:  Hypokalemia  Mild intermittent asthma with exacerbation  PID (pelvic inflammatory disease)    New Prescriptions New Prescriptions   No medications on file     Eber Hong, MD 06/15/17 [phone]

## 2017-06-14 NOTE — ED Notes (Signed)
Mother upset because the babies daddy took her child. Pt specifically gave this nurse in triage permission to speak with the babies daddy and tell him that the pt was in no shape to take care of a child in her condition. Father was then asked in front of the pt with no complaints that this RN requested the father to come pick up the child. This RN reminded her of the conversation  this RN had in triage about the babies father to come get the child. Pt upset because he left the pt's predenisone and other cough medicine. The pt  was upset because the father wasn't answering the phone. Shortly after that she was heard cursing the father out.

## 2017-06-14 NOTE — ED Notes (Signed)
Patient transported to X-ray 

## 2017-06-14 NOTE — ED Triage Notes (Signed)
Pt reports cough congestion, headache, sob, chest pain when coughing or taking a deep breath.

## 2017-06-14 NOTE — ED Notes (Signed)
Pt receiving continuous neb at this time  

## 2017-06-15 ENCOUNTER — Emergency Department (HOSPITAL_COMMUNITY)
Admission: EM | Admit: 2017-06-15 | Discharge: 2017-06-16 | Disposition: A | Discharge status: Home / Self Care | Payer: Medicaid Other | Source: Home / Self Care | Attending: Emergency Medicine | Admitting: Emergency Medicine

## 2017-06-15 ENCOUNTER — Encounter (HOSPITAL_COMMUNITY): Payer: Self-pay | Admitting: *Deleted

## 2017-06-15 ENCOUNTER — Emergency Department (HOSPITAL_COMMUNITY): Payer: Medicaid Other

## 2017-06-15 DIAGNOSIS — J4541 Moderate persistent asthma with (acute) exacerbation: Secondary | ICD-10-CM

## 2017-06-15 LAB — COMPREHENSIVE METABOLIC PANEL
ALBUMIN: 3.9 g/dL (ref 3.5–5.0)
ALK PHOS: 65 U/L (ref 38–126)
ALT: 13 U/L — ABNORMAL LOW (ref 14–54)
ANION GAP: 11 (ref 5–15)
AST: 22 U/L (ref 15–41)
BUN: 12 mg/dL (ref 6–20)
CALCIUM: 8.8 mg/dL — AB (ref 8.9–10.3)
CHLORIDE: 107 mmol/L (ref 101–111)
CO2: 22 mmol/L (ref 22–32)
Creatinine, Ser: 0.75 mg/dL (ref 0.44–1.00)
GFR calc non Af Amer: >60 mL/min (ref >60)
GLUCOSE: 184 mg/dL — AB (ref 65–99)
POTASSIUM: 2.8 mmol/L — AB (ref 3.5–5.1)
SODIUM: 140 mmol/L (ref 135–145)
Total Bilirubin: 0.4 mg/dL (ref 0.3–1.2)
Total Protein: 7.4 g/dL (ref 6.5–8.1)

## 2017-06-15 LAB — URINALYSIS, ROUTINE W REFLEX MICROSCOPIC
Bilirubin Urine: NEGATIVE
GLUCOSE, UA: NEGATIVE mg/dL
HGB URINE DIPSTICK: NEGATIVE
Ketones, ur: 20 mg/dL — AB
NITRITE: NEGATIVE
Protein, ur: 30 mg/dL — AB
SPECIFIC GRAVITY, URINE: 1.027 (ref 1.005–1.030)
pH: 7 (ref 5.0–8.0)

## 2017-06-15 LAB — WET PREP, GENITAL
SPERM: NONE SEEN
YEAST WET PREP: NONE SEEN

## 2017-06-15 LAB — CBC WITH DIFFERENTIAL/PLATELET
BASOS ABS: 0 10*3/uL (ref 0.0–0.1)
BASOS PCT: 0 %
EOS PCT: 0 %
Eosinophils Absolute: 0 10*3/uL (ref 0.0–0.7)
HCT: 35 % — ABNORMAL LOW (ref 36.0–46.0)
Hemoglobin: 11.4 g/dL — ABNORMAL LOW (ref 12.0–15.0)
LYMPHS PCT: 6 %
Lymphs Abs: 0.3 10*3/uL — ABNORMAL LOW (ref 0.7–4.0)
MCH: 29.1 pg (ref 26.0–34.0)
MCHC: 32.6 g/dL (ref 30.0–36.0)
MCV: 89.3 fL (ref 78.0–100.0)
Monocytes Absolute: 0 10*3/uL — ABNORMAL LOW (ref 0.1–1.0)
Monocytes Relative: 1 %
Neutro Abs: 5.2 10*3/uL (ref 1.7–7.7)
Neutrophils Relative %: 93 %
PLATELETS: 284 10*3/uL (ref 150–400)
RBC: 3.92 MIL/uL (ref 3.87–5.11)
RDW: 12.9 % (ref 11.5–15.5)
WBC: 5.5 10*3/uL (ref 4.0–10.5)

## 2017-06-15 LAB — I-STAT BETA HCG BLOOD, ED (MC, WL, AP ONLY)

## 2017-06-15 LAB — LIPASE, BLOOD: LIPASE: 20 U/L (ref 11–51)

## 2017-06-15 MED ORDER — NAPROXEN 500 MG PO TABS: ORAL_TABLET | ORAL | 0 refills | Status: DC

## 2017-06-15 MED ORDER — IPRATROPIUM BROMIDE 0.02 % IN SOLN
0.5000 mg | Freq: Once | RESPIRATORY_TRACT | Status: AC
  Administered 2017-06-15: 0.5 mg via RESPIRATORY_TRACT
  Filled 2017-06-15: qty 2.5

## 2017-06-15 MED ORDER — SODIUM CHLORIDE 0.9 % IV BOLUS (SEPSIS)
1000.0000 mL | Freq: Once | INTRAVENOUS | Status: AC
  Administered 2017-06-15: 1000 mL via INTRAVENOUS

## 2017-06-15 MED ORDER — ALBUTEROL SULFATE (2.5 MG/3ML) 0.083% IN NEBU
INHALATION_SOLUTION | RESPIRATORY_TRACT | Status: AC
  Administered 2017-06-15: 2.5 mg
  Filled 2017-06-15: qty 3

## 2017-06-15 MED ORDER — IPRATROPIUM-ALBUTEROL 0.5-2.5 (3) MG/3ML IN SOLN
3.0000 mL | Freq: Once | RESPIRATORY_TRACT | Status: AC
  Administered 2017-06-15: 3 mL via RESPIRATORY_TRACT

## 2017-06-15 MED ORDER — METOCLOPRAMIDE HCL 5 MG/ML IJ SOLN
10.0000 mg | Freq: Once | INTRAMUSCULAR | Status: AC
  Administered 2017-06-15: 10 mg via INTRAVENOUS
  Filled 2017-06-15: qty 2

## 2017-06-15 MED ORDER — DEXTROSE 5 % IV SOLN
1.0000 g | Freq: Once | INTRAVENOUS | Status: AC
  Administered 2017-06-15: 1 g via INTRAVENOUS
  Filled 2017-06-15: qty 10

## 2017-06-15 MED ORDER — DIPHENHYDRAMINE HCL 50 MG/ML IJ SOLN
25.0000 mg | Freq: Once | INTRAMUSCULAR | Status: AC
  Administered 2017-06-15: 25 mg via INTRAVENOUS
  Filled 2017-06-15: qty 1

## 2017-06-15 MED ORDER — POTASSIUM CHLORIDE ER 20 MEQ PO TBCR: 20.0000 meq | EXTENDED_RELEASE_TABLET | Freq: Two times a day (BID) | ORAL | 0 refills | Status: DC

## 2017-06-15 MED ORDER — ALBUTEROL SULFATE (2.5 MG/3ML) 0.083% IN NEBU
5.0000 mg | INHALATION_SOLUTION | Freq: Once | RESPIRATORY_TRACT | Status: AC
  Administered 2017-06-15: 5 mg via RESPIRATORY_TRACT
  Filled 2017-06-15: qty 6

## 2017-06-15 MED ORDER — IPRATROPIUM-ALBUTEROL 0.5-2.5 (3) MG/3ML IN SOLN
3.0000 mL | Freq: Once | RESPIRATORY_TRACT | Status: AC
  Administered 2017-06-15: 3 mL via RESPIRATORY_TRACT
  Filled 2017-06-15: qty 3

## 2017-06-15 MED ORDER — POTASSIUM CHLORIDE CRYS ER 20 MEQ PO TBCR
40.0000 meq | EXTENDED_RELEASE_TABLET | Freq: Once | ORAL | Status: AC
  Administered 2017-06-15: 40 meq via ORAL
  Filled 2017-06-15: qty 2

## 2017-06-15 MED ORDER — ALBUTEROL SULFATE HFA 108 (90 BASE) MCG/ACT IN AERS: 2.0000 | INHALATION_SPRAY | RESPIRATORY_TRACT | 0 refills | Status: DC | PRN

## 2017-06-15 MED ORDER — IOPAMIDOL (ISOVUE-300) INJECTION 61%
100.0000 mL | Freq: Once | INTRAVENOUS | Status: AC | PRN
  Administered 2017-06-15: 100 mL via INTRAVENOUS

## 2017-06-15 MED ORDER — ACETAMINOPHEN 325 MG PO TABS
650.0000 mg | ORAL_TABLET | Freq: Once | ORAL | Status: AC
  Administered 2017-06-15: 650 mg via ORAL
  Filled 2017-06-15: qty 2

## 2017-06-15 MED ORDER — METRONIDAZOLE 500 MG PO TABS: 500.0000 mg | ORAL_TABLET | Freq: Two times a day (BID) | ORAL | 0 refills | Status: DC

## 2017-06-15 MED ORDER — IPRATROPIUM BROMIDE 0.02 % IN SOLN: 0.5000 mg | Freq: Once | RESPIRATORY_TRACT | Status: DC

## 2017-06-15 MED ORDER — ONDANSETRON 8 MG PO TBDP
8.0000 mg | ORAL_TABLET | Freq: Once | ORAL | Status: AC
  Administered 2017-06-16: 8 mg via ORAL
  Filled 2017-06-15: qty 1

## 2017-06-15 MED ORDER — PREDNISONE 20 MG PO TABS: ORAL_TABLET | ORAL | 0 refills | Status: DC

## 2017-06-15 MED ORDER — ALBUTEROL SULFATE (2.5 MG/3ML) 0.083% IN NEBU: 2.5000 mg | INHALATION_SOLUTION | Freq: Once | RESPIRATORY_TRACT | Status: DC

## 2017-06-15 MED ORDER — ALBUTEROL SULFATE (2.5 MG/3ML) 0.083% IN NEBU
5.0000 mg | INHALATION_SOLUTION | Freq: Once | RESPIRATORY_TRACT | Status: AC
  Administered 2017-06-16: 5 mg via RESPIRATORY_TRACT
  Filled 2017-06-15: qty 6

## 2017-06-15 MED ORDER — ALBUTEROL SULFATE (2.5 MG/3ML) 0.083% IN NEBU
5.0000 mg | INHALATION_SOLUTION | Freq: Once | RESPIRATORY_TRACT | Status: AC
  Administered 2017-06-15: 5 mg via RESPIRATORY_TRACT
  Filled 2017-06-15 (×2): qty 6

## 2017-06-15 MED ORDER — DICYCLOMINE HCL 10 MG/ML IM SOLN
20.0000 mg | Freq: Once | INTRAMUSCULAR | Status: AC
  Administered 2017-06-15: 20 mg via INTRAMUSCULAR
  Filled 2017-06-15: qty 2

## 2017-06-15 MED ORDER — ALBUTEROL SULFATE (2.5 MG/3ML) 0.083% IN NEBU: 5.0000 mg | INHALATION_SOLUTION | Freq: Once | RESPIRATORY_TRACT | Status: DC

## 2017-06-15 MED ORDER — DOXYCYCLINE HYCLATE 100 MG PO CAPS: 100.0000 mg | ORAL_CAPSULE | Freq: Two times a day (BID) | ORAL | 0 refills | Status: DC

## 2017-06-15 MED ORDER — IPRATROPIUM-ALBUTEROL 0.5-2.5 (3) MG/3ML IN SOLN
RESPIRATORY_TRACT | Status: AC
  Administered 2017-06-15: 3 mL via RESPIRATORY_TRACT
  Filled 2017-06-15: qty 3

## 2017-06-15 NOTE — ED Triage Notes (Signed)
Pt returns to er tonight with c/o sob and unable to keep her medications down due to vomiting,

## 2017-06-15 NOTE — Discharge Instructions (Signed)
Take the medications until gone. Use your inhalers as needed for wheezing and shortness of breath. Recheck if you get a high fever, vomiting or your breathing gets worse instead of better.

## 2017-06-15 NOTE — ED Provider Notes (Signed)
1:20 AM patient was left at change of shift to reexamine after her nebulizer treatment. Patient states her breathing is better, however she feels like her chest is still tight and she feels like she needs another treatment. When I listen to her she has good air movement and there is no wheezing. She also states she started getting abdominal pain which is in her lower abdomen shortly after she finished her nebulizer treatment. She states she has been having this pain off and on for the past 3-4 months, she states she gets it 4-5 times a week. She describes the pain as sharp. She states she's been to the ED before about this pain. Looking at her records on August 31 she had a abdominal right upper quadrant ultrasound done that was normal, and ultrasound of her kidneys that was normal. Interestingly look up that record she was having upper abdominal pain and diarrhea. She was given an additional nebulizer and given Bentyl IM for pain.  Recheck at 3:20 AM patient has had her other nebulizer, when I listen to her she again is clear. She states she feels like she needs another treatment. She still continues to complain of abdominal pain. She states it's around her midsection. CT of the abdomen pelvis was ordered. She also complains of a "migraine headache". She was given migraine cocktail.  Recheck at 5:50 AM patient states her breathing is better. Her lungs are again clear. We discussed her CT results. Patient was repaired to have a pelvic exam done. She denies any new sexual contacts.  6:30 AM pelvic exam has been done. She has normal external genitalia, she has some white discharge in the vault, her uterus is normal size and she has tenderness to manipulation of the cervix in the outpatient of the uterus, her adnexa are bilaterally tender to palpation without obvious masses. Patient will be treated for STD. She desires to have RPR and HIV done. Patient does not appear to be in any respiratory distress at this  time.   Results for orders placed or performed during the hospital encounter of 06/14/17  Wet prep, genital  Result Value Ref Range   Yeast Wet Prep HPF POC NONE SEEN NONE SEEN   Trich, Wet Prep PRESENT (A) NONE SEEN   Clue Cells Wet Prep HPF POC PRESENT (A) NONE SEEN   WBC, Wet Prep HPF POC FEW (A) NONE SEEN   Sperm NONE SEEN   Comprehensive metabolic panel  Result Value Ref Range   Sodium 140 135 - 145 mmol/L   Potassium 2.8 (L) 3.5 - 5.1 mmol/L   Chloride 107 101 - 111 mmol/L   CO2 22 22 - 32 mmol/L   Glucose, Bld 184 (H) 65 - 99 mg/dL   BUN 12 6 - 20 mg/dL   Creatinine, Ser 1.61 0.44 - 1.00 mg/dL   Calcium 8.8 (L) 8.9 - 10.3 mg/dL   Total Protein 7.4 6.5 - 8.1 g/dL   Albumin 3.9 3.5 - 5.0 g/dL   AST 22 15 - 41 U/L   ALT 13 (L) 14 - 54 U/L   Alkaline Phosphatase 65 38 - 126 U/L   Total Bilirubin 0.4 0.3 - 1.2 mg/dL   GFR calc non Af Amer >60 >60 mL/min   GFR calc Af Amer >60 >60 mL/min   Anion gap 11 5 - 15  CBC with Differential  Result Value Ref Range   WBC 5.5 4.0 - 10.5 K/uL   RBC 3.92 3.87 - 5.11 MIL/uL  Hemoglobin 11.4 (L) 12.0 - 15.0 g/dL   HCT 16.1 (L) 09.6 - 04.5 %   MCV 89.3 78.0 - 100.0 fL   MCH 29.1 26.0 - 34.0 pg   MCHC 32.6 30.0 - 36.0 g/dL   RDW 40.9 81.1 - 91.4 %   Platelets 284 150 - 400 K/uL   Neutrophils Relative % 93 %   Neutro Abs 5.2 1.7 - 7.7 K/uL   Lymphocytes Relative 6 %   Lymphs Abs 0.3 (L) 0.7 - 4.0 K/uL   Monocytes Relative 1 %   Monocytes Absolute 0.0 (L) 0.1 - 1.0 K/uL   Eosinophils Relative 0 %   Eosinophils Absolute 0.0 0.0 - 0.7 K/uL   Basophils Relative 0 %   Basophils Absolute 0.0 0.0 - 0.1 K/uL  Lipase, blood  Result Value Ref Range   Lipase 20 11 - 51 U/L  Urinalysis, Routine w reflex microscopic  Result Value Ref Range   Color, Urine YELLOW YELLOW   APPearance CLOUDY (A) CLEAR   Specific Gravity, Urine 1.027 1.005 - 1.030   pH 7.0 5.0 - 8.0   Glucose, UA NEGATIVE NEGATIVE mg/dL   Hgb urine dipstick NEGATIVE  NEGATIVE   Bilirubin Urine NEGATIVE NEGATIVE   Ketones, ur 20 (A) NEGATIVE mg/dL   Protein, ur 30 (A) NEGATIVE mg/dL   Nitrite NEGATIVE NEGATIVE   Leukocytes, UA MODERATE (A) NEGATIVE   RBC / HPF 6-30 0 - 5 RBC/hpf   WBC, UA 6-30 0 - 5 WBC/hpf   Bacteria, UA RARE (A) NONE SEEN   Squamous Epithelial / LPF 6-30 (A) NONE SEEN   Mucus PRESENT   I-Stat beta hCG blood, ED  Result Value Ref Range   I-stat hCG, quantitative <5.0 <5 mIU/mL   Comment 3            Laboratory interpretation all normal except hypokalemia (from albuterol), mild anemia, trichomoniasis, bacterial vaginosis   Dg Chest 2 View  Result Date: 06/14/2017 CLINICAL DATA:  Cough and congestion with headache and dyspnea. EXAM: CHEST  2 VIEW COMPARISON:  06/29/2016 FINDINGS: The heart size and mediastinal contours are within normal limits. Both lungs are clear. The visualized skeletal structures are unremarkable. IMPRESSION: No active cardiopulmonary disease. Electronically Signed   By: Tollie Eth M.D.   On: 06/14/2017 23:17   Ct Abdomen Pelvis W Contrast  Result Date: 06/15/2017 CLINICAL DATA:  Cough, congestion, wheezing, headache, shortness of breath, and chest pain. Epigastric abdominal pain. EXAM: CT ABDOMEN AND PELVIS WITH CONTRAST TECHNIQUE: Multidetector CT imaging of the abdomen and pelvis was performed using the standard protocol following bolus administration of intravenous contrast. CONTRAST:  ISOVUE-300 IOPAMIDOL (ISOVUE-300) INJECTION 61% COMPARISON:  None. FINDINGS: Lower chest: The lung bases are clear. Hepatobiliary: No focal liver abnormality is seen. No gallstones, gallbladder wall thickening, or biliary dilatation. Pancreas: Unremarkable. No pancreatic ductal dilatation or surrounding inflammatory changes. Spleen: Normal in size without focal abnormality. Adrenals/Urinary Tract: Adrenal glands are unremarkable. Kidneys are normal, without renal calculi, focal lesion, or hydronephrosis. Bladder wall is  thickened which may indicate cystitis. Correlation with urinalysis recommended. Stomach/Bowel: Stomach, small bowel, and colon are not abnormally distended. Scattered stool throughout the colon. No bowel or colonic wall thickening is appreciated. Appendix is normal. Vascular/Lymphatic: No significant vascular findings are present. No enlarged abdominal or pelvic lymph nodes. Reproductive: Uterus and bilateral adnexa are unremarkable. Other: No abdominal wall hernia or abnormality. No abdominopelvic ascites. Musculoskeletal: No acute or significant osseous findings. IMPRESSION: No acute process demonstrated in  the abdomen or pelvis. No evidence of bowel obstruction or inflammation. Electronically Signed   By: Burman Nieves M.D.   On: 06/15/2017 04:56    Medications  albuterol (PROVENTIL,VENTOLIN) solution continuous neb (0 mg/hr Nebulization Stopped 06/15/17 0016)  cefTRIAXone (ROCEPHIN) 1 g in dextrose 5 % 50 mL IVPB (1 g Intravenous New Bag/Given 06/15/17 0656)  sodium chloride 0.9 % bolus 1,000 mL (not administered)  ipratropium (ATROVENT) nebulizer solution 0.5 mg (0.5 mg Nebulization Given 06/14/17 2316)  predniSONE (DELTASONE) tablet 40 mg (40 mg Oral Given 06/14/17 2244)  dicyclomine (BENTYL) injection 20 mg (20 mg Intramuscular Given 06/15/17 0245)  albuterol (PROVENTIL) (2.5 MG/3ML) 0.083% nebulizer solution 5 mg (5 mg Nebulization Given 06/15/17 0153)  ipratropium (ATROVENT) nebulizer solution 0.5 mg (0.5 mg Nebulization Given 06/15/17 0153)  sodium chloride 0.9 % bolus 1,000 mL (0 mLs Intravenous Stopped 06/15/17 0535)  metoCLOPramide (REGLAN) injection 10 mg (10 mg Intravenous Given 06/15/17 0409)  diphenhydrAMINE (BENADRYL) injection 25 mg (25 mg Intravenous Given 06/15/17 0410)  albuterol (PROVENTIL) (2.5 MG/3ML) 0.083% nebulizer solution 5 mg (5 mg Nebulization Given 06/15/17 0351)  ipratropium (ATROVENT) nebulizer solution 0.5 mg (0.5 mg Nebulization Given 06/15/17 0351)  iopamidol  (ISOVUE-300) 61 % injection 100 mL (100 mLs Intravenous Contrast Given 06/15/17 0439)  potassium chloride SA (K-DUR,KLOR-CON) CR tablet 40 mEq (40 mEq Oral Given 06/15/17 0655)     Diagnoses that have been ruled out:  None  Diagnoses that are still under consideration:  None  Final diagnoses:  Hypokalemia  Mild intermittent asthma with exacerbation  PID (pelvic inflammatory disease)  Trichomoniasis  Bacterial vaginosis    New Prescriptions   ALBUTEROL (PROVENTIL HFA;VENTOLIN HFA) 108 (90 BASE) MCG/ACT INHALER    Inhale 2 puffs into the lungs every 4 (four) hours as needed.   DOXYCYCLINE (VIBRAMYCIN) 100 MG CAPSULE    Take 1 capsule (100 mg total) by mouth 2 (two) times daily.   METRONIDAZOLE (FLAGYL) 500 MG TABLET    Take 1 tablet (500 mg total) by mouth 2 (two) times daily.   NAPROXEN (NAPROSYN) 500 MG TABLET    Take 1 po BID with food prn pain   POTASSIUM CHLORIDE 20 MEQ TBCR    Take 20 mEq by mouth 2 (two) times daily.   PREDNISONE (DELTASONE) 20 MG TABLET    Take 3 po QD x 3d , then 2 po QD x 3d then 1 po QD x 3d    Plan discharge  Devoria Albe, MD, Concha Pyo, MD 06/15/17 [phone]

## 2017-06-15 NOTE — ED Provider Notes (Signed)
[facility]-EMERGENCY DEPT Provider Note   CSN: [id number] Arrival date & time: 06/15/17  2130     History   Chief Complaint Chief Complaint  Patient presents with  . Shortness of Breath    HPI Sharon Nash is a 21 y.o. female.  The history is provided by the patient.  Shortness of Breath  This is a recurrent problem. The problem occurs frequently.The current episode started yesterday. The problem has not changed since onset.Associated symptoms include cough, wheezing, chest pain and abdominal pain. Pertinent negatives include no fever and no hemoptysis. Associated medical issues include asthma.   Patient with known h/o asthma presents for repeat ED visit for continued cough/wheeze/shortness of breath and vomiting She had extensive ED workup on 10/6-10/7, went home but began to vomit She has chest tightness due to asthma/cough No fever No hemoptysis  Past Medical History:  Diagnosis Date  . Asthma   . Chronic abdominal pain   . Chronic knee pain   . Respiratory failure, acute (HCC) 06/2012   bipap only, not intubated, due to asthma exacerbation    Patient Active Problem List   Diagnosis Date Noted  . Gastroesophageal reflux disease without esophagitis 01/27/2017  . Active labor at term 03/02/2016  . Rh negative state in antepartum period 02/26/2016  . 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 Supervision of normal pregnancy 09/13/2015  . Hyperemesis arising during pregnancy 08/30/2015  . Upper gastrointestinal bleeding 08/28/2015  . Status asthmaticus 07/06/2012  . Acute respiratory failure (HCC) 07/06/2012  . Asthma exacerbation 07/05/2012    Past Surgical History:  Procedure Laterality Date  . None      OB History    Gravida Para Term Preterm AB Living   0 0 2   SAB TAB Ectopic Multiple Live Births   0 0 0 0 2       Home Medications    Prior to Admission medications   Medication Sig Start Date End Date Taking? Authorizing Provider  albuterol  (PROVENTIL HFA;VENTOLIN HFA) 108 (90 Base) MCG/ACT inhaler Inhale 2 puffs into the lungs every 4 (four) hours as needed. 06/15/17   Devoria Albe, MD  azithromycin (ZITHROMAX Z-PAK) 250 MG tablet Take 2 tablets (500 mg) on  Day 1,  followed by 1 tablet (250 mg) once daily on Days 2 through 5. 01/24/17   Campbell Riches, NP  budesonide-formoterol (SYMBICORT) 80-4.5 MCG/ACT inhaler Inhale 2 puffs into the lungs 2 (two) times daily. To prevent wheezing 01/24/17   Campbell Riches, NP  cetirizine (ZYRTEC ALLERGY) 10 MG tablet Take 1 tablet (10 mg total) by mouth daily. 01/24/17   Campbell Riches, NP  doxycycline (VIBRAMYCIN) 100 MG capsule Take 1 capsule (100 mg total) by mouth 2 (two) times daily. 06/15/17   Devoria Albe, MD  fluticasone (FLONASE) 50 MCG/ACT nasal spray Place 2 sprays into both nostrils daily. 01/24/17   Campbell Riches, NP  Guaifenesin 1200 MG TB12 Take 1 tablet (1,200 mg total) by mouth 2 (two) times daily. 01/22/17   Lawyer, Cristal Deer, PA-C  metroNIDAZOLE (FLAGYL) 500 MG tablet Take 1 tablet (500 mg total) by mouth 2 (two) times daily. 06/15/17   Devoria Albe, MD  naproxen (NAPROSYN) 500 MG tablet Take 1 po BID with food prn pain 06/15/17   Devoria Albe, MD  pantoprazole (PROTONIX) 40 MG tablet Take 1 tablet (40 mg total) by mouth daily. Prn acid reflux 01/24/17   Campbell Riches, NP  pantoprazole (PROTONIX) 40 MG tablet  Take 40 mg by mouth daily.    [provider]  potassium chloride 20 MEQ TBCR Take 20 mEq by mouth 2 (two) times daily. 06/15/17   Devoria Albe, MD  predniSONE (DELTASONE) 20 MG tablet Take 3 po QD x 3d , then 2 po QD x 3d then 1 po QD x 3d 06/15/17   Devoria Albe, MD  triamcinolone (NASACORT AQ) 55 MCG/ACT AERO nasal inhaler Place 2 sprays into the nose daily. 06/29/16   Muthersbaugh, Dahlia Client, PA-C    Family History Family History  Problem Relation Age of Onset  . Asthma Sister   . Diabetes Maternal Grandmother   . Hypertension Maternal Grandmother   . Diabetes  Maternal Grandfather   . Asthma Maternal Grandfather   . Breast cancer Maternal Aunt   . Colon cancer Neg Hx     Social History Social History  Substance Use Topics  . Smoking status: Never Smoker  . Smokeless tobacco: Never Used  . Alcohol use No     Allergies   Shrimp [shellfish allergy]   Review of Systems Review of Systems  Constitutional: Positive for fatigue. Negative for fever.  Respiratory: Positive for cough, shortness of breath and wheezing. Negative for hemoptysis.   Cardiovascular: Positive for chest pain.  Gastrointestinal: Positive for abdominal pain.  All other systems reviewed and are negative.    Physical Exam Updated Vital Signs BP 136/88   Pulse 92   Temp 98.9 F (37.2 C) (Oral)   Resp (!) 26   Ht 1.727 m ( )   Wt 69.4 kg (153 lb)   LMP 06/08/2017   SpO2 99%   BMI 23.26 kg/m   Physical Exam CONSTITUTIONAL: Sleeping, no distress HEAD: Normocephalic/atraumatic EYES: EOMI/PERRL ENMT: Mucous membranes moist, no stridor NECK: supple no meningeal signs SPINE/BACK:entire spine nontender CV: S1/S2 noted, no murmurs/rubs/gallops noted LUNGS:wheezing bilaterally, no distress noted ABDOMEN: soft, nontender, no rebound or guarding, bowel sounds noted throughout abdomen GU:no cva tenderness NEURO: Pt is awake/alert/appropriate, moves all extremitiesx4.  No facial droop.   EXTREMITIES: pulses normal/equal, full ROM SKIN: warm, color normal PSYCH: no abnormalities of mood noted, alert and oriented to situation   ED Treatments / Results  Labs (all labs ordered are listed, but only abnormal results are displayed) Labs Reviewed  I-STAT CHEM 8, ED - Abnormal; Notable for the following:       Result Value   Glucose, Bld 113 (*)    TCO2 20 (*)    Hemoglobin 10.9 (*)    HCT 32.0 (*)    All other components within normal limits  I-STAT TROPONIN, ED    EKG  EKG Interpretation  Date/Time:  Monday June 16 2017 00:54:53 EDT Ventricular  Rate:  95 PR Interval:    QRS Duration: 86 QT Interval:  352 QTC Calculation: 443 R Axis:   82 Text Interpretation:  Sinus rhythm Borderline short PR interval Consider right atrial enlargement Abnormal ekg Confirmed by Zadie Rhine ([id number]) on 06/16/2017 1:16:31 AM       Radiology Dg Chest 2 View  Result Date: 06/14/2017 CLINICAL DATA:  Cough and congestion with headache and dyspnea. EXAM: CHEST  2 VIEW COMPARISON:  06/29/2016 FINDINGS: The heart size and mediastinal contours are within normal limits. Both lungs are clear. The visualized skeletal structures are unremarkable. IMPRESSION: No active cardiopulmonary disease. Electronically Signed   By: Tollie Eth M.D.   On: 06/14/2017 23:17   Ct Abdomen Pelvis W Contrast  Result Date: 06/15/2017 CLINICAL DATA:  Cough,  congestion, wheezing, headache, shortness of breath, and chest pain. Epigastric abdominal pain. EXAM: CT ABDOMEN AND PELVIS WITH CONTRAST TECHNIQUE: Multidetector CT imaging of the abdomen and pelvis was performed using the standard protocol following bolus administration of intravenous contrast. CONTRAST:  ISOVUE-300 IOPAMIDOL (ISOVUE-300) INJECTION 61% COMPARISON:  None. FINDINGS: Lower chest: The lung bases are clear. Hepatobiliary: No focal liver abnormality is seen. No gallstones, gallbladder wall thickening, or biliary dilatation. Pancreas: Unremarkable. No pancreatic ductal dilatation or surrounding inflammatory changes. Spleen: Normal in size without focal abnormality. Adrenals/Urinary Tract: Adrenal glands are unremarkable. Kidneys are normal, without renal calculi, focal lesion, or hydronephrosis. Bladder wall is thickened which may indicate cystitis. Correlation with urinalysis recommended. Stomach/Bowel: Stomach, small bowel, and colon are not abnormally distended. Scattered stool throughout the colon. No bowel or colonic wall thickening is appreciated. Appendix is normal. Vascular/Lymphatic: No significant vascular  findings are present. No enlarged abdominal or pelvic lymph nodes. Reproductive: Uterus and bilateral adnexa are unremarkable. Other: No abdominal wall hernia or abnormality. No abdominopelvic ascites. Musculoskeletal: No acute or significant osseous findings. IMPRESSION: No acute process demonstrated in the abdomen or pelvis. No evidence of bowel obstruction or inflammation. Electronically Signed   By: Burman Nieves M.D.   On: 06/15/2017 04:56    Procedures Procedures    Medications Ordered in ED Medications  ipratropium-albuterol (DUONEB) 0.5-2.5 (3) MG/3ML nebulizer solution 3 mL (3 mLs Nebulization Given 06/15/17 2202)  ondansetron (ZOFRAN-ODT) disintegrating tablet 8 mg (8 mg Oral Given 06/16/17 0114)  albuterol (PROVENTIL) (2.5 MG/3ML) 0.083% nebulizer solution 5 mg (5 mg Nebulization Given 06/16/17 0020)     Initial Impression / Assessment and Plan / ED Course  I have reviewed the triage vital signs and the nursing notes.       11:57 PM Pt in the ED for repeat ED visit She just had extensive ED workup including labs/CT/CXR Other than hypokalemia, all other workup negative Plan at this time is to treat wheezing, ensure PO and reassess Pt overall well appearing    After monitoring in the ED, pt improved She had minimal residual wheeze She ambulated without hypoxia Her work of breathing is improved She is taking PO fluids She requests zofran and albuterol for nebulizer machine at home I feel she is safe/appropriate for d/c home EKG abnormal, though when compared to prior to significant change Her potassium is improved prior ED visit   Final Clinical Impressions(s) / ED Diagnoses   Final diagnoses:  Moderate persistent asthma with exacerbation    New Prescriptions Discharge Medication List as of 06/16/2017  2:33 AM    START taking these medications   Details  albuterol (PROVENTIL) (2.5 MG/3ML) 0.083% nebulizer solution Take 3 mLs (2.5 mg total) by nebulization  every 6 (six) hours as needed for wheezing or shortness of breath., Starting Mon 06/16/2017, Print    ondansetron (ZOFRAN ODT) 8 MG disintegrating tablet  ODT q4 hours prn nausea, Print         Zadie Rhine, MD 06/16/17 [phone]

## 2017-06-15 NOTE — ED Notes (Signed)
Patient ambulated around nursing station. Patient states that she feels very weak at this time. Patient 02 sat 98-100 percent on room air. Patient's heart 120-143, respirations 28. Patient seems out of breath upon returning to room. Patient has a rattling cough.

## 2017-06-16 LAB — I-STAT CHEM 8, ED
BUN: 6 mg/dL (ref 6–20)
CREATININE: 0.5 mg/dL (ref 0.44–1.00)
Calcium, Ion: 1.2 mmol/L (ref 1.15–1.40)
Chloride: 108 mmol/L (ref 101–111)
Glucose, Bld: 113 mg/dL — ABNORMAL HIGH (ref 65–99)
HEMATOCRIT: 32 % — AB (ref 36.0–46.0)
HEMOGLOBIN: 10.9 g/dL — AB (ref 12.0–15.0)
Potassium: 3.5 mmol/L (ref 3.5–5.1)
SODIUM: 140 mmol/L (ref 135–145)
TCO2: 20 mmol/L — AB (ref 22–32)

## 2017-06-16 LAB — I-STAT TROPONIN, ED: Troponin i, poc: 0 ng/mL (ref 0.00–0.08)

## 2017-06-16 LAB — GC/CHLAMYDIA PROBE AMP (~~LOC~~) NOT AT ARMC
CHLAMYDIA, DNA PROBE: POSITIVE — AB
Neisseria Gonorrhea: NEGATIVE

## 2017-06-16 MED ORDER — ONDANSETRON 8 MG PO TBDP: ORAL_TABLET | ORAL | 0 refills | Status: DC

## 2017-06-16 MED ORDER — ALBUTEROL SULFATE (2.5 MG/3ML) 0.083% IN NEBU: 2.5000 mg | INHALATION_SOLUTION | Freq: Four times a day (QID) | RESPIRATORY_TRACT | 12 refills | Status: DC | PRN

## 2017-06-16 NOTE — ED Notes (Signed)
Patient states that she is still nauseated at this time. States that nurse was suppose to bring her medication an hour ago. Stated to patient that I would check with her nurse.

## 2017-06-16 NOTE — ED Notes (Signed)
Cup of water given at this time

## 2017-06-16 NOTE — ED Notes (Signed)
Patient ambulated around nurses station. Patient's O2 sat 98 percent room air. Respirations 28, heart rate while walking 109-120. Patient states that she feels a little short of breath at this time. Upon returning to room patient's heart rate 68, O2 sat 98 room air.

## 2017-06-19 LAB — HIV ANTIBODY (ROUTINE TESTING W REFLEX): HIV Screen 4th Generation wRfx: NONREACTIVE

## 2017-06-19 LAB — RPR: RPR Ser Ql: NONREACTIVE

## 2017-06-29 ENCOUNTER — Ambulatory Visit (HOSPITAL_COMMUNITY)
Admission: EM | Admit: 2017-06-29 | Discharge: 2017-06-29 | Disposition: A | Discharge status: Home / Self Care | Payer: Medicaid Other | Attending: Emergency Medicine | Admitting: Emergency Medicine

## 2017-06-29 ENCOUNTER — Encounter (HOSPITAL_COMMUNITY): Payer: Self-pay | Admitting: Emergency Medicine

## 2017-06-29 DIAGNOSIS — R0602 Shortness of breath: Secondary | ICD-10-CM

## 2017-06-29 DIAGNOSIS — R05 Cough: Secondary | ICD-10-CM | POA: Diagnosis not present

## 2017-06-29 DIAGNOSIS — J4541 Moderate persistent asthma with (acute) exacerbation: Secondary | ICD-10-CM | POA: Diagnosis not present

## 2017-06-29 DIAGNOSIS — R062 Wheezing: Secondary | ICD-10-CM

## 2017-06-29 MED ORDER — IPRATROPIUM-ALBUTEROL 0.5-2.5 (3) MG/3ML IN SOLN: 3.0000 mL | RESPIRATORY_TRACT | 0 refills | Status: DC | PRN

## 2017-06-29 MED ORDER — BUDESONIDE-FORMOTEROL FUMARATE 80-4.5 MCG/ACT IN AERO: 2.0000 | INHALATION_SPRAY | Freq: Two times a day (BID) | RESPIRATORY_TRACT | 1 refills | Status: DC

## 2017-06-29 MED ORDER — ALBUTEROL SULFATE HFA 108 (90 BASE) MCG/ACT IN AERS: 1.0000 | INHALATION_SPRAY | Freq: Four times a day (QID) | RESPIRATORY_TRACT | 0 refills | Status: DC | PRN

## 2017-06-29 MED ORDER — METHYLPREDNISOLONE SODIUM SUCC 125 MG IJ SOLR
80.0000 mg | Freq: Once | INTRAMUSCULAR | Status: AC
  Administered 2017-06-29: 80 mg via INTRAMUSCULAR

## 2017-06-29 MED ORDER — IPRATROPIUM-ALBUTEROL 0.5-2.5 (3) MG/3ML IN SOLN
3.0000 mL | Freq: Once | RESPIRATORY_TRACT | Status: AC
  Administered 2017-06-29: 3 mL via RESPIRATORY_TRACT

## 2017-06-29 MED ORDER — IPRATROPIUM-ALBUTEROL 0.5-2.5 (3) MG/3ML IN SOLN
RESPIRATORY_TRACT | Status: AC
  Filled 2017-06-29: qty 3

## 2017-06-29 MED ORDER — PREDNISONE 20 MG PO TABS
20.0000 mg | ORAL_TABLET | Freq: Every day | ORAL | 0 refills | Status: DC
Start: 2017-06-29 — End: 2017-07-17

## 2017-06-29 MED ORDER — METHYLPREDNISOLONE SODIUM SUCC 125 MG IJ SOLR
INTRAMUSCULAR | Status: AC
  Filled 2017-06-29: qty 2

## 2017-06-29 NOTE — ED Triage Notes (Signed)
Pt c/o asthma attack, audible wheezing noted. Tachycardic.

## 2017-06-29 NOTE — ED Notes (Signed)
Natalie NP in to evaluate patient. Spoke with patient about possibly going to the ER, pt stated she wanted to be treated here.

## 2017-06-29 NOTE — ED Provider Notes (Signed)
[facility]    CSN: [id number] Arrival date & time: 06/29/17  1923     History   Chief Complaint Chief Complaint  Patient presents with  . Asthma    HPI Delaine Lameiquria D Peeks is a 21 y.o. female.   Littie presents with complaints of shortness of breath and wheezing which worsened today. She states she has been fighting this "this entire month" with frequent er visits. She is still currently on a prednisone taper, she took 20mg  today. She states she feels she felt improved when she was on 60mg . Without fevers. She has been using her pro air as well as duonebs at home. She has had respiratory failure in the past, has not required intubation. She feels this is similar to previous exacerbations. Without fevers, runny nose, sore throat. Denies chest pain. Has mild diarrhea and nausea.       Past Medical History:  Diagnosis Date  . Asthma   . Chronic abdominal pain   . Chronic knee pain   . Respiratory failure, acute (HCC) 06/2012   bipap only, not intubated, due to asthma exacerbation    Patient Active Problem List   Diagnosis Date Noted  . Gastroesophageal reflux disease without esophagitis 01/27/2017  . Active labor at term 03/02/2016  . Rh negative state in antepartum period 02/26/2016  . 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 Supervision of normal pregnancy 09/13/2015  . Hyperemesis arising during pregnancy 08/30/2015  . Upper gastrointestinal bleeding 08/28/2015  . Status asthmaticus 07/06/2012  . Acute respiratory failure (HCC) 07/06/2012  . Asthma exacerbation 07/05/2012    Past Surgical History:  Procedure Laterality Date  . None      OB History    Gravida Para Term Preterm AB Living   2 2 2  0 0 2   SAB TAB Ectopic Multiple Live Births   0 0 0 0 2       Home Medications    Prior to Admission medications   Medication Sig Start Date End Date Taking? Authorizing Provider  albuterol (PROVENTIL) (2.5 MG/3ML) 0.083% nebulizer solution Take 3 mLs  (2.5 mg total) by nebulization every 6 (six) hours as needed for wheezing or shortness of breath. 06/16/17   Zadie RhineWickline, Donald, MD  azithromycin (ZITHROMAX Z-PAK) 250 MG tablet Take 2 tablets (500 mg) on  Day 1,  followed by 1 tablet (250 mg) once daily on Days 2 through 5. 01/24/17   Campbell RichesHoskins, Carolyn C, NP  budesonide-formoterol (SYMBICORT) 80-4.5 MCG/ACT inhaler Inhale 2 puffs into the lungs 2 (two) times daily. To prevent wheezing 01/24/17   Campbell RichesHoskins, Carolyn C, NP  cetirizine (ZYRTEC ALLERGY) 10 MG tablet Take 1 tablet (10 mg total) by mouth daily. 01/24/17   Campbell RichesHoskins, Carolyn C, NP  doxycycline (VIBRAMYCIN) 100 MG capsule Take 1 capsule (100 mg total) by mouth 2 (two) times daily. 06/15/17   Devoria AlbeKnapp, Iva, MD  fluticasone (FLONASE) 50 MCG/ACT nasal spray Place 2 sprays into both nostrils daily. 01/24/17   Campbell RichesHoskins, Carolyn C, NP  Guaifenesin 1200 MG TB12 Take 1 tablet (1,200 mg total) by mouth 2 (two) times daily. 01/22/17   Lawyer, Cristal Deerhristopher, PA-C  metroNIDAZOLE (FLAGYL) 500 MG tablet Take 1 tablet (500 mg total) by mouth 2 (two) times daily. 06/15/17   Devoria AlbeKnapp, Iva, MD  naproxen (NAPROSYN) 500 MG tablet Take 1 po BID with food prn pain 06/15/17   Devoria AlbeKnapp, Iva, MD  ondansetron (ZOFRAN ODT) 8 MG disintegrating tablet 8mg  ODT q4 hours prn nausea 06/16/17   Wickline,  Dorinda Hill, MD  pantoprazole (PROTONIX) 40 MG tablet Take 1 tablet (40 mg total) by mouth daily. Prn acid reflux 01/24/17   Campbell Riches, NP  pantoprazole (PROTONIX) 40 MG tablet Take 40 mg by mouth daily.    [provider]  potassium chloride 20 MEQ TBCR Take 20 mEq by mouth 2 (two) times daily. 06/15/17   Devoria Albe, MD  predniSONE (DELTASONE) 20 MG tablet Take 3 po QD x 3d , then 2 po QD x 3d then 1 po QD x 3d 06/15/17   Devoria Albe, MD  triamcinolone (NASACORT AQ) 55 MCG/ACT AERO nasal inhaler Place 2 sprays into the nose daily. 06/29/16   Muthersbaugh, Dahlia Client, PA-C    Family History Family History  Problem Relation Age of Onset  .  Asthma Sister   . Diabetes Maternal Grandmother   . Hypertension Maternal Grandmother   . Diabetes Maternal Grandfather   . Asthma Maternal Grandfather   . Breast cancer Maternal Aunt   . Colon cancer Neg Hx     Social History Social History  Substance Use Topics  . Smoking status: Never Smoker  . Smokeless tobacco: Never Used  . Alcohol use No     Allergies   Shrimp [shellfish allergy]   Review of Systems Review of Systems  Constitutional: Negative.   HENT: Negative.   Eyes: Negative.   Respiratory: Positive for cough, shortness of breath and wheezing.   Cardiovascular: Negative.   Gastrointestinal: Positive for diarrhea and nausea. Negative for rectal pain and vomiting.  Genitourinary: Negative.   Musculoskeletal: Negative.   Neurological: Negative.      Physical Exam Triage Vital Signs ED Triage Vitals  Enc Vitals Group     BP 06/29/17 1934 (!) 150/95     Pulse Rate 06/29/17 1934 (!) 126     Resp 06/29/17 1934 (!) 30     Temp 06/29/17 1934 98.6 F (37 C)     Temp Source 06/29/17 1934 Oral     SpO2 06/29/17 1934 100 %     Weight --      Height --      Head Circumference --      Peak Flow --      Pain Score 06/29/17 1935 0     Pain Loc --      Pain Edu? --      Excl. in GC? --    No data found.   Updated Vital Signs BP 137/82 (BP Location: Right Arm)   Pulse (!) 108   Temp 98.6 F (37 C) (Oral)   Resp (!) 24   LMP 06/08/2017   SpO2 97%   Visual Acuity Right Eye Distance:   Left Eye Distance:   Bilateral Distance:    Right Eye Near:   Left Eye Near:    Bilateral Near:     Physical Exam  Constitutional: She is oriented to person, place, and time. She appears well-developed and well-nourished.  Eyes: Pupils are equal, round, and reactive to light.  Neck: Normal range of motion.  Cardiovascular: Regular rhythm and normal heart sounds.   Pulmonary/Chest: Effort normal. No accessory muscle usage. Tachypnea noted. She has wheezes in the  right upper field and the left upper field.  Wheezing improved s/p duoneb and solumedrol  Abdominal: Soft. There is no tenderness.  Neurological: She is alert and oriented to person, place, and time.  Skin: Skin is warm and dry.     UC Treatments / Results  Labs (all labs  ordered are listed, but only abnormal results are displayed) Labs Reviewed - No data to display  EKG  EKG Interpretation None       Radiology No results found.  Procedures Procedures (including critical care time)  Medications Ordered in UC Medications  ipratropium-albuterol (DUONEB) 0.5-2.5 (3) MG/3ML nebulizer solution 3 mL (3 mLs Nebulization Given 06/29/17 1959)  methylPREDNISolone sodium succinate (SOLU-MEDROL) 125 mg/2 mL injection 80 mg (80 mg Intramuscular Given 06/29/17 1959)     Initial Impression / Assessment and Plan / UC Course  I have reviewed the triage vital signs and the nursing notes.  Pertinent labs & imaging results that were available during my care of the patient were reviewed by me and considered in my medical decision making (see chart for details).  Clinical Course as of Jun 29 2037  Wynelle Link Jun 29, 2017  2001 Patient assesed in triage per nursing request. With Tachypnea and audible wheezing, history of ICU stays due to exacerbation of asthma. Took 20mg  of prednisone today as well as used duoneb and albuterol inhaler. Duoneb and solumedrol ordered at this time.   [NB]  2033 Patient states she is already feeling improved. RR improved at this time. She is sitting and playing with her child in room, without distress. Vitals to be rechecked by nursing  [NB]    Clinical Course User Index [NB] Georgetta Haber, NP    Findings consistent with asthma exacerbation. Patient significantly improved, verbalized improvement as well, with RR at 22 at time of departure, o2 at 97% on RA and minimal wheezing noted. Will increase prednisone back up at this time as patient improved tonight with solu  medrol. Patient had been using albuterol nebulizers, provided duonebs as needed. had not filled her previously prescribed symbicort, re-prescribed this as well.  Recommend close follow up in two days with PCP for recheck. Continue with PRN use of proair and duonebs. If symptoms return, become increasingly short of breath or develop chest pain to return to clinic or visit ed. Patient agreeable and verbalized understanding of instructions.    Final Clinical Impressions(s) / UC Diagnoses   Final diagnoses:  None    New Prescriptions New Prescriptions   No medications on file     Controlled Substance Prescriptions [place] Controlled Substance Registry consulted? Not Applicable   Georgetta Haber, NP 06/29/17 2130

## 2017-07-17 ENCOUNTER — Emergency Department (HOSPITAL_COMMUNITY): Payer: Medicaid Other

## 2017-07-17 ENCOUNTER — Emergency Department (HOSPITAL_COMMUNITY)
Admission: EM | Admit: 2017-07-17 | Discharge: 2017-07-17 | Disposition: A | Discharge status: Home / Self Care | Payer: Medicaid Other | Attending: Emergency Medicine | Admitting: Emergency Medicine

## 2017-07-17 ENCOUNTER — Other Ambulatory Visit: Payer: Self-pay

## 2017-07-17 ENCOUNTER — Encounter (HOSPITAL_COMMUNITY): Payer: Self-pay | Admitting: Emergency Medicine

## 2017-07-17 DIAGNOSIS — R062 Wheezing: Secondary | ICD-10-CM | POA: Diagnosis present

## 2017-07-17 DIAGNOSIS — J4541 Moderate persistent asthma with (acute) exacerbation: Secondary | ICD-10-CM | POA: Insufficient documentation

## 2017-07-17 DIAGNOSIS — Z79899 Other long term (current) drug therapy: Secondary | ICD-10-CM | POA: Insufficient documentation

## 2017-07-17 LAB — BASIC METABOLIC PANEL
Anion gap: 7 (ref 5–15)
BUN: 7 mg/dL (ref 6–20)
CALCIUM: 8.8 mg/dL — AB (ref 8.9–10.3)
CHLORIDE: 108 mmol/L (ref 101–111)
CO2: 23 mmol/L (ref 22–32)
CREATININE: 0.67 mg/dL (ref 0.44–1.00)
GFR calc non Af Amer: >60 mL/min (ref >60)
Glucose, Bld: 99 mg/dL (ref 65–99)
Potassium: 3.1 mmol/L — ABNORMAL LOW (ref 3.5–5.1)
SODIUM: 138 mmol/L (ref 135–145)

## 2017-07-17 LAB — CBC WITH DIFFERENTIAL/PLATELET
BASOS PCT: 0 %
Basophils Absolute: 0 10*3/uL (ref 0.0–0.1)
EOS ABS: 0.1 10*3/uL (ref 0.0–0.7)
EOS PCT: 1 %
HCT: 36.5 % (ref 36.0–46.0)
Hemoglobin: 11.8 g/dL — ABNORMAL LOW (ref 12.0–15.0)
LYMPHS ABS: 0.8 10*3/uL (ref 0.7–4.0)
Lymphocytes Relative: 13 %
MCH: 29.1 pg (ref 26.0–34.0)
MCHC: 32.3 g/dL (ref 30.0–36.0)
MCV: 90.1 fL (ref 78.0–100.0)
MONOS PCT: 4 %
Monocytes Absolute: 0.3 10*3/uL (ref 0.1–1.0)
NEUTROS PCT: 82 %
Neutro Abs: 5.2 10*3/uL (ref 1.7–7.7)
PLATELETS: 310 10*3/uL (ref 150–400)
RBC: 4.05 MIL/uL (ref 3.87–5.11)
RDW: 13.2 % (ref 11.5–15.5)
WBC: 6.4 10*3/uL (ref 4.0–10.5)

## 2017-07-17 LAB — I-STAT BETA HCG BLOOD, ED (MC, WL, AP ONLY)

## 2017-07-17 MED ORDER — METHYLPREDNISOLONE SODIUM SUCC 125 MG IJ SOLR
125.0000 mg | Freq: Once | INTRAMUSCULAR | Status: AC
  Administered 2017-07-17: 125 mg via INTRAVENOUS
  Filled 2017-07-17: qty 2

## 2017-07-17 MED ORDER — ACETAMINOPHEN 500 MG PO TABS
1000.0000 mg | ORAL_TABLET | Freq: Once | ORAL | Status: AC
  Administered 2017-07-17: 1000 mg via ORAL
  Filled 2017-07-17: qty 2

## 2017-07-17 MED ORDER — PREDNISONE 10 MG PO TABS: 20.0000 mg | ORAL_TABLET | Freq: Two times a day (BID) | ORAL | 0 refills | Status: DC

## 2017-07-17 MED ORDER — ONDANSETRON HCL 4 MG/2ML IJ SOLN
4.0000 mg | Freq: Once | INTRAMUSCULAR | Status: AC
  Administered 2017-07-17: 4 mg via INTRAVENOUS
  Filled 2017-07-17: qty 2

## 2017-07-17 MED ORDER — MAGNESIUM SULFATE 2 GM/50ML IV SOLN
2.0000 g | Freq: Once | INTRAVENOUS | Status: AC
  Administered 2017-07-17: 2 g via INTRAVENOUS
  Filled 2017-07-17: qty 50

## 2017-07-17 MED ORDER — IPRATROPIUM-ALBUTEROL 0.5-2.5 (3) MG/3ML IN SOLN
3.0000 mL | RESPIRATORY_TRACT | Status: DC
Start: 2017-07-17 — End: 2017-07-17

## 2017-07-17 MED ORDER — ALBUTEROL SULFATE (2.5 MG/3ML) 0.083% IN NEBU
5.0000 mg | INHALATION_SOLUTION | Freq: Once | RESPIRATORY_TRACT | Status: AC
  Administered 2017-07-17: 5 mg via RESPIRATORY_TRACT

## 2017-07-17 MED ORDER — ALBUTEROL SULFATE (2.5 MG/3ML) 0.083% IN NEBU
INHALATION_SOLUTION | RESPIRATORY_TRACT | Status: AC
  Administered 2017-07-17: 5 mg via RESPIRATORY_TRACT
  Filled 2017-07-17: qty 6

## 2017-07-17 MED ORDER — IPRATROPIUM-ALBUTEROL 0.5-2.5 (3) MG/3ML IN SOLN
3.0000 mL | Freq: Once | RESPIRATORY_TRACT | Status: AC
  Administered 2017-07-17: 3 mL via RESPIRATORY_TRACT
  Filled 2017-07-17: qty 3

## 2017-07-17 NOTE — ED Provider Notes (Signed)
[facility] Provider Note   CSN: [id number] Arrival date & time: 07/17/17  [id number]     History   Chief Complaint Chief Complaint  Patient presents with  . Asthma    HPI Sharon Nash is a 21 y.o. female.  Patient is a 21 year old female with history of asthma.  She presents with a 2-day history of wheezing and shortness of breath.  She denies any chest pain but does report cough with clear mucus.  She denies any fevers or chills.  She has tried her home nebulizer with little relief.   The history is provided by the patient.  Asthma  This is a recurrent problem. The current episode started 2 days ago. The problem occurs constantly. The problem has been gradually worsening. Associated symptoms include shortness of breath. Pertinent negatives include no chest pain. Nothing aggravates the symptoms. Nothing relieves the symptoms.    Past Medical History:  Diagnosis Date  . Asthma   . Chronic abdominal pain   . Chronic knee pain   . Respiratory failure, acute (HCC) 06/2012   bipap only, not intubated, due to asthma exacerbation    Patient Active Problem List   Diagnosis Date Noted  . Gastroesophageal reflux disease without esophagitis 01/27/2017  . Active labor at term 03/02/2016  . Rh negative state in antepartum period 02/26/2016  . 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 Supervision of normal pregnancy 09/13/2015  . Hyperemesis arising during pregnancy 08/30/2015  . Upper gastrointestinal bleeding 08/28/2015  . Status asthmaticus 07/06/2012  . Acute respiratory failure (HCC) 07/06/2012  . Asthma exacerbation 07/05/2012    Past Surgical History:  Procedure Laterality Date  . None      OB History    Gravida Para Term Preterm AB Living   2 2 2  0 0 2   SAB TAB Ectopic Multiple Live Births   0 0 0 0 2       Home Medications    Prior to Admission medications   Medication Sig Start Date End Date Taking? Authorizing Provider   albuterol (PROVENTIL HFA;VENTOLIN HFA) 108 (90 Base) MCG/ACT inhaler Inhale 1-2 puffs into the lungs every 6 (six) hours as needed for wheezing or shortness of breath. 06/29/17   Georgetta HaberBurky, Natalie B, NP  albuterol (PROVENTIL) (2.5 MG/3ML) 0.083% nebulizer solution Take 3 mLs (2.5 mg total) by nebulization every 6 (six) hours as needed for wheezing or shortness of breath. 06/16/17   Zadie RhineWickline, Donald, MD  azithromycin (ZITHROMAX Z-PAK) 250 MG tablet Take 2 tablets (500 mg) on  Day 1,  followed by 1 tablet (250 mg) once daily on Days 2 through 5. 01/24/17   Campbell RichesHoskins, Carolyn C, NP  budesonide-formoterol (SYMBICORT) 80-4.5 MCG/ACT inhaler Inhale 2 puffs into the lungs 2 (two) times daily. To prevent wheezing 06/29/17   Georgetta HaberBurky, Natalie B, NP  cetirizine (ZYRTEC ALLERGY) 10 MG tablet Take 1 tablet (10 mg total) by mouth daily. 01/24/17   Campbell RichesHoskins, Carolyn C, NP  doxycycline (VIBRAMYCIN) 100 MG capsule Take 1 capsule (100 mg total) by mouth 2 (two) times daily. 06/15/17   Devoria AlbeKnapp, Iva, MD  fluticasone (FLONASE) 50 MCG/ACT nasal spray Place 2 sprays into both nostrils daily. 01/24/17   Campbell RichesHoskins, Carolyn C, NP  Guaifenesin 1200 MG TB12 Take 1 tablet (1,200 mg total) by mouth 2 (two) times daily. 01/22/17   Lawyer, Cristal Deerhristopher, PA-C  ipratropium-albuterol (DUONEB) 0.5-2.5 (3) MG/3ML SOLN Take 3 mLs by nebulization every 4 (four) hours as needed. 06/29/17  Georgetta Haber, NP  metroNIDAZOLE (FLAGYL) 500 MG tablet Take 1 tablet (500 mg total) by mouth 2 (two) times daily. 06/15/17   Devoria Albe, MD  naproxen (NAPROSYN) 500 MG tablet Take 1 po BID with food prn pain 06/15/17   Devoria Albe, MD  ondansetron (ZOFRAN ODT) 8 MG disintegrating tablet 8mg  ODT q4 hours prn nausea 06/16/17   Zadie Rhine, MD  pantoprazole (PROTONIX) 40 MG tablet Take 1 tablet (40 mg total) by mouth daily. Prn acid reflux 01/24/17   Campbell Riches, NP  pantoprazole (PROTONIX) 40 MG tablet Take 40 mg by mouth daily.    [provider]    potassium chloride 20 MEQ TBCR Take 20 mEq by mouth 2 (two) times daily. 06/15/17   Devoria Albe, MD  predniSONE (DELTASONE) 20 MG tablet Take 1 tablet (20 mg total) by mouth daily with breakfast. 60mg  x3 days, 40mg  x3 days, 20mg  x3 days 06/29/17   Linus Mako B, NP  triamcinolone (NASACORT AQ) 55 MCG/ACT AERO nasal inhaler Place 2 sprays into the nose daily. 06/29/16   Muthersbaugh, Dahlia Client, PA-C    Family History Family History  Problem Relation Age of Onset  . Asthma Sister   . Diabetes Maternal Grandmother   . Hypertension Maternal Grandmother   . Diabetes Maternal Grandfather   . Asthma Maternal Grandfather   . Breast cancer Maternal Aunt   . Colon cancer Neg Hx     Social History Social History   Tobacco Use  . Smoking status: Never Smoker  . Smokeless tobacco: Never Used  Substance Use Topics  . Alcohol use: No  . Drug use: No     Allergies   Shrimp [shellfish allergy]   Review of Systems Review of Systems  Respiratory: Positive for shortness of breath.   Cardiovascular: Negative for chest pain.  All other systems reviewed and are negative.    Physical Exam Updated Vital Signs BP 126/72   Pulse (!) 112   Temp 97.9 F (36.6 C) (Oral)   Resp 18   Ht 5\' 7"  (1.702 m)   Wt 69.4 kg (153 lb)   LMP 07/06/2017   SpO2 95%   BMI 23.96 kg/m   Physical Exam  Constitutional: She is oriented to person, place, and time. She appears well-developed and well-nourished. No distress.  HENT:  Head: Normocephalic and atraumatic.  Mouth/Throat: Oropharynx is clear and moist.  TMs and ear canals are clear bilaterally.  Neck: Normal range of motion. Neck supple.  Cardiovascular: Normal rate and regular rhythm. Exam reveals no gallop and no friction rub.  No murmur heard. Pulmonary/Chest: Effort normal. No respiratory distress. She has wheezes. She has no rales.  There are bilateral expiratory wheezes present.  There is no respiratory distress.  Abdominal: Soft. Bowel  sounds are normal. She exhibits no distension. There is no tenderness.  Musculoskeletal: Normal range of motion.  Neurological: She is alert and oriented to person, place, and time.  Skin: Skin is warm and dry. She is not diaphoretic.  Nursing note and vitals reviewed.    ED Treatments / Results  Labs (all labs ordered are listed, but only abnormal results are displayed) Labs Reviewed  BASIC METABOLIC PANEL  CBC WITH DIFFERENTIAL/PLATELET  I-STAT BETA HCG BLOOD, ED ([facility], [facility], [facility] ONLY)    EKG  EKG Interpretation None       Radiology No results found.  Procedures Procedures (including critical care time)  Medications Ordered in ED Medications  methylPREDNISolone sodium succinate (SOLU-MEDROL) 125  mg/2 mL injection 125 mg (not administered)  ipratropium-albuterol (DUONEB) 0.5-2.5 (3) MG/3ML nebulizer solution 3 mL (not administered)  magnesium sulfate IVPB 2 g 50 mL (not administered)  albuterol (PROVENTIL) (2.5 MG/3ML) 0.083% nebulizer solution 5 mg (5 mg Nebulization Given 07/17/17 0541)     Initial Impression / Assessment and Plan / ED Course  I have reviewed the triage vital signs and the nursing notes.  Pertinent labs & imaging results that were available during my care of the patient were reviewed by me and considered in my medical decision making (see chart for details).  Patient with history of asthma presenting with wheezing.  She reports a history of respiratory failure requiring intubation.  Today she appears very comfortable with oxygen saturations of 100% after albuterol, steroids, and magnesium.  Her chest x-ray and laboratory studies are reassuring.  I see no indication for admission at this time.  She will be discharged with prednisone and continued home albuterol.  Final Clinical Impressions(s) / ED Diagnoses   Final diagnoses:  None    ED Discharge Orders    None       Geoffery Lyonselo, Gisella Alwine, MD 07/17/17 1110

## 2017-07-17 NOTE — ED Notes (Signed)
Patient transported to X-ray 

## 2017-07-17 NOTE — Discharge Instructions (Signed)
Prednisone as prescribed.  Continue albuterol nebulizer treatments every 4 hours as needed for wheezing.  Return to the emergency department if you develop worsening breathing, severe chest pain, high fevers, or other new and concerning symptoms.

## 2017-07-17 NOTE — ED Notes (Signed)
[employer] [facility] not working at this time. NS notified.

## 2017-07-17 NOTE — ED Triage Notes (Signed)
Patient reports asthma attack onset last night with wheezing , productive cough / nasal congestion /rhinorrhea . Denies fever or chills  .

## 2017-08-11 ENCOUNTER — Other Ambulatory Visit: Payer: Self-pay

## 2017-08-11 ENCOUNTER — Ambulatory Visit (HOSPITAL_COMMUNITY)
Admission: EM | Admit: 2017-08-11 | Discharge: 2017-08-11 | Disposition: A | Discharge status: Home / Self Care | Payer: Medicaid Other | Attending: Urgent Care | Admitting: Urgent Care

## 2017-08-11 ENCOUNTER — Encounter (HOSPITAL_COMMUNITY): Payer: Self-pay | Admitting: Emergency Medicine

## 2017-08-11 DIAGNOSIS — M545 Low back pain, unspecified: Secondary | ICD-10-CM

## 2017-08-11 DIAGNOSIS — W19XXXA Unspecified fall, initial encounter: Secondary | ICD-10-CM

## 2017-08-11 DIAGNOSIS — M25561 Pain in right knee: Secondary | ICD-10-CM | POA: Diagnosis not present

## 2017-08-11 DIAGNOSIS — W108XXA Fall (on) (from) other stairs and steps, initial encounter: Secondary | ICD-10-CM | POA: Diagnosis not present

## 2017-08-11 DIAGNOSIS — S0093XA Contusion of unspecified part of head, initial encounter: Secondary | ICD-10-CM | POA: Diagnosis not present

## 2017-08-11 DIAGNOSIS — S0083XA Contusion of other part of head, initial encounter: Secondary | ICD-10-CM

## 2017-08-11 MED ORDER — METHOCARBAMOL 500 MG PO TABS: 500.0000 mg | ORAL_TABLET | Freq: Every evening | ORAL | 0 refills | Status: DC | PRN

## 2017-08-11 NOTE — ED Triage Notes (Signed)
Pt states today she slid down her steps and hit the back of her head on the steps, twisted her R knee. Pt denies LOC. Pt is ambulatory with steady gait, no obvious injuries or deformities. Pt c/o HA.

## 2017-08-11 NOTE — Discharge Instructions (Signed)
Ibuprofen 600mg  every 6-8 hours for pain.  Knee sleeve for support to right knee. May use muscle relaxer starting tomorrow at night as needed for back spasms. Ice to knee and to head.  If develop increased pain, dizziness, confusion, nausea vomiting or loss of consciousness return to the ER to be seen.

## 2017-08-11 NOTE — ED Provider Notes (Signed)
[facility]    CSN: [id number] Arrival date & time: 08/11/17  1708     History   Chief Complaint Chief Complaint  Patient presents with  . Fall  . Headache  . Knee Pain    HPI Sharon Nash is a 21 y.o. female.   Sharon Nash presents with complaints of of low back pain, right knee pain and posterior head pain after she slipped and fell down approximately 5 steps this evening approximately 2.5 hours ago. She feels she may have briefly lost consciousness. She is not on blood thinners. She feels her knee popped. She has been ambulatory. She took 400 mg ibuprofen approximately 2 hours ago which has not helped. She is not wearing her glasses so feels her vision is blurry. Occasionally feels dizzy. Neck pain. Without weakness, confusion, nausea, vomiting.    ROS per HPI.       Past Medical History:  Diagnosis Date  . Asthma   . Chronic abdominal pain   . Chronic knee pain   . Respiratory failure, acute (HCC) 06/2012   bipap only, not intubated, due to asthma exacerbation    Patient Active Problem List   Diagnosis Date Noted  . Gastroesophageal reflux disease without esophagitis 01/27/2017  . Active labor at term 03/02/2016  . Rh negative state in antepartum period 02/26/2016  . 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 Supervision of normal pregnancy 09/13/2015  . Hyperemesis arising during pregnancy 08/30/2015  . Upper gastrointestinal bleeding 08/28/2015  . Status asthmaticus 07/06/2012  . Acute respiratory failure (HCC) 07/06/2012  . Asthma exacerbation 07/05/2012    Past Surgical History:  Procedure Laterality Date  . None      OB History    Gravida Para Term Preterm AB Living   2 2 2  0 0 2   SAB TAB Ectopic Multiple Live Births   0 0 0 0 2       Home Medications    Prior to Admission medications   Medication Sig Start Date End Date Taking? Authorizing Provider  albuterol (PROVENTIL HFA;VENTOLIN HFA) 108 (90 Base) MCG/ACT inhaler Inhale 1-2  puffs into the lungs every 6 (six) hours as needed for wheezing or shortness of breath. 06/29/17   Georgetta HaberBurky, Natalie B, NP  albuterol (PROVENTIL) (2.5 MG/3ML) 0.083% nebulizer solution Take 3 mLs (2.5 mg total) by nebulization every 6 (six) hours as needed for wheezing or shortness of breath. 06/16/17   Zadie RhineWickline, Donald, MD  budesonide-formoterol (SYMBICORT) 80-4.5 MCG/ACT inhaler Inhale 2 puffs into the lungs 2 (two) times daily. To prevent wheezing 06/29/17   Georgetta HaberBurky, Natalie B, NP  cetirizine (ZYRTEC ALLERGY) 10 MG tablet Take 1 tablet (10 mg total) by mouth daily. 01/24/17   Campbell RichesHoskins, Carolyn C, NP  fluticasone (FLONASE) 50 MCG/ACT nasal spray Place 2 sprays into both nostrils daily. 01/24/17   Campbell RichesHoskins, Carolyn C, NP  Guaifenesin 1200 MG TB12 Take 1 tablet (1,200 mg total) by mouth 2 (two) times daily. 01/22/17   Lawyer, Cristal Deerhristopher, PA-C  ipratropium-albuterol (DUONEB) 0.5-2.5 (3) MG/3ML SOLN Take 3 mLs by nebulization every 4 (four) hours as needed. 06/29/17   Georgetta HaberBurky, Natalie B, NP  methocarbamol (ROBAXIN) 500 MG tablet Take 1 tablet (500 mg total) by mouth at bedtime as needed for muscle spasms. 08/12/17   Georgetta HaberBurky, Natalie B, NP  naproxen (NAPROSYN) 500 MG tablet Take 1 po BID with food prn pain 06/15/17   Devoria AlbeKnapp, Iva, MD  pantoprazole (PROTONIX) 40 MG tablet Take 1 tablet (40 mg total) by  mouth daily. Prn acid reflux 01/24/17   Campbell Riches, NP  pantoprazole (PROTONIX) 40 MG tablet Take 40 mg by mouth daily.    [provider]  predniSONE (DELTASONE) 10 MG tablet Take 2 tablets (20 mg total) 2 (two) times daily with a meal by mouth. 07/17/17   Geoffery Lyons, MD  triamcinolone (NASACORT AQ) 55 MCG/ACT AERO nasal inhaler Place 2 sprays into the nose daily. 06/29/16   Muthersbaugh, Dahlia Client, PA-C    Family History Family History  Problem Relation Age of Onset  . Asthma Sister   . Diabetes Maternal Grandmother   . Hypertension Maternal Grandmother   . Diabetes Maternal Grandfather   . Asthma  Maternal Grandfather   . Breast cancer Maternal Aunt   . Colon cancer Neg Hx     Social History Social History   Tobacco Use  . Smoking status: Never Smoker  . Smokeless tobacco: Never Used  Substance Use Topics  . Alcohol use: No  . Drug use: No     Allergies   Shrimp [shellfish allergy]   Review of Systems Review of Systems   Physical Exam Triage Vital Signs ED Triage Vitals  Enc Vitals Group     BP 08/11/17 1717 115/82     Pulse Rate 08/11/17 1717 86     Resp 08/11/17 1717 16     Temp 08/11/17 1717 98.6 F (37 C)     Temp src --      SpO2 08/11/17 1717 100 %     Weight --      Height --      Head Circumference --      Peak Flow --      Pain Score 08/11/17 1719 8     Pain Loc --      Pain Edu? --      Excl. in GC? --    No data found.  Updated Vital Signs BP 115/82   Pulse 86   Temp 98.6 F (37 C)   Resp 16   LMP 08/05/2017   SpO2 100%   Visual Acuity Right Eye Distance:   Left Eye Distance:   Bilateral Distance:    Right Eye Near:   Left Eye Near:    Bilateral Near:     Physical Exam  Constitutional: She is oriented to person, place, and time. She appears well-developed and well-nourished. She does not appear ill. No distress.  HENT:  Head: Normocephalic and atraumatic. Head is without raccoon's eyes, without Battle's sign, without abrasion, without contusion and without laceration.    Tenderness to occiput without palpable swelling; without laceration  Eyes: EOM are normal. Pupils are equal, round, and reactive to light.  Neck: Normal range of motion.  Cardiovascular: Normal rate, regular rhythm and normal heart sounds.  Pulmonary/Chest: Effort normal and breath sounds normal.  Musculoskeletal: Normal range of motion.       Right knee: She exhibits normal range of motion, no swelling, no effusion, no ecchymosis, no deformity, no laceration, no erythema, normal alignment, no LCL laxity and no bony tenderness. Tenderness found. Lateral  joint line tenderness noted. No patellar tendon tenderness noted.       Cervical back: She exhibits tenderness. She exhibits no bony tenderness.       Lumbar back: She exhibits tenderness. She exhibits no bony tenderness.  Neck musculature with tenderness; with full active ROM without pain; bilateral low back musculature with tenderness; lower extremity strength equal bilaterally, ambulatory; sensation intact; generalized left knee  pain, full active flexion and extension; without bruising or swelling noted.   Neurological: She is alert and oriented to person, place, and time. She has normal strength. She is not disoriented. No cranial nerve deficit or sensory deficit. She displays a negative Romberg sign. Coordination and gait normal. GCS eye subscore is 4. GCS verbal subscore is 5. GCS motor subscore is 6.  Skin: Skin is warm and dry.     UC Treatments / Results  Labs (all labs ordered are listed, but only abnormal results are displayed) Labs Reviewed - No data to display  EKG  EKG Interpretation None       Radiology No results found.  Procedures Procedures (including critical care time)  Medications Ordered in UC Medications - No data to display   Initial Impression / Assessment and Plan / UC Course  I have reviewed the triage vital signs and the nursing notes.  Pertinent labs & imaging results that were available during my care of the patient were reviewed by me and considered in my medical decision making (see chart for details).     Imaging deferred tonight based on benign physical findings on exam. Ice, ibuprofen, rest, right knee sleeve recommended. Robaxin may be started tomorrow at night if needed for spasms. Return precautions provided. Patient verbalized understanding and agreeable to plan.   Final Clinical Impressions(s) / UC Diagnoses   Final diagnoses:  Fall, initial encounter  Acute pain of right knee  Contusion of other part of head, initial encounter    Acute bilateral low back pain without sciatica    ED Discharge Orders        Ordered    methocarbamol (ROBAXIN) 500 MG tablet  At bedtime PRN     08/11/17 1821       Controlled Substance Prescriptions [place] Controlled Substance Registry consulted? Not Applicable   Georgetta HaberBurky, Natalie B, NP 08/11/17 [phone]

## 2017-10-04 ENCOUNTER — Encounter (HOSPITAL_COMMUNITY): Payer: Self-pay | Admitting: *Deleted

## 2017-10-04 ENCOUNTER — Other Ambulatory Visit: Payer: Self-pay

## 2017-10-04 ENCOUNTER — Ambulatory Visit (HOSPITAL_COMMUNITY)
Admission: EM | Admit: 2017-10-04 | Discharge: 2017-10-04 | Disposition: A | Discharge status: Home / Self Care | Payer: Medicaid Other | Attending: Family Medicine | Admitting: Family Medicine

## 2017-10-04 DIAGNOSIS — B9789 Other viral agents as the cause of diseases classified elsewhere: Secondary | ICD-10-CM | POA: Diagnosis not present

## 2017-10-04 DIAGNOSIS — J069 Acute upper respiratory infection, unspecified: Secondary | ICD-10-CM | POA: Diagnosis not present

## 2017-10-04 MED ORDER — FLUTICASONE PROPIONATE 50 MCG/ACT NA SUSP: 2.0000 | Freq: Every day | NASAL | 0 refills | Status: DC

## 2017-10-04 MED ORDER — PREDNISONE 20 MG PO TABS: 40.0000 mg | ORAL_TABLET | Freq: Every day | ORAL | 0 refills | Status: AC

## 2017-10-04 MED ORDER — BUDESONIDE-FORMOTEROL FUMARATE 80-4.5 MCG/ACT IN AERO: 2.0000 | INHALATION_SPRAY | Freq: Two times a day (BID) | RESPIRATORY_TRACT | 0 refills | Status: DC

## 2017-10-04 NOTE — Discharge Instructions (Signed)
Push fluids to ensure adequate hydration and keep secretions thin.  Tylenol and/or ibuprofen as needed for pain or fevers.  Continue with albuterol as needed for wheezing. 5 days of prednisone. If symptoms worsen or do not improve in the next week to return to be seen or to follow up with PCP.

## 2017-10-04 NOTE — ED Triage Notes (Signed)
Per pt she is coughing, wheezing, nasal congestion

## 2017-10-04 NOTE — ED Provider Notes (Signed)
[facility]    CSN: [id number] Arrival date & time: 10/04/17  1216     History   Chief Complaint Chief Complaint  Patient presents with  . Cough  . Nasal Congestion  . Wheezing    HPI Sharon Nash is a 22 y.o. female.   Sharon Nash presents with complaints of cough congestion and chest pain which started 1 week ago. Fevers initially. Wheezing. Has been using albuterol inhaler which has helped, last used this morning. Ran out of symbicort and flonase. Mild sore throat and headache. Honey tea has helped. Daughter now also ill. Significant history for asthma. Has had to be hospitalized for this in the past.    ROS per HPI.       Past Medical History:  Diagnosis Date  . Asthma   . Chronic abdominal pain   . Chronic knee pain   . Respiratory failure, acute (HCC) 06/2012   bipap only, not intubated, due to asthma exacerbation    Patient Active Problem List   Diagnosis Date Noted  . Gastroesophageal reflux disease without esophagitis 01/27/2017  . Active labor at term 03/02/2016  . Rh negative state in antepartum period 02/26/2016  . 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 Supervision of normal pregnancy 09/13/2015  . Hyperemesis arising during pregnancy 08/30/2015  . Upper gastrointestinal bleeding 08/28/2015  . Status asthmaticus 07/06/2012  . Acute respiratory failure (HCC) 07/06/2012  . Asthma exacerbation 07/05/2012    Past Surgical History:  Procedure Laterality Date  . None      OB History    Gravida Para Term Preterm AB Living   2 2 2  0 0 2   SAB TAB Ectopic Multiple Live Births   0 0 0 0 2       Home Medications    Prior to Admission medications   Medication Sig Start Date End Date Taking? Authorizing Provider  albuterol (PROVENTIL HFA;VENTOLIN HFA) 108 (90 Base) MCG/ACT inhaler Inhale 1-2 puffs into the lungs every 6 (six) hours as needed for wheezing or shortness of breath. 06/29/17   Georgetta HaberBurky, Ardith Test B, NP  albuterol (PROVENTIL)  (2.5 MG/3ML) 0.083% nebulizer solution Take 3 mLs (2.5 mg total) by nebulization every 6 (six) hours as needed for wheezing or shortness of breath. 06/16/17   Zadie RhineWickline, Donald, MD  budesonide-formoterol (SYMBICORT) 80-4.5 MCG/ACT inhaler Inhale 2 puffs into the lungs 2 (two) times daily. To prevent wheezing 10/04/17   Georgetta HaberBurky, Chae Oommen B, NP  cetirizine (ZYRTEC ALLERGY) 10 MG tablet Take 1 tablet (10 mg total) by mouth daily. 01/24/17   Campbell RichesHoskins, Carolyn C, NP  fluticasone (FLONASE) 50 MCG/ACT nasal spray Place 2 sprays into both nostrils daily. 10/04/17   Georgetta HaberBurky, Hikaru Delorenzo B, NP  Guaifenesin 1200 MG TB12 Take 1 tablet (1,200 mg total) by mouth 2 (two) times daily. 01/22/17   Lawyer, Cristal Deerhristopher, PA-C  ipratropium-albuterol (DUONEB) 0.5-2.5 (3) MG/3ML SOLN Take 3 mLs by nebulization every 4 (four) hours as needed. 06/29/17   Georgetta HaberBurky, Kashmere Daywalt B, NP  methocarbamol (ROBAXIN) 500 MG tablet Take 1 tablet (500 mg total) by mouth at bedtime as needed for muscle spasms. 08/12/17   Georgetta HaberBurky, Justus Droke B, NP  naproxen (NAPROSYN) 500 MG tablet Take 1 po BID with food prn pain 06/15/17   Devoria AlbeKnapp, Iva, MD  pantoprazole (PROTONIX) 40 MG tablet Take 1 tablet (40 mg total) by mouth daily. Prn acid reflux 01/24/17   Campbell RichesHoskins, Carolyn C, NP  pantoprazole (PROTONIX) 40 MG tablet Take 40 mg by mouth daily.  [provider]  predniSONE (DELTASONE) 20 MG tablet Take 2 tablets (40 mg total) by mouth daily with breakfast for 5 days. 10/04/17 10/09/17  Georgetta Haber, NP  triamcinolone (NASACORT AQ) 55 MCG/ACT AERO nasal inhaler Place 2 sprays into the nose daily. 06/29/16   Muthersbaugh, Dahlia Client, PA-C    Family History Family History  Problem Relation Age of Onset  . Asthma Sister   . Diabetes Maternal Grandmother   . Hypertension Maternal Grandmother   . Diabetes Maternal Grandfather   . Asthma Maternal Grandfather   . Breast cancer Maternal Aunt   . Colon cancer Neg Hx     Social History Social History   Tobacco Use  .  Smoking status: Never Smoker  . Smokeless tobacco: Never Used  Substance Use Topics  . Alcohol use: No  . Drug use: No     Allergies   Shrimp [shellfish allergy]   Review of Systems Review of Systems   Physical Exam Triage Vital Signs ED Triage Vitals  Enc Vitals Group     BP 10/04/17 1323 121/73     Pulse Rate 10/04/17 1323 83     Resp --      Temp 10/04/17 1323 98.8 F (37.1 C)     Temp src --      SpO2 10/04/17 1323 100 %     Weight --      Height --      Head Circumference --      Peak Flow --      Pain Score 10/04/17 1321 7     Pain Loc --      Pain Edu? --      Excl. in GC? --    No data found.  Updated Vital Signs BP 121/73 (BP Location: Left Arm)   Pulse 83   Temp 98.8 F (37.1 C)   LMP 09/05/2017 (Approximate)   SpO2 100%   Visual Acuity Right Eye Distance:   Left Eye Distance:   Bilateral Distance:    Right Eye Near:   Left Eye Near:    Bilateral Near:     Physical Exam  Constitutional: She is oriented to person, place, and time. She appears well-developed and well-nourished. No distress.  HENT:  Head: Normocephalic and atraumatic.  Right Ear: Tympanic membrane, external ear and ear canal normal.  Left Ear: Tympanic membrane, external ear and ear canal normal.  Nose: Nose normal.  Mouth/Throat: Uvula is midline, oropharynx is clear and moist and mucous membranes are normal. No tonsillar exudate.  Eyes: Conjunctivae and EOM are normal. Pupils are equal, round, and reactive to light.  Cardiovascular: Normal rate, regular rhythm and normal heart sounds.  Pulmonary/Chest: Effort normal. She has wheezes.  Fine wheezes to upper lobes; bases clear  Neurological: She is alert and oriented to person, place, and time.  Skin: Skin is warm and dry.     UC Treatments / Results  Labs (all labs ordered are listed, but only abnormal results are displayed) Labs Reviewed - No data to display  EKG  EKG Interpretation None       Radiology No  results found.  Procedures Procedures (including critical care time)  Medications Ordered in UC Medications - No data to display   Initial Impression / Assessment and Plan / UC Course  I have reviewed the triage vital signs and the nursing notes.  Pertinent labs & imaging results that were available during my care of the patient were reviewed by me and  considered in my medical decision making (see chart for details).     Non toxic in appearance, without distress. Afebrile. Without tachypnea, tachycardia or hypoxia. RR 20 during exam. History and physical consistent with viral illness. 5 days of prednisone to further help with wheezing. Refilled symbicort and flonase. If symptoms worsen or do not improve in the next week to return to be seen or to follow up with PCP.  Patient verbalized understanding and agreeable to plan.     Final Clinical Impressions(s) / UC Diagnoses   Final diagnoses:  Viral URI with cough    ED Discharge Orders        Ordered    budesonide-formoterol (SYMBICORT) 80-4.5 MCG/ACT inhaler  2 times daily     10/04/17 1410    fluticasone (FLONASE) 50 MCG/ACT nasal spray  Daily     10/04/17 1410    predniSONE (DELTASONE) 20 MG tablet  Daily with breakfast     10/04/17 1410       Controlled Substance Prescriptions [place] Controlled Substance Registry consulted? Not Applicable   Georgetta Haber, NP 10/04/17 1414

## 2017-12-03 ENCOUNTER — Other Ambulatory Visit: Payer: Self-pay | Admitting: Advanced Practice Midwife

## 2017-12-19 ENCOUNTER — Other Ambulatory Visit: Payer: Self-pay | Admitting: Nurse Practitioner

## 2017-12-31 ENCOUNTER — Other Ambulatory Visit (HOSPITAL_COMMUNITY)
Admission: RE | Admit: 2017-12-31 | Discharge: 2017-12-31 | Disposition: A | Discharge status: Home / Self Care | Payer: Medicaid Other | Source: Ambulatory Visit | Attending: Advanced Practice Midwife | Admitting: Advanced Practice Midwife

## 2017-12-31 ENCOUNTER — Encounter (INDEPENDENT_AMBULATORY_CARE_PROVIDER_SITE_OTHER): Payer: Self-pay

## 2017-12-31 ENCOUNTER — Ambulatory Visit (INDEPENDENT_AMBULATORY_CARE_PROVIDER_SITE_OTHER): Payer: Medicaid Other | Admitting: Advanced Practice Midwife

## 2017-12-31 ENCOUNTER — Encounter: Payer: Self-pay | Admitting: Advanced Practice Midwife

## 2017-12-31 VITALS — BP 122/74 | HR 78 | Ht 68.0 in | Wt 157.0 lb

## 2017-12-31 DIAGNOSIS — Z0000 Encounter for general adult medical examination without abnormal findings: Secondary | ICD-10-CM | POA: Diagnosis not present

## 2017-12-31 DIAGNOSIS — Z113 Encounter for screening for infections with a predominantly sexual mode of transmission: Secondary | ICD-10-CM | POA: Insufficient documentation

## 2017-12-31 DIAGNOSIS — Z30013 Encounter for initial prescription of injectable contraceptive: Secondary | ICD-10-CM

## 2017-12-31 MED ORDER — HPV 9-VALENT RECOMB VACCINE IM SUSP: 0.5000 mL | Freq: Once | INTRAMUSCULAR | 2 refills | Status: AC

## 2017-12-31 MED ORDER — MEDROXYPROGESTERONE ACETATE 150 MG/ML IM SUSP: 150.0000 mg | INTRAMUSCULAR | 3 refills | Status: DC

## 2017-12-31 NOTE — Progress Notes (Signed)
Sharon Nash 22 y.o.  Vitals:   12/31/17 1325  BP: 122/74  Pulse: 78     Filed Weights   12/31/17 1325  Weight: 157 lb (71.2 kg)    Past Medical History: Past Medical History:  Diagnosis Date  . Asthma   . Chronic abdominal pain   . Chronic knee pain   . Respiratory failure, acute (HCC) 06/2012   bipap only, not intubated, due to asthma exacerbation    Past Surgical History: Past Surgical History:  Procedure Laterality Date  . None      Family History: Family History  Problem Relation Age of Onset  . Asthma Sister   . Diabetes Maternal Grandmother   . Hypertension Maternal Grandmother   . Diabetes Maternal Grandfather   . Asthma Maternal Grandfather   . Breast cancer Maternal Aunt   . Colon cancer Neg Hx     Social History: Social History   Tobacco Use  . Smoking status: Never Smoker  . Smokeless tobacco: Never Used  Substance Use Topics  . Alcohol use: No  . Drug use: No    Allergies:  Allergies  Allergen Reactions  . Shrimp [Shellfish Allergy] Anaphylaxis      Current Outpatient Medications:  .  albuterol (PROVENTIL HFA;VENTOLIN HFA) 108 (90 Base) MCG/ACT inhaler, Inhale 1-2 puffs into the lungs every 6 (six) hours as needed for wheezing or shortness of breath., Disp: 1 Inhaler, Rfl: 0 .  albuterol (PROVENTIL) (2.5 MG/3ML) 0.083% nebulizer solution, Take 3 mLs (2.5 mg total) by nebulization every 6 (six) hours as needed for wheezing or shortness of breath., Disp: 75 mL, Rfl: 12 .  budesonide-formoterol (SYMBICORT) 80-4.5 MCG/ACT inhaler, Inhale 2 puffs into the lungs 2 (two) times daily. To prevent wheezing, Disp: 1 Inhaler, Rfl: 0 .  cetirizine (ZYRTEC ALLERGY) 10 MG tablet, Take 1 tablet (10 mg total) by mouth daily., Disp: 30 tablet, Rfl: 2 .  fluticasone (FLONASE) 50 MCG/ACT nasal spray, Place 2 sprays into both nostrils daily., Disp: 16 g, Rfl: 0 .  Guaifenesin 1200 MG TB12, Take 1 tablet (1,200 mg total) by mouth 2 (two) times daily.  (Patient not taking: Reported on 12/31/2017), Disp: 20 each, Rfl: 0 .  ipratropium-albuterol (DUONEB) 0.5-2.5 (3) MG/3ML SOLN, Take 3 mLs by nebulization every 4 (four) hours as needed. (Patient not taking: Reported on 12/31/2017), Disp: 360 mL, Rfl: 0 .  methocarbamol (ROBAXIN) 500 MG tablet, Take 1 tablet (500 mg total) by mouth at bedtime as needed for muscle spasms. (Patient not taking: Reported on 12/31/2017), Disp: 5 tablet, Rfl: 0 .  naproxen (NAPROSYN) 500 MG tablet, Take 1 po BID with food prn pain (Patient not taking: Reported on 12/31/2017), Disp: 30 tablet, Rfl: 0 .  pantoprazole (PROTONIX) 40 MG tablet, Take 1 tablet (40 mg total) by mouth daily. Prn acid reflux (Patient not taking: Reported on 12/31/2017), Disp: 30 tablet, Rfl: 2 .  pantoprazole (PROTONIX) 40 MG tablet, Take 40 mg by mouth daily., Disp: , Rfl:  .  triamcinolone (NASACORT AQ) 55 MCG/ACT AERO nasal inhaler, Place 2 sprays into the nose daily. (Patient not taking: Reported on 12/31/2017), Disp: 1 Inhaler, Rfl: 12  History of Present Illness: Here for pap/BC.  Last pap 2017, will do today and keep 3 year schedule if normal. Periods come on at end of month; has new partner, wants depo. Also wants complete sTD screening.  Discussed gardisil, wants that too   Review of Systems   Patient denies any headaches, blurred vision,  shortness of breath, chest pain, abdominal pain, problems with bowel movements, urination, or intercourse.   Physical Exam: General:  Well developed, well nourished, no acute distress Skin:  Warm and dry Neck:  Midline trachea, normal thyroid Lungs; Clear to auscultation bilaterally Breast:  No dominant palpable mass, retraction, or nipple discharge Cardiovascular: Regular rate and rhythm Abdomen:  Soft, non tender, no hepatosplenomegaly Pelvic:  External genitalia is normal in appearance.  The vagina is normal in appearance.  The cervix is bulbous.  Uterus is felt to be normal size, shape, and contour.   No adnexal masses or tenderness noted.  Extremities:  No swelling or varicosities noted Psych:  No mood changes.     Impression: normal gyn exam Contra mgt gardisil     Plan: if pap normal, repeat in 3 years STD screening Call when period starts and will get depo (will be immediately effective) Start Gardisil w/depo

## 2017-12-31 NOTE — Patient Instructions (Signed)
HPV (Human Papillomavirus) Vaccine: What You Need to Know  1. Why get vaccinated?  HPV vaccine prevents infection with human papillomavirus (HPV) types that are associated with many cancers, including:  · cervical cancer in females,  · vaginal and vulvar cancers in females,  · anal cancer in females and males,  · throat cancer in females and males, and  · penile cancer in males.    In addition, HPV vaccine prevents infection with HPV types that cause genital warts in both females and males.  In the [place] about 12,000 women get cervical cancer every year, and about 4,000 women die from it. HPV vaccine can prevent most of these cases of cervical cancer.  Vaccination is not a substitute for cervical cancer screening. This vaccine does not protect against all HPV types that can cause cervical cancer. Women should still get regular Pap tests.  HPV infection usually comes from sexual contact, and most people will become infected at some point in their life. About 14 million Americans, including teens, get infected every year. Most infections will go away on their own and not cause serious problems. But thousands of women and men get cancer and other diseases from HPV.  2. HPV vaccine  HPV vaccine is approved by FDA and is recommended by CDC for both males and females. It is routinely given at 11 or 23 years of age, but it may be given beginning at age 9 years through age 26 years.  Most adolescents 9 through 23 years of age should get HPV vaccine as a two-dose series with the doses separated by 6-12 months. People who start HPV vaccination at 15 years of age and older should get the vaccine as a three-dose series with the second dose given 1-2 months after the first dose and the third dose given 6 months after the first dose. There are several exceptions to these age recommendations. Your health care provider can give you more information.  3. Some people should not get this vaccine   · Anyone who has had a severe (life-threatening) allergic reaction to a dose of HPV vaccine should not get another dose.  · Anyone who has a severe (life threatening) allergy to any component of HPV vaccine should not get the vaccine.  · Tell your doctor if you have any severe allergies that you know of, including a severe allergy to yeast.  · HPV vaccine is not recommended for pregnant women. If you learn that you were pregnant when you were vaccinated, there is no reason to expect any problems for you or your baby. Any woman who learns she was pregnant when she got HPV vaccine is encouraged to contact the manufacturer's registry for HPV vaccination during pregnancy at 1-800-986-8999. Women who are breastfeeding may be vaccinated.  · If you have a mild illness, such as a cold, you can probably get the vaccine today. If you are moderately or severely ill, you should probably wait until you recover. Your doctor can advise you.  4. Risks of a vaccine reaction  With any medicine, including vaccines, there is a chance of side effects. These are usually mild and go away on their own, but serious reactions are also possible.  Most people who get HPV vaccine do not have any serious problems with it.  Mild or moderate problems following HPV vaccine:  · Reactions in the arm where the shot was given:  ? Soreness (about 9 people in 10)  ? Redness or swelling (about 1 person   in 3)  · Fever:  ? Mild (100°F) (about 1 person in 10)  ? Moderate (102°F) (about 1 person in 65)  · Other problems:  ? Headache (about 1 person in 3)  Problems that could happen after any injected vaccine:  · People sometimes faint after a medical procedure, including vaccination. Sitting or lying down for about 15 minutes can help prevent fainting, and injuries caused by a fall. Tell your doctor if you feel dizzy, or have vision changes or ringing in the ears.  · Some people get severe pain in the shoulder and have difficulty moving  the arm where a shot was given. This happens very rarely.  · Any medication can cause a severe allergic reaction. Such reactions from a vaccine are very rare, estimated at about 1 in a million doses, and would happen within a few minutes to a few hours after the vaccination.  As with any medicine, there is a very remote chance of a vaccine causing a serious injury or death.  The safety of vaccines is always being monitored. For more information, visit: www.cdc.gov/vaccinesafety/.  5. What if there is a serious reaction?  What should I look for?  Look for anything that concerns you, such as signs of a severe allergic reaction, very high fever, or unusual behavior.  Signs of a severe allergic reaction can include hives, swelling of the face and throat, difficulty breathing, a fast heartbeat, dizziness, and weakness. These would usually start a few minutes to a few hours after the vaccination.  What should I do?  If you think it is a severe allergic reaction or other emergency that can't wait, call 9-1-1 or get to the nearest hospital. Otherwise, call your doctor.  Afterward, the reaction should be reported to the Vaccine Adverse Event Reporting System (VAERS). Your doctor should file this report, or you can do it yourself through the VAERS web site at www.vaers.hhs.gov, or by calling 1-800-822-7967.  VAERS does not give medical advice.  6. The National Vaccine Injury Compensation Program  The National Vaccine Injury Compensation Program (VICP) is a federal program that was created to compensate people who may have been injured by certain vaccines.  Persons who believe they may have been injured by a vaccine can learn about the program and about filing a claim by calling 1-800-338-2382 or visiting the VICP website at www.hrsa.gov/vaccinecompensation. There is a time limit to file a claim for compensation.  7. How can I learn more?  · Ask your health care provider. He or she can give you the vaccine  package insert or suggest other sources of information.  · Call your local or state health department.  · Contact the Centers for Disease Control and Prevention (CDC):  ? Call 1-800-232-4636 (1-800-CDC-INFO) or  ? Visit CDC’s website at www.cdc.gov/hpv  Vaccine Information Statement, HPV Vaccine (08/11/2015)  This information is not intended to replace advice given to you by your health care provider. Make sure you discuss any questions you have with your health care provider.  Document Released: 03/23/2014 Document Revised: 05/16/2016 Document Reviewed: 05/16/2016  Elsevier Interactive Patient Education © 2017 [employer]vier Inc.

## 2018-01-02 LAB — CYTOLOGY - PAP
Adequacy: ABSENT
Chlamydia: NEGATIVE
Diagnosis: NEGATIVE
NEISSERIA GONORRHEA: NEGATIVE

## 2018-01-05 ENCOUNTER — Ambulatory Visit: Payer: Medicaid Other

## 2018-01-06 ENCOUNTER — Ambulatory Visit: Payer: Medicaid Other

## 2018-03-07 ENCOUNTER — Emergency Department (HOSPITAL_COMMUNITY)
Admission: EM | Admit: 2018-03-07 | Discharge: 2018-03-07 | Disposition: A | Discharge status: Home / Self Care | Payer: Medicaid Other | Attending: Emergency Medicine | Admitting: Emergency Medicine

## 2018-03-07 DIAGNOSIS — K0889 Other specified disorders of teeth and supporting structures: Secondary | ICD-10-CM | POA: Diagnosis present

## 2018-03-07 DIAGNOSIS — Z79899 Other long term (current) drug therapy: Secondary | ICD-10-CM | POA: Diagnosis not present

## 2018-03-07 DIAGNOSIS — J45909 Unspecified asthma, uncomplicated: Secondary | ICD-10-CM | POA: Insufficient documentation

## 2018-03-07 DIAGNOSIS — K029 Dental caries, unspecified: Secondary | ICD-10-CM | POA: Insufficient documentation

## 2018-03-07 MED ORDER — CLINDAMYCIN HCL 150 MG PO CAPS: 150.0000 mg | ORAL_CAPSULE | Freq: Four times a day (QID) | ORAL | 0 refills | Status: DC

## 2018-03-07 MED ORDER — TRAMADOL HCL 50 MG PO TABS: 50.0000 mg | ORAL_TABLET | Freq: Four times a day (QID) | ORAL | 0 refills | Status: DC | PRN

## 2018-03-07 MED ORDER — TRAMADOL HCL 50 MG PO TABS
100.0000 mg | ORAL_TABLET | Freq: Once | ORAL | Status: AC
  Administered 2018-03-07: 100 mg via ORAL
  Filled 2018-03-07: qty 2

## 2018-03-07 MED ORDER — ONDANSETRON HCL 4 MG PO TABS
4.0000 mg | ORAL_TABLET | Freq: Once | ORAL | Status: AC
  Administered 2018-03-07: 4 mg via ORAL
  Filled 2018-03-07: qty 1

## 2018-03-07 MED ORDER — IBUPROFEN 600 MG PO TABS: 600.0000 mg | ORAL_TABLET | Freq: Four times a day (QID) | ORAL | 0 refills | Status: DC

## 2018-03-07 MED ORDER — CLINDAMYCIN HCL 150 MG PO CAPS
300.0000 mg | ORAL_CAPSULE | Freq: Once | ORAL | Status: AC
  Administered 2018-03-07: 300 mg via ORAL
  Filled 2018-03-07: qty 2

## 2018-03-07 MED ORDER — IBUPROFEN 400 MG PO TABS
400.0000 mg | ORAL_TABLET | Freq: Once | ORAL | Status: AC
  Administered 2018-03-07: 400 mg via ORAL
  Filled 2018-03-07: qty 1

## 2018-03-07 NOTE — ED Triage Notes (Signed)
Pt reports dental pain x 1-2 weeks. Pt took tylenol this morning. Reports pain has been moving around and causing neck and ear pain as well. Pt denies known dental problems, states she's has a cavity come out in the past.

## 2018-03-07 NOTE — ED Provider Notes (Signed)
[facility] EMERGENCY DEPARTMENT Provider Note   CSN: [id number] Arrival date & time: 03/07/18  [id number]     History   Chief Complaint Chief Complaint  Patient presents with  . Dental Pain    HPI Sharon Nash is a 22 y.o. female.  Patient is a 22 year old female who presents to the emergency department because of dental issues.  The patient states that she has been having problem with her teeth for about 2 weeks.  She now has pain on both sides of her face.  She has some pain with attempting attempting to open her mouth.  She is able to eat and able to speak, she has no problems with her breathing.  She has not measured her temperature for elevation at home.  She states that the dental pain seems to be getting worse and worse.  This is been present for a while, but she says is just gotten really bad within the last 2 weeks.  She presents now for assistance with this issue.     Past Medical History:  Diagnosis Date  . Asthma   . Chronic abdominal pain   . Chronic knee pain   . Respiratory failure, acute (HCC) 06/2012   bipap only, not intubated, due to asthma exacerbation    Patient Active Problem List   Diagnosis Date Noted  . Gastroesophageal reflux disease without esophagitis 01/27/2017  . 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 Upper gastrointestinal bleeding 08/28/2015  . Status asthmaticus 07/06/2012  . Acute respiratory failure (HCC) 07/06/2012  . Asthma exacerbation 07/05/2012    Past Surgical History:  Procedure Laterality Date  . None       OB History    Gravida  2   Para  2   Term  2   Preterm  0   AB  0   Living  2     SAB  0   TAB  0   Ectopic  0   Multiple  0   Live Births  2            Home Medications    Prior to Admission medications   Medication Sig Start Date End Date Taking? Authorizing Provider  acetaminophen (TYLENOL) 500 MG tablet Take 1,000 mg by mouth every 6 (six) hours as needed.   Yes [provider]   albuterol (PROVENTIL HFA;VENTOLIN HFA) 108 (90 Base) MCG/ACT inhaler Inhale 1-2 puffs into the lungs every 6 (six) hours as needed for wheezing or shortness of breath. 06/29/17  Yes Burky, Dorene GrebeNatalie B, NP  albuterol (PROVENTIL) (2.5 MG/3ML) 0.083% nebulizer solution Take 3 mLs (2.5 mg total) by nebulization every 6 (six) hours as needed for wheezing or shortness of breath. 06/16/17  Yes Zadie RhineWickline, Donald, MD  budesonide-formoterol [facility](SYMBICORT) 80-4.5 MCG/ACT inhaler Inhale 2 puffs into the lungs 2 (two) times daily. To prevent wheezing 10/04/17  Yes Georgetta HaberBurky, Natalie B, NP  cetirizine (ZYRTEC ALLERGY) 10 MG tablet Take 1 tablet (10 mg total) by mouth daily. 01/24/17  Yes Campbell RichesHoskins, Carolyn C, NP  fluticasone (FLONASE) 50 MCG/ACT nasal spray Place 2 sprays into both nostrils daily. 10/04/17  Yes Georgetta HaberBurky, Natalie B, NP  medroxyPROGESTERone (DEPO-PROVERA) 150 MG/ML injection Inject 1 mL (150 mg total) into the muscle every 3 (three) months. 12/31/17  Yes Cresenzo-Dishmon, Scarlette CalicoFrances, CNM    Family History Family History  Problem Relation Age of Onset  . Asthma Sister   . Diabetes Maternal Grandmother   . Hypertension Maternal Grandmother   . Diabetes  Maternal Grandfather   . Asthma Maternal Grandfather   . Breast cancer Maternal Aunt   . Colon cancer Neg Hx     Social History Social History   Tobacco Use  . Smoking status: Never Smoker  . Smokeless tobacco: Never Used  Substance Use Topics  . Alcohol use: No  . Drug use: No     Allergies   Shrimp [shellfish allergy]   Review of Systems Review of Systems  Constitutional: Negative for activity change.       All ROS Neg except as noted in HPI  HENT: Positive for dental problem. Negative for nosebleeds.        Facial pain  Eyes: Negative for photophobia and discharge.  Respiratory: Negative for cough, shortness of breath and wheezing.   Cardiovascular: Negative for chest pain and palpitations.  Gastrointestinal: Negative for abdominal pain and  blood in stool.  Genitourinary: Negative for dysuria, frequency and hematuria.  Musculoskeletal: Negative for arthralgias, back pain and neck pain.  Skin: Negative.   Neurological: Positive for headaches. Negative for dizziness, seizures and speech difficulty.  Psychiatric/Behavioral: Negative for confusion and hallucinations.     Physical Exam Updated Vital Signs BP 129/75 (BP Location: Right Arm)   Pulse 84   Temp 99 F (37.2 C) (Oral)   Resp 16   Ht 5\' 8"  (1.727 m)   Wt 71.2 kg (157 lb)   LMP 02/07/2018   SpO2 100%   BMI 23.87 kg/m   Physical Exam  Constitutional: She is oriented to person, place, and time. She appears well-developed and well-nourished.  Non-toxic appearance.  HENT:  Head: Normocephalic.  Right Ear: Tympanic membrane and external ear normal.  Left Ear: Tympanic membrane and external ear normal.  No hot areas about the face.  Patient has mild trismus present.  Patient able to mobilize secretions without any problem.  Speech is understandable.  The patient has cavities in the posterior molar area on the right and the left.  There is pain to percussion of the teeth on the top and the bottom.  Because of the patient's cooperation I cannot get a really good look at the upper posterior molar areas.  No swelling under the tongue.  Eyes: Pupils are equal, round, and reactive to light. EOM and lids are normal.  Neck: Normal range of motion. Neck supple. Carotid bruit is not present.  Cardiovascular: Normal rate, regular rhythm, normal heart sounds, intact distal pulses and normal pulses.  No murmur heard. Pulmonary/Chest: Breath sounds normal. No respiratory distress.  Abdominal: Soft. Bowel sounds are normal. There is no tenderness. There is no guarding.  Musculoskeletal: Normal range of motion.  Lymphadenopathy:       Head (right side): No submandibular adenopathy present.       Head (left side): No submandibular adenopathy present.    She has no cervical  adenopathy.  Neurological: She is alert and oriented to person, place, and time. She has normal strength. No cranial nerve deficit or sensory deficit. Coordination normal.  Skin: Skin is warm and dry. No rash noted.  Psychiatric: She has a normal mood and affect. Her speech is normal.  Nursing note and vitals reviewed.    ED Treatments / Results  Labs (all labs ordered are listed, but only abnormal results are displayed) Labs Reviewed - No data to display  EKG None  Radiology No results found.  Procedures Procedures (including critical care time)  Medications Ordered in ED Medications - No data to display  Initial Impression / Assessment and Plan / ED Course  I have reviewed the triage vital signs and the nursing notes.  Pertinent labs & imaging results that were available during my care of the patient were reviewed by me and considered in my medical decision making (see chart for details).       Final Clinical Impressions(s) / ED Diagnoses MDM  Vital signs reviewed.  Pulse oximetry is 100% on room air.  Within normal limits by my interpretation.  The patient has mild trismus noted.  The patient is able to speak, and is able to mobilize secretions without any problem whatsoever.  Patient has not had any high fever to be reported.  There is noted dental caries of the posterior molar areas.  I cannot see the upper posterior molar due to patient's inability to cooperate at this time.  The patient will be treated with clindamycin, ibuprofen, and Ultram.  The patient is strongly urged to see a dentist as soon as possible.  The patient will return to the emergency department for additional evaluation if not improving.   Final diagnoses:  Dental caries  Pain, dental    ED Discharge Orders        Ordered    clindamycin (CLEOCIN) 150 MG capsule  Every 6 hours     03/07/18 1113    traMADol (ULTRAM) 50 MG tablet  Every 6 hours PRN     03/07/18 1113    ibuprofen  (ADVIL,MOTRIN) 600 MG tablet  4 times daily     03/07/18 1113       Ivery Quale, PA-C 03/07/18 1116    Blane Ohara, MD 03/07/18 1512

## 2018-03-07 NOTE — Discharge Instructions (Addendum)
Your vital signs have been reviewed.  Your oxygen level is 100% on room air.  Within normal limits by my interpretation.  Your examination reveals dental cavities involving the posterior molars.  There is some swelling present.  You have some mild difficulty with opening and closing her mouth.  It is important that she see the dentist as soon as possible.  If this problem continues with your opening and closing her mouth, please return to the emergency department for additional evaluation.  Please use clindamycin and ibuprofen with breakfast, lunch, dinner, and at bedtime.  Use Ultram for more severe pain.  Ultram may cause drowsiness, as well as lightheadedness.  Please do not drive a vehicle, drink alcohol, operate machinery, or participate in activities requiring concentration when taking this medication.

## 2018-05-08 ENCOUNTER — Encounter (HOSPITAL_COMMUNITY): Payer: Self-pay | Admitting: Emergency Medicine

## 2018-05-08 ENCOUNTER — Emergency Department (HOSPITAL_COMMUNITY)
Admission: EM | Admit: 2018-05-08 | Discharge: 2018-05-08 | Disposition: A | Discharge status: Home / Self Care | Payer: Medicaid Other | Attending: Emergency Medicine | Admitting: Emergency Medicine

## 2018-05-08 ENCOUNTER — Other Ambulatory Visit: Payer: Self-pay

## 2018-05-08 DIAGNOSIS — Z79899 Other long term (current) drug therapy: Secondary | ICD-10-CM | POA: Insufficient documentation

## 2018-05-08 DIAGNOSIS — R51 Headache: Secondary | ICD-10-CM | POA: Insufficient documentation

## 2018-05-08 DIAGNOSIS — R630 Anorexia: Secondary | ICD-10-CM | POA: Insufficient documentation

## 2018-05-08 DIAGNOSIS — R519 Headache, unspecified: Secondary | ICD-10-CM

## 2018-05-08 DIAGNOSIS — J45909 Unspecified asthma, uncomplicated: Secondary | ICD-10-CM | POA: Diagnosis not present

## 2018-05-08 LAB — CBG MONITORING, ED: Glucose-Capillary: 89 mg/dL (ref 70–99)

## 2018-05-08 MED ORDER — DIPHENHYDRAMINE HCL 50 MG/ML IJ SOLN
25.0000 mg | Freq: Once | INTRAMUSCULAR | Status: AC
  Administered 2018-05-08: 25 mg via INTRAVENOUS
  Filled 2018-05-08: qty 1

## 2018-05-08 MED ORDER — METOCLOPRAMIDE HCL 5 MG/ML IJ SOLN
10.0000 mg | Freq: Once | INTRAMUSCULAR | Status: AC
  Administered 2018-05-08: 10 mg via INTRAVENOUS
  Filled 2018-05-08: qty 2

## 2018-05-08 MED ORDER — TRAMADOL HCL 50 MG PO TABS: 50.0000 mg | ORAL_TABLET | Freq: Four times a day (QID) | ORAL | 0 refills | Status: DC | PRN

## 2018-05-08 MED ORDER — KETOROLAC TROMETHAMINE 30 MG/ML IJ SOLN
30.0000 mg | Freq: Once | INTRAMUSCULAR | Status: AC
  Administered 2018-05-08: 30 mg via INTRAVENOUS
  Filled 2018-05-08: qty 1

## 2018-05-08 NOTE — ED Provider Notes (Signed)
[facility] EMERGENCY DEPARTMENT Provider Note   CSN: [id number] Arrival date & time: 05/08/18  7829     History   Chief Complaint Chief Complaint  Patient presents with  . Headache    HPI Sharon Nash is a 22 y.o. female.  Complains of a bad headache.  Some nausea  The history is provided by the patient. No language interpreter was used.  Headache   This is a new problem. The current episode started 12 to 24 hours ago. The problem occurs constantly. The problem has not changed since onset.The headache is associated with nothing. The pain is located in the frontal region. The quality of the pain is described as dull. The pain is at a severity of 7/10. The pain is moderate. The pain does not radiate. Associated symptoms include anorexia. She has tried nothing for the symptoms. The treatment provided no relief.    Past Medical History:  Diagnosis Date  . Asthma   . Chronic abdominal pain   . Chronic knee pain   . Respiratory failure, acute (HCC) 06/2012   bipap only, not intubated, due to asthma exacerbation    Patient Active Problem List   Diagnosis Date Noted  . Gastroesophageal reflux disease without esophagitis 01/27/2017  . 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 Upper gastrointestinal bleeding 08/28/2015  . Status asthmaticus 07/06/2012  . Acute respiratory failure (HCC) 07/06/2012  . Asthma exacerbation 07/05/2012    Past Surgical History:  Procedure Laterality Date  . None       OB History    Gravida  2   Para  2   Term  2   Preterm  0   AB  0   Living  2     SAB  0   TAB  0   Ectopic  0   Multiple  0   Live Births  2            Home Medications    Prior to Admission medications   Medication Sig Start Date End Date Taking? Authorizing Provider  acetaminophen (TYLENOL) 500 MG tablet Take 1,000 mg by mouth every 6 (six) hours as needed.   Yes [provider]  albuterol (PROVENTIL HFA;VENTOLIN HFA) 108 (90 Base)  MCG/ACT inhaler Inhale 1-2 puffs into the lungs every 6 (six) hours as needed for wheezing or shortness of breath. 06/29/17  Yes Burky, Dorene Grebe B, NP  albuterol (PROVENTIL) (2.5 MG/3ML) 0.083% nebulizer solution Take 3 mLs (2.5 mg total) by nebulization every 6 (six) hours as needed for wheezing or shortness of breath. 06/16/17  Yes Zadie Rhine, MD  budesonide-formoterol [facility]) 80-4.5 MCG/ACT inhaler Inhale 2 puffs into the lungs 2 (two) times daily. To prevent wheezing 10/04/17  Yes Georgetta Haber, NP  cetirizine (ZYRTEC ALLERGY) 10 MG tablet Take 1 tablet (10 mg total) by mouth daily. 01/24/17  Yes Campbell Riches, NP  fluticasone (FLONASE) 50 MCG/ACT nasal spray Place 2 sprays into both nostrils daily. 10/04/17  Yes Burky, Dorene Grebe B, NP  ibuprofen (ADVIL,MOTRIN) 600 MG tablet Take 1 tablet (600 mg total) by mouth 4 (four) times daily. 03/07/18  Yes Ivery Quale, PA-C  traMADol (ULTRAM) 50 MG tablet Take 1 tablet (50 mg total) by mouth every 6 (six) hours as needed. 05/08/18   Bethann Berkshire, MD    Family History Family History  Problem Relation Age of Onset  . Asthma Sister   . Diabetes Maternal Grandmother   . Hypertension Maternal Grandmother   .  Diabetes Maternal Grandfather   . Asthma Maternal Grandfather   . Breast cancer Maternal Aunt   . Colon cancer Neg Hx     Social History Social History   Tobacco Use  . Smoking status: Never Smoker  . Smokeless tobacco: Never Used  Substance Use Topics  . Alcohol use: No  . Drug use: No     Allergies   Shrimp [shellfish allergy]   Review of Systems Review of Systems  Constitutional: Negative for appetite change and fatigue.  HENT: Negative for congestion, ear discharge and sinus pressure.   Eyes: Negative for discharge.  Respiratory: Negative for cough.   Cardiovascular: Negative for chest pain.  Gastrointestinal: Positive for anorexia. Negative for abdominal pain and diarrhea.  Genitourinary: Negative for  frequency and hematuria.  Musculoskeletal: Negative for back pain.  Skin: Negative for rash.  Neurological: Positive for headaches. Negative for seizures.  Psychiatric/Behavioral: Negative for hallucinations.     Physical Exam Updated Vital Signs BP (!) 139/98 (BP Location: Right Arm)   Pulse 74   Temp 98.8 F (37.1 C) (Oral)   Resp 18   Ht 5\' 8"  (1.727 m)   Wt 71.2 kg   LMP 05/08/2018   SpO2 98%   BMI 23.87 kg/m   Physical Exam  Constitutional: She is oriented to person, place, and time. She appears well-developed.  HENT:  Head: Normocephalic.  Eyes: Conjunctivae and EOM are normal. No scleral icterus.  Neck: Neck supple. No thyromegaly present.  Cardiovascular: Normal rate and regular rhythm. Exam reveals no gallop and no friction rub.  No murmur heard. Pulmonary/Chest: No stridor. She has no wheezes. She has no rales. She exhibits no tenderness.  Abdominal: She exhibits no distension. There is no tenderness. There is no rebound.  Musculoskeletal: Normal range of motion. She exhibits no edema.  Lymphadenopathy:    She has no cervical adenopathy.  Neurological: She is oriented to person, place, and time. She exhibits normal muscle tone. Coordination normal.  Skin: No rash noted. No erythema.  Psychiatric: She has a normal mood and affect. Her behavior is normal.     ED Treatments / Results  Labs (all labs ordered are listed, but only abnormal results are displayed) Labs Reviewed  CBG MONITORING, ED    EKG None  Radiology No results found.  Procedures Procedures (including critical care time)  Medications Ordered in ED Medications  ketorolac (TORADOL) 30 MG/ML injection 30 mg (30 mg Intravenous Given 05/08/18 0841)  diphenhydrAMINE (BENADRYL) injection 25 mg (25 mg Intravenous Given 05/08/18 0841)  metoCLOPramide (REGLAN) injection 10 mg (10 mg Intravenous Given 05/08/18 0841)     Initial Impression / Assessment and Plan / ED Course  I have reviewed the  triage vital signs and the nursing notes.  Pertinent labs & imaging results that were available during my care of the patient were reviewed by me and considered in my medical decision making (see chart for details).     Patient with a bad headache.  She improved with migraine cocktail.  She will follow-up with her doctor and have her blood pressure rechecked  Final Clinical Impressions(s) / ED Diagnoses   Final diagnoses:  Bad headache    ED Discharge Orders         Ordered    traMADol (ULTRAM) 50 MG tablet  Every 6 hours PRN     05/08/18 1103           Bethann BerkshireZammit, Fatema Rabe, MD 05/08/18 1106

## 2018-05-08 NOTE — Discharge Instructions (Addendum)
Have your blood pressure checked in the next couple weeks.  Follow-up with any problems

## 2018-05-08 NOTE — ED Triage Notes (Signed)
Pt reports headache x2 days. Pt reports has taken otc tension medication and prescription migraine medication with no relief. Pt reports toothache x2 weeks as well. nad noted. Pt reports light and sound sensitivity.

## 2018-06-08 ENCOUNTER — Other Ambulatory Visit: Payer: Self-pay

## 2018-06-08 ENCOUNTER — Emergency Department (HOSPITAL_COMMUNITY)
Admission: EM | Admit: 2018-06-08 | Discharge: 2018-06-08 | Disposition: A | Discharge status: Home / Self Care | Payer: Medicaid Other | Attending: Emergency Medicine | Admitting: Emergency Medicine

## 2018-06-08 ENCOUNTER — Emergency Department (HOSPITAL_COMMUNITY): Admission: EM | Admit: 2018-06-08 | Discharge: 2018-06-08 | Discharge status: Absent without leave | Payer: Self-pay

## 2018-06-08 ENCOUNTER — Encounter (HOSPITAL_COMMUNITY): Payer: Self-pay | Admitting: Emergency Medicine

## 2018-06-08 DIAGNOSIS — K029 Dental caries, unspecified: Secondary | ICD-10-CM | POA: Diagnosis not present

## 2018-06-08 DIAGNOSIS — Z79899 Other long term (current) drug therapy: Secondary | ICD-10-CM | POA: Diagnosis not present

## 2018-06-08 DIAGNOSIS — K0889 Other specified disorders of teeth and supporting structures: Secondary | ICD-10-CM

## 2018-06-08 DIAGNOSIS — J45909 Unspecified asthma, uncomplicated: Secondary | ICD-10-CM | POA: Insufficient documentation

## 2018-06-08 MED ORDER — AMOXICILLIN 500 MG PO CAPS: 500.0000 mg | ORAL_CAPSULE | Freq: Three times a day (TID) | ORAL | 0 refills | Status: DC

## 2018-06-08 MED ORDER — IBUPROFEN 600 MG PO TABS: 600.0000 mg | ORAL_TABLET | Freq: Four times a day (QID) | ORAL | 0 refills | Status: DC

## 2018-06-08 MED ORDER — IBUPROFEN 400 MG PO TABS
400.0000 mg | ORAL_TABLET | Freq: Once | ORAL | Status: AC
  Administered 2018-06-08: 400 mg via ORAL
  Filled 2018-06-08: qty 1

## 2018-06-08 MED ORDER — AMOXICILLIN 250 MG PO CAPS
500.0000 mg | ORAL_CAPSULE | Freq: Once | ORAL | Status: AC
  Administered 2018-06-08: 500 mg via ORAL
  Filled 2018-06-08: qty 2

## 2018-06-08 MED ORDER — TRAMADOL HCL 50 MG PO TABS: 50.0000 mg | ORAL_TABLET | Freq: Four times a day (QID) | ORAL | 0 refills | Status: DC | PRN

## 2018-06-08 MED ORDER — TRAMADOL HCL 50 MG PO TABS
100.0000 mg | ORAL_TABLET | Freq: Once | ORAL | Status: AC
Start: 2018-06-08 — End: 2018-06-08
  Administered 2018-06-08: 100 mg via ORAL
  Filled 2018-06-08: qty 2

## 2018-06-08 MED ORDER — ONDANSETRON HCL 4 MG PO TABS
4.0000 mg | ORAL_TABLET | Freq: Once | ORAL | Status: AC
  Administered 2018-06-08: 4 mg via ORAL
  Filled 2018-06-08: qty 1

## 2018-06-08 NOTE — ED Triage Notes (Signed)
Toothaches bilateral lower cavities per pt. Pt states now having a headache from pain.

## 2018-06-08 NOTE — ED Notes (Signed)
Instructed pt to take all of antibiotics as prescribed.Pt verbalized understanding of no driving and to use caution within 4 hours of taking pain meds due to meds cause drowsiness 

## 2018-06-08 NOTE — ED Notes (Signed)
ED Provider at bedside. 

## 2018-06-08 NOTE — ED Provider Notes (Signed)
[facility] EMERGENCY DEPARTMENT Provider Note   CSN: [id number] Arrival date & time: 06/08/18  1549     History   Chief Complaint Chief Complaint  Patient presents with  . Dental Pain    HPI Sharon Nash is a 22 y.o. female.  The history is provided by the patient.  Dental Pain   This is a recurrent problem. The current episode started more than 2 days ago. The problem occurs daily. The problem has been gradually worsening. The pain is moderate. She has tried acetaminophen for the symptoms. The treatment provided no relief.    Past Medical History:  Diagnosis Date  . Asthma   . Chronic abdominal pain   . Chronic knee pain   . Respiratory failure, acute (HCC) 06/2012   bipap only, not intubated, due to asthma exacerbation    Patient Active Problem List   Diagnosis Date Noted  . Gastroesophageal reflux disease without esophagitis 01/27/2017  . 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 Upper gastrointestinal bleeding 08/28/2015  . Status asthmaticus 07/06/2012  . Acute respiratory failure (HCC) 07/06/2012  . Asthma exacerbation 07/05/2012    Past Surgical History:  Procedure Laterality Date  . None       OB History    Gravida  2   Para  2   Term  2   Preterm  0   AB  0   Living  2     SAB  0   TAB  0   Ectopic  0   Multiple  0   Live Births  2            Home Medications    Prior to Admission medications   Medication Sig Start Date End Date Taking? Authorizing Provider  acetaminophen (TYLENOL) 500 MG tablet Take 1,000 mg by mouth every 6 (six) hours as needed.    [provider]  albuterol (PROVENTIL HFA;VENTOLIN HFA) 108 (90 Base) MCG/ACT inhaler Inhale 1-2 puffs into the lungs every 6 (six) hours as needed for wheezing or shortness of breath. 06/29/17   Georgetta Haber, NP  albuterol (PROVENTIL) (2.5 MG/3ML) 0.083% nebulizer solution Take 3 mLs (2.5 mg total) by nebulization every 6 (six) hours as needed for wheezing or  shortness of breath. 06/16/17   Zadie Rhine, MD  budesonide-formoterol (SYMBICORT) 80-4.5 MCG/ACT inhaler Inhale 2 puffs into the lungs 2 (two) times daily. To prevent wheezing 10/04/17   Georgetta Haber, NP  cetirizine (ZYRTEC ALLERGY) 10 MG tablet Take 1 tablet (10 mg total) by mouth daily. 01/24/17   Campbell Riches, NP  fluticasone (FLONASE) 50 MCG/ACT nasal spray Place 2 sprays into both nostrils daily. 10/04/17   Georgetta Haber, NP  ibuprofen (ADVIL,MOTRIN) 600 MG tablet Take 1 tablet (600 mg total) by mouth 4 (four) times daily. 03/07/18   Ivery Quale, PA-C  traMADol (ULTRAM) 50 MG tablet Take 1 tablet (50 mg total) by mouth every 6 (six) hours as needed. 05/08/18   Bethann Berkshire, MD    Family History Family History  Problem Relation Age of Onset  . Asthma Sister   . Diabetes Maternal Grandmother   . Hypertension Maternal Grandmother   . Diabetes Maternal Grandfather   . Asthma Maternal Grandfather   . Breast cancer Maternal Aunt   . Colon cancer Neg Hx     Social History Social History   Tobacco Use  . Smoking status: Never Smoker  . Smokeless tobacco: Never Used  Substance Use Topics  .  Alcohol use: No  . Drug use: No     Allergies   Shrimp [shellfish allergy]   Review of Systems Review of Systems  Constitutional: Positive for fever. Negative for activity change.       All ROS Neg except as noted in HPI  HENT: Positive for dental problem. Negative for nosebleeds.   Eyes: Negative for photophobia and discharge.  Respiratory: Negative for cough, shortness of breath and wheezing.   Cardiovascular: Negative for chest pain and palpitations.  Gastrointestinal: Positive for nausea and vomiting. Negative for abdominal pain and blood in stool.  Genitourinary: Negative for dysuria, frequency and hematuria.  Musculoskeletal: Positive for neck pain. Negative for arthralgias and back pain.  Skin: Negative.   Neurological: Negative for dizziness, seizures and  speech difficulty.  Psychiatric/Behavioral: Negative for confusion and hallucinations.     Physical Exam Updated Vital Signs BP 132/87 (BP Location: Right Arm)   Pulse 80   Temp 98.3 F (36.8 C) (Oral)   Resp 16   Ht 5\' 4"  (1.626 m)   Wt 69.9 kg   LMP 06/06/2018   SpO2 100%   BMI 26.43 kg/m   Physical Exam  Constitutional: She is oriented to person, place, and time. She appears well-developed and well-nourished.  Non-toxic appearance.  HENT:  Head: Normocephalic.  Right Ear: Tympanic membrane and external ear normal.  Left Ear: Tympanic membrane and external ear normal.  Mouth/Throat: Uvula is midline. Dental caries present. No dental abscesses.    Eyes: Pupils are equal, round, and reactive to light. EOM and lids are normal.  Neck: Normal range of motion. Neck supple. Carotid bruit is not present.  Cardiovascular: Normal rate, regular rhythm, normal heart sounds, intact distal pulses and normal pulses.  Pulmonary/Chest: Breath sounds normal. No respiratory distress.  Abdominal: Soft. Bowel sounds are normal. There is no tenderness. There is no guarding.  Musculoskeletal: Normal range of motion.  Lymphadenopathy:       Head (right side): No submandibular adenopathy present.       Head (left side): No submandibular adenopathy present.    She has no cervical adenopathy.  Neurological: She is alert and oriented to person, place, and time. She has normal strength. No cranial nerve deficit or sensory deficit.  Skin: Skin is warm and dry.  Psychiatric: She has a normal mood and affect. Her speech is normal.  Nursing note and vitals reviewed.    ED Treatments / Results  Labs (all labs ordered are listed, but only abnormal results are displayed) Labs Reviewed - No data to display  EKG None  Radiology No results found.  Procedures Procedures (including critical care time)  Medications Ordered in ED Medications - No data to display   Initial Impression /  Assessment and Plan / ED Course  I have reviewed the triage vital signs and the nursing notes.  Pertinent labs & imaging results that were available during my care of the patient were reviewed by me and considered in my medical decision making (see chart for details).       Final Clinical Impressions(s) / ED DiagnosesMDM  Vital signs within normal limits.  Pulse oximetry is 100% on room air.  Within normal limits by my interpretation.  Patient has multiple areas of dental caries present.  There is no evidence for acute Ludewig's angina.  The airway is patent.  The patient will be treated with Amoxil, ibuprofen, and Ultram.  The patient is given information on dental resources.  I have instructed patient  that she needs to be evaluated by dental specialist as soon as possible.  Patient is in agreement with this plan.    Final diagnoses:  Dental caries  Pain, dental    ED Discharge Orders         Ordered    amoxicillin (AMOXIL) 500 MG capsule  3 times daily     06/08/18 1745    ibuprofen (ADVIL,MOTRIN) 600 MG tablet  4 times daily     06/08/18 1745    traMADol (ULTRAM) 50 MG tablet  Every 6 hours PRN     06/08/18 1745           Ivery Quale, PA-C 06/08/18 1754    Benjiman Core, MD 06/09/18 0001

## 2018-06-08 NOTE — Discharge Instructions (Addendum)
Your vital signs have been reviewed.  Your oxygen level is 100% on room air.  It is important that you are evaluated and treated by a dentist as soon as possible to prevent the current infection from becoming more serious.  I have included dental resources in your discharge instructions, have also included information on the [facility] clinic.  Please use Amoxil 3 times daily with food.  Please use ibuprofen with breakfast, lunch, dinner, and at bedtime.  May use Ultram for more severe pain.` This medication may cause drowsiness. Please do not drink, drive, or participate in activity that requires concentration while taking this medication.

## 2018-08-31 ENCOUNTER — Other Ambulatory Visit: Payer: Self-pay

## 2018-08-31 ENCOUNTER — Encounter (HOSPITAL_COMMUNITY): Payer: Self-pay | Admitting: Emergency Medicine

## 2018-08-31 ENCOUNTER — Ambulatory Visit (HOSPITAL_COMMUNITY)
Admission: EM | Admit: 2018-08-31 | Discharge: 2018-08-31 | Disposition: A | Discharge status: Home / Self Care | Payer: Medicaid Other | Attending: Family Medicine | Admitting: Family Medicine

## 2018-08-31 DIAGNOSIS — J111 Influenza due to unidentified influenza virus with other respiratory manifestations: Secondary | ICD-10-CM

## 2018-08-31 DIAGNOSIS — J4541 Moderate persistent asthma with (acute) exacerbation: Secondary | ICD-10-CM | POA: Insufficient documentation

## 2018-08-31 DIAGNOSIS — R69 Illness, unspecified: Secondary | ICD-10-CM | POA: Insufficient documentation

## 2018-08-31 MED ORDER — ALBUTEROL SULFATE (2.5 MG/3ML) 0.083% IN NEBU: 2.5000 mg | INHALATION_SOLUTION | Freq: Four times a day (QID) | RESPIRATORY_TRACT | 1 refills | Status: DC | PRN

## 2018-08-31 MED ORDER — ALBUTEROL SULFATE HFA 108 (90 BASE) MCG/ACT IN AERS: 1.0000 | INHALATION_SPRAY | Freq: Four times a day (QID) | RESPIRATORY_TRACT | 1 refills | Status: DC | PRN

## 2018-08-31 MED ORDER — PREDNISONE 10 MG (21) PO TBPK: ORAL_TABLET | Freq: Every day | ORAL | 0 refills | Status: DC

## 2018-08-31 NOTE — ED Provider Notes (Signed)
[facility]   [id number] 08/31/18 Arrival Time: 1106  ASSESSMENT & PLAN:  1. Moderate persistent asthma with exacerbation   2. Influenza-like illness    No indication for chest imaging at this time or hospital evaluation/admission. Discussed.  Meds ordered this encounter  Medications  . albuterol (PROVENTIL HFA;VENTOLIN HFA) 108 (90 Base) MCG/ACT inhaler    Sig: Inhale 1-2 puffs into the lungs every 6 (six) hours as needed for wheezing or shortness of breath.    Dispense:  1 Inhaler    Refill:  1  . albuterol (PROVENTIL) (2.5 MG/3ML) 0.083% nebulizer solution    Sig: Take 3 mLs (2.5 mg total) by nebulization every 6 (six) hours as needed for wheezing or shortness of breath.    Dispense:  75 mL    Refill:  1  . predniSONE (STERAPRED UNI-PAK 21 TAB) 10 MG (21) TBPK tablet    Sig: Take by mouth daily. Take as directed.    Dispense:  21 tablet    Refill:  0   Asthma precautions given.  Follow-up Information    [facility].   Specialty:  Emergency Medicine Why:  If symptoms worsen. Contact information: [address] [id number] [place] [id number] [phone]         OTC symptom care as needed. Discussed typical duration of flu-like illness. Work note given.  Reviewed expectations re: course of current medical issues. Questions answered. Outlined signs and symptoms indicating need for more acute intervention. Patient verbalized understanding. After Visit Summary given.  SUBJECTIVE: History from: patient.  Sharon Nash is a 22 y.o. female who presents with complaint of fairly persistent chest tightness and wheezing; gradual onset over the past 2-3 days Triggers: questions recent cold symptoms; reports body aches, chills, nasal congestion that started about 4 days ago. Describes wheezing as moderate to severe when present. Home nebulizer and MDI with only brief help. No specific CP  reported. Fever: no. Overall normal PO intake without n/v. Sick contacts: none known. Ambulatory without difficulty. Typically her asthma is well controlled. Inhaler use: very frequent over the past few days; needs refill in addition to albuterol neb sol'n. OTC treatment: none.  Received flu shot this year: no.  Social History   Tobacco Use  Smoking Status Never Smoker  Smokeless Tobacco Never Used   ROS: As per HPI. All other systems negative.    OBJECTIVE: Vitals not entered into computer. From monitor in room: BP 130/84; P 74; RR 14 SpO2: 96% on RA.  General appearance: alert; appears fatigued HEENT: nasal congestion; clear runny nose; throat irritation secondary to post-nasal drainage Neck: supple without LAD Cv: RRR without murmer Lungs: no significant labored respirations, moderate bilateral expiratory wheezing; cough: mild; no significant respiratory distress; able to speak in short sentences Skin: warm and dry Ext: no LE edema Psychological: alert and cooperative; normal mood and affect   Allergies  Allergen Reactions  . Shrimp [Shellfish Allergy] Anaphylaxis    Past Medical History:  Diagnosis Date  . Asthma   . Chronic abdominal pain   . Chronic knee pain   . Respiratory failure, acute (HCC) 06/2012   bipap only, not intubated, due to asthma exacerbation   Family History  Problem Relation Age of Onset  . Asthma Sister   . Diabetes Maternal Grandmother   . Hypertension Maternal Grandmother   . Diabetes Maternal Grandfather   . Asthma Maternal Grandfather   . Breast cancer Maternal Aunt   . Colon  cancer Neg Hx    Social History   Socioeconomic History  . Marital status: Single    Spouse name: Not on file  . Number of children: Not on file  . Years of education: Not on file  . Highest education level: Not on file  Occupational History  . Not on file  Social Needs  . Financial resource strain: Not on file  . Food insecurity:    Worry: Not on  file    Inability: Not on file  . Transportation needs:    Medical: Not on file    Non-medical: Not on file  Tobacco Use  . Smoking status: Never Smoker  . Smokeless tobacco: Never Used  Substance and Sexual Activity  . Alcohol use: No  . Drug use: No  . Sexual activity: Yes    Birth control/protection: Condom  Lifestyle  . Physical activity:    Days per week: Not on file    Minutes per session: Not on file  . Stress: Not on file  Relationships  . Social connections:    Talks on phone: Not on file    Gets together: Not on file    Attends religious service: Not on file    Active member of club or organization: Not on file    Attends meetings of clubs or organizations: Not on file    Relationship status: Not on file  . Intimate partner violence:    Fear of current or ex partner: Not on file    Emotionally abused: Not on file    Physically abused: Not on file    Forced sexual activity: Not on file  Other Topics Concern  . Not on file  Social History Narrative  . Not on file            Mardella LaymanHagler, Keynan Heffern, MD 09/05/18 [phone]

## 2018-08-31 NOTE — Discharge Instructions (Signed)

## 2018-08-31 NOTE — ED Triage Notes (Signed)
Pt reports issues with her asthma  That isnt being relieved by her inhaler and body aches that started on Friday.

## 2018-11-12 ENCOUNTER — Encounter (HOSPITAL_COMMUNITY): Payer: Self-pay | Admitting: Emergency Medicine

## 2018-11-12 ENCOUNTER — Other Ambulatory Visit: Payer: Self-pay

## 2018-11-12 ENCOUNTER — Emergency Department (HOSPITAL_COMMUNITY)
Admission: EM | Admit: 2018-11-12 | Discharge: 2018-11-12 | Disposition: A | Discharge status: Home / Self Care | Payer: Medicaid Other | Attending: Emergency Medicine | Admitting: Emergency Medicine

## 2018-11-12 DIAGNOSIS — R197 Diarrhea, unspecified: Secondary | ICD-10-CM | POA: Insufficient documentation

## 2018-11-12 DIAGNOSIS — R112 Nausea with vomiting, unspecified: Secondary | ICD-10-CM | POA: Insufficient documentation

## 2018-11-12 DIAGNOSIS — J45909 Unspecified asthma, uncomplicated: Secondary | ICD-10-CM | POA: Insufficient documentation

## 2018-11-12 LAB — URINALYSIS, ROUTINE W REFLEX MICROSCOPIC
Bilirubin Urine: NEGATIVE
GLUCOSE, UA: NEGATIVE mg/dL
Ketones, ur: NEGATIVE mg/dL
Leukocytes,Ua: NEGATIVE
NITRITE: NEGATIVE
PH: 5 (ref 5.0–8.0)
PROTEIN: NEGATIVE mg/dL
SPECIFIC GRAVITY, URINE: 1.017 (ref 1.005–1.030)
Squamous Epithelial / LPF: >50 — ABNORMAL HIGH (ref 0–5)

## 2018-11-12 LAB — COMPREHENSIVE METABOLIC PANEL
ALK PHOS: 49 U/L (ref 38–126)
ALT: 10 U/L (ref 0–44)
ANION GAP: 6 (ref 5–15)
AST: 16 U/L (ref 15–41)
Albumin: 3.9 g/dL (ref 3.5–5.0)
BUN: 20 mg/dL (ref 6–20)
CALCIUM: 8.8 mg/dL — AB (ref 8.9–10.3)
CO2: 23 mmol/L (ref 22–32)
CREATININE: 0.64 mg/dL (ref 0.44–1.00)
Chloride: 109 mmol/L (ref 98–111)
Glucose, Bld: 91 mg/dL (ref 70–99)
Potassium: 3.8 mmol/L (ref 3.5–5.1)
Sodium: 138 mmol/L (ref 135–145)
TOTAL PROTEIN: 7.3 g/dL (ref 6.5–8.1)
Total Bilirubin: 0.7 mg/dL (ref 0.3–1.2)

## 2018-11-12 LAB — CBC WITH DIFFERENTIAL/PLATELET
ABS IMMATURE GRANULOCYTES: 0.01 10*3/uL (ref 0.00–0.07)
BASOS PCT: 0 %
Basophils Absolute: 0 10*3/uL (ref 0.0–0.1)
EOS PCT: 6 %
Eosinophils Absolute: 0.2 10*3/uL (ref 0.0–0.5)
HCT: 39.3 % (ref 36.0–46.0)
HEMOGLOBIN: 12 g/dL (ref 12.0–15.0)
Immature Granulocytes: 0 %
LYMPHS PCT: 27 %
Lymphs Abs: 1.1 10*3/uL (ref 0.7–4.0)
MCH: 28.2 pg (ref 26.0–34.0)
MCHC: 30.5 g/dL (ref 30.0–36.0)
MCV: 92.3 fL (ref 80.0–100.0)
MONO ABS: 0.2 10*3/uL (ref 0.1–1.0)
MONOS PCT: 5 %
NEUTROS ABS: 2.5 10*3/uL (ref 1.7–7.7)
Neutrophils Relative %: 62 %
Platelets: 296 10*3/uL (ref 150–400)
RBC: 4.26 MIL/uL (ref 3.87–5.11)
RDW: 12.9 % (ref 11.5–15.5)
WBC: 4 10*3/uL (ref 4.0–10.5)
nRBC: 0 % (ref 0.0–0.2)

## 2018-11-12 LAB — PREGNANCY, URINE: Preg Test, Ur: NEGATIVE

## 2018-11-12 MED ORDER — SODIUM CHLORIDE 0.9 % IV BOLUS
1000.0000 mL | Freq: Once | INTRAVENOUS | Status: AC
  Administered 2018-11-12: 1000 mL via INTRAVENOUS

## 2018-11-12 MED ORDER — ONDANSETRON 4 MG PO TBDP: 4.0000 mg | ORAL_TABLET | Freq: Three times a day (TID) | ORAL | 0 refills | Status: DC | PRN

## 2018-11-12 MED ORDER — ONDANSETRON HCL 4 MG/2ML IJ SOLN
4.0000 mg | Freq: Once | INTRAMUSCULAR | Status: AC
  Administered 2018-11-12: 4 mg via INTRAVENOUS
  Filled 2018-11-12: qty 2

## 2018-11-12 NOTE — ED Triage Notes (Signed)
Pt c/o n/v/d and intermittent generalized abdominal cramping that began this morning. Pt states her children had same symptoms yesterday. Denies fever.

## 2018-11-12 NOTE — Discharge Instructions (Signed)
Return if any problems.

## 2018-11-12 NOTE — ED Notes (Signed)
Pt would like to leave because she has to go pick up her child. States she feels much better.

## 2018-11-12 NOTE — ED Provider Notes (Signed)
[facility] EMERGENCY DEPARTMENT Provider Note   CSN: [id number] Arrival date & time: 11/12/18  4765    History   Chief Complaint Chief Complaint  Patient presents with  . Emesis    HPI Sharon Nash is a 23 y.o. female.     The history is provided by the patient.  Emesis  Severity:  Moderate Timing:  Constant Number of daily episodes:  Multiple Progression:  Worsening Chronicity:  New Recent urination:  Normal Relieved by:  Nothing Worsened by:  Nothing Ineffective treatments:  None tried Associated symptoms: abdominal pain   Risk factors: sick contacts   Pt reports her children have had the same thing.   Past Medical History:  Diagnosis Date  . Asthma   . Chronic abdominal pain   . Chronic knee pain   . Respiratory failure, acute (HCC) 06/2012   bipap only, not intubated, due to asthma exacerbation    Patient Active Problem List   Diagnosis Date Noted  . Gastroesophageal reflux disease without esophagitis 01/27/2017  . 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 Upper gastrointestinal bleeding 08/28/2015  . Status asthmaticus 07/06/2012  . Acute respiratory failure (HCC) 07/06/2012  . Asthma exacerbation 07/05/2012    Past Surgical History:  Procedure Laterality Date  . None       OB History    Gravida  2   Para  2   Term  2   Preterm  0   AB  0   Living  2     SAB  0   TAB  0   Ectopic  0   Multiple  0   Live Births  2            Home Medications    Prior to Admission medications   Medication Sig Start Date End Date Taking? Authorizing Provider  acetaminophen (TYLENOL) 500 MG tablet Take 1,000 mg by mouth every 6 (six) hours as needed.    [provider]  albuterol (PROVENTIL HFA;VENTOLIN HFA) 108 (90 Base) MCG/ACT inhaler Inhale 1-2 puffs into the lungs every 6 (six) hours as needed for wheezing or shortness of breath. 08/31/18   Mardella Layman, MD  albuterol (PROVENTIL) (2.5 MG/3ML) 0.083% nebulizer solution Take  3 mLs (2.5 mg total) by nebulization every 6 (six) hours as needed for wheezing or shortness of breath. 08/31/18   Mardella Layman, MD  budesonide-formoterol (SYMBICORT) 80-4.5 MCG/ACT inhaler Inhale 2 puffs into the lungs 2 (two) times daily. To prevent wheezing 10/04/17   Georgetta Haber, NP  cetirizine (ZYRTEC ALLERGY) 10 MG tablet Take 1 tablet (10 mg total) by mouth daily. 01/24/17   Campbell Riches, NP  fluticasone (FLONASE) 50 MCG/ACT nasal spray Place 2 sprays into both nostrils daily. 10/04/17   Georgetta Haber, NP  ibuprofen (ADVIL,MOTRIN) 600 MG tablet Take 1 tablet (600 mg total) by mouth 4 (four) times daily. 06/08/18   Ivery Quale, PA-C  ondansetron (ZOFRAN ODT) 4 MG disintegrating tablet Take 1 tablet (4 mg total) by mouth every 8 (eight) hours as needed for nausea or vomiting. 11/12/18   Elson Areas, PA-C  predniSONE (STERAPRED UNI-PAK 21 TAB) 10 MG (21) TBPK tablet Take by mouth daily. Take as directed. 08/31/18   Mardella Layman, MD  traMADol (ULTRAM) 50 MG tablet Take 1 tablet (50 mg total) by mouth every 6 (six) hours as needed. 06/08/18   Ivery Quale, PA-C    Family History Family History  Problem Relation Age of Onset  .  Asthma Sister   . Diabetes Maternal Grandmother   . Hypertension Maternal Grandmother   . Diabetes Maternal Grandfather   . Asthma Maternal Grandfather   . Breast cancer Maternal Aunt   . Colon cancer Neg Hx     Social History Social History   Tobacco Use  . Smoking status: Never Smoker  . Smokeless tobacco: Never Used  Substance Use Topics  . Alcohol use: No  . Drug use: No     Allergies   Shrimp [shellfish allergy]   Review of Systems Review of Systems  Gastrointestinal: Positive for abdominal pain and vomiting.  All other systems reviewed and are negative.    Physical Exam Updated Vital Signs BP (!) 113/52   Pulse 79   Temp 98.5 F (36.9 C) (Oral)   Resp 11   Ht  (1.727 m)   Wt 72.6 kg   LMP 11/01/2018  (Approximate)   SpO2 92%   BMI 24.33 kg/m   Physical Exam Vitals signs and nursing note reviewed.  Constitutional:      Appearance: She is well-developed.  HENT:     Head: Normocephalic.     Right Ear: Tympanic membrane normal.     Left Ear: Tympanic membrane normal.     Nose: Nose normal.  Neck:     Musculoskeletal: Normal range of motion.  Pulmonary:     Effort: Pulmonary effort is normal.  Abdominal:     General: Abdomen is flat. There is no distension.     Palpations: There is no mass.     Tenderness: There is no abdominal tenderness.  Musculoskeletal: Normal range of motion.  Skin:    General: Skin is warm.  Neurological:     Mental Status: She is alert and oriented to person, place, and time.      ED Treatments / Results  Labs (all labs ordered are listed, but only abnormal results are displayed) Labs Reviewed  COMPREHENSIVE METABOLIC PANEL - Abnormal; Notable for the following components:      Result Value   Calcium 8.8 (*)    All other components within normal limits  URINALYSIS, ROUTINE W REFLEX MICROSCOPIC - Abnormal; Notable for the following components:   APPearance CLOUDY (*)    Hgb urine dipstick SMALL (*)    Bacteria, UA RARE (*)    Squamous Epithelial / LPF >50 (*)    All other components within normal limits  CBC WITH DIFFERENTIAL/PLATELET  PREGNANCY, URINE    EKG None  Radiology No results found.  Procedures Procedures (including critical care time)  Medications Ordered in ED Medications  sodium chloride 0.9 % bolus 1,000 mL (0 mLs Intravenous Stopped 11/12/18 1209)  ondansetron (ZOFRAN) injection 4 mg (4 mg Intravenous Given 11/12/18 1057)     Initial Impression / Assessment and Plan / ED Course  I have reviewed the triage vital signs and the nursing notes.  Pertinent labs & imaging results that were available during my care of the patient were reviewed by me and considered in my medical decision making (see chart for details).         MDM  Pt given Iv fluids and zofran.  Pt able to tolerate fluids.  Pt given rx for zofran    Final Clinical Impressions(s) / ED Diagnoses   Final diagnoses:  Nausea vomiting and diarrhea    ED Discharge Orders         Ordered    ondansetron (ZOFRAN ODT) 4 MG disintegrating tablet  Every 8  hours PRN     11/12/18 1217        An After Visit Summary was printed and given to the patient.    Elson Areas, PA-C 11/12/18 2020    Vanetta Mulders, MD 11/14/18 [phone]

## 2020-02-10 ENCOUNTER — Ambulatory Visit
Admission: EM | Admit: 2020-02-10 | Discharge: 2020-02-10 | Disposition: A | Discharge status: Home / Self Care | Payer: Self-pay | Attending: Emergency Medicine | Admitting: Emergency Medicine

## 2020-02-10 ENCOUNTER — Other Ambulatory Visit: Payer: Self-pay

## 2020-02-10 DIAGNOSIS — J4521 Mild intermittent asthma with (acute) exacerbation: Secondary | ICD-10-CM

## 2020-02-10 MED ORDER — DEXAMETHASONE SODIUM PHOSPHATE 10 MG/ML IJ SOLN
10.0000 mg | Freq: Once | INTRAMUSCULAR | Status: AC
  Administered 2020-02-10: 10 mg via INTRAMUSCULAR

## 2020-02-10 MED ORDER — PREDNISONE 10 MG (21) PO TBPK: ORAL_TABLET | Freq: Every day | ORAL | 0 refills | Status: DC

## 2020-02-10 MED ORDER — ALBUTEROL SULFATE (2.5 MG/3ML) 0.083% IN NEBU: 2.5000 mg | INHALATION_SOLUTION | Freq: Four times a day (QID) | RESPIRATORY_TRACT | 12 refills | Status: DC | PRN

## 2020-02-10 NOTE — ED Triage Notes (Signed)
Pt presents with asthma flare that began yesterday, ran out of inhaler

## 2020-02-10 NOTE — Discharge Instructions (Signed)
Inhaler given in office Steroid shot given in office Take steroid as prescribed and to completion Albuterol solution prescribed.  Use nebulizer as directed.   Follow up with PCP next week Return here or go to ER if you have any new or worsening symptoms such as shortness of breath, difficulty breathing, accessory muscle use, rib retraction, or if symptoms do not improve with medication

## 2020-02-10 NOTE — ED Provider Notes (Signed)
[facility]   [id number] 02/10/20 Arrival Time: 4098   JX:[id number]  SUBJECTIVE: History from: patient.  KASSY MCENROE is a 24 y.o. female who presents with complaint of intermittent chest tightness, dyspnea, productive cough with clear sputum and wheezing. Triggers: change in weather and performing yardwork. Onset abrupt, approximately 1 day ago. Describes wheezing as mild when present. Fever: no. Overall normal PO intake without n/v. Sick contacts: no.  Typically her asthma is well controlled. Denies fever, chills, nausea, vomiting, SOB, chest pain, abdominal pain, changes in bowel or bladder function.   Inhaler use: albuterol inhaler, ran out this morning  ROS: As per HPI.  All other pertinent ROS negative.    Past Medical History:  Diagnosis Date  . Asthma   . Chronic abdominal pain   . Chronic knee pain   . Respiratory failure, acute (HCC) 06/2012   bipap only, not intubated, due to asthma exacerbation   Past Surgical History:  Procedure Laterality Date  . None     Allergies  Allergen Reactions  . Shrimp [Shellfish Allergy] Anaphylaxis   No current facility-administered medications on file prior to encounter.   Current Outpatient Medications on File Prior to Encounter  Medication Sig Dispense Refill  . acetaminophen (TYLENOL) 500 MG tablet Take 1,000 mg by mouth every 6 (six) hours as needed.    . budesonide-formoterol (SYMBICORT) 80-4.5 MCG/ACT inhaler Inhale 2 puffs into the lungs 2 (two) times daily. To prevent wheezing 1 Inhaler 0  . ibuprofen (ADVIL,MOTRIN) 600 MG tablet Take 1 tablet (600 mg total) by mouth 4 (four) times daily. 30 tablet 0  . [DISCONTINUED] cetirizine (ZYRTEC ALLERGY) 10 MG tablet Take 1 tablet (10 mg total) by mouth daily. 30 tablet 2  . [DISCONTINUED] fluticasone (FLONASE) 50 MCG/ACT nasal spray Place 2 sprays into both nostrils daily. 16 g 0   Social History   Socioeconomic History  . Marital status: Single    Spouse name: Not  on file  . Number of children: Not on file  . Years of education: Not on file  . Highest education level: Not on file  Occupational History  . Not on file  Tobacco Use  . Smoking status: Never Smoker  . Smokeless tobacco: Never Used  Substance and Sexual Activity  . Alcohol use: No  . Drug use: No  . Sexual activity: Yes    Birth control/protection: Condom  Other Topics Concern  . Not on file  Social History Narrative  . Not on file   Social Determinants of Health   Financial Resource Strain:   . Difficulty of Paying Living Expenses:   Food Insecurity:   . Worried About Programme researcher, broadcasting/film/video in the Last Year:   . Barista in the Last Year:   Transportation Needs:   . Freight forwarder (Medical):   Marland Kitchen Lack of Transportation (Non-Medical):   Physical Activity:   . Days of Exercise per Week:   . Minutes of Exercise per Session:   Stress:   . Feeling of Stress :   Social Connections:   . Frequency of Communication with Friends and Family:   . Frequency of Social Gatherings with Friends and Family:   . Attends Religious Services:   . Active Member of Clubs or Organizations:   . Attends Banker Meetings:   Marland Kitchen Marital Status:   Intimate Partner Violence:   . Fear of Current or Ex-Partner:   . Emotionally Abused:   Marland Kitchen Physically Abused:   .  Sexually Abused:    Family History  Problem Relation Age of Onset  . Asthma Sister   . Diabetes Maternal Grandmother   . Hypertension Maternal Grandmother   . Diabetes Maternal Grandfather   . Asthma Maternal Grandfather   . Breast cancer Maternal Aunt   . Colon cancer Neg Hx     OBJECTIVE:  Vitals:   02/10/20 0838  BP: 135/70  Pulse: 78  Resp: (!) 22  Temp: 98.5 F (36.9 C)  SpO2: 94%     General appearance: alert; appears fatigued HEENT: nasal congestion; clear runny nose; throat irritation secondary to post-nasal drainage Neck: supple without LAD Lungs: mild labored respirations, speaking in  mostly full sentences, no wheezing, decreased breath sounds throughout; cough: absent; no significant respiratory distress Skin: warm and dry Psychological: alert and cooperative; normal mood and affect   ASSESSMENT & PLAN:  1. Mild intermittent asthma with acute exacerbation      Meds ordered this encounter  Medications  . predniSONE (STERAPRED UNI-PAK 21 TAB) 10 MG (21) TBPK tablet    Sig: Take by mouth daily. Take 6 tabs by mouth daily  for 2 days, then 5 tabs for 2 days, then 4 tabs for 2 days, then 3 tabs for 2 days, 2 tabs for 2 days, then 1 tab by mouth daily for 2 days    Dispense:  42 tablet    Refill:  0    Order Specific Question:   Supervising Provider    Answer:   Raylene Everts [[id number]]  . albuterol (PROVENTIL) (2.5 MG/3ML) 0.083% nebulizer solution    Sig: Take 3 mLs (2.5 mg total) by nebulization every 6 (six) hours as needed for wheezing or shortness of breath.    Dispense:  75 mL    Refill:  12    Order Specific Question:   Supervising Provider    Answer:   Raylene Everts [[id number]]  . dexamethasone (DECADRON) injection 10 mg   Inhaler given in office Steroid shot given in office Take steroid as prescribed and to completion Albuterol solution prescribed.  Use nebulizer as directed.   Follow up with PCP next week Return here or go to ER if you have any new or worsening symptoms such as shortness of breath, difficulty breathing, accessory muscle use, rib retraction, or if symptoms do not improve with medication   Reviewed expectations re: course of current medical issues. Questions answered. Outlined signs and symptoms indicating need for more acute intervention. Patient verbalized understanding. After Visit Summary given.          Stacey Drain [place], PA-C 02/10/20 [phone]

## 2020-03-20 ENCOUNTER — Other Ambulatory Visit: Payer: Self-pay

## 2020-03-20 ENCOUNTER — Encounter: Payer: Self-pay | Admitting: Emergency Medicine

## 2020-03-20 ENCOUNTER — Ambulatory Visit
Admission: EM | Admit: 2020-03-20 | Discharge: 2020-03-20 | Disposition: A | Discharge status: Home / Self Care | Payer: Self-pay | Attending: Emergency Medicine | Admitting: Emergency Medicine

## 2020-03-20 DIAGNOSIS — J4521 Mild intermittent asthma with (acute) exacerbation: Secondary | ICD-10-CM

## 2020-03-20 MED ORDER — ALBUTEROL SULFATE 1.25 MG/3ML IN NEBU: 1.0000 | INHALATION_SOLUTION | Freq: Four times a day (QID) | RESPIRATORY_TRACT | 12 refills | Status: DC | PRN

## 2020-03-20 MED ORDER — ALBUTEROL SULFATE HFA 108 (90 BASE) MCG/ACT IN AERS: 1.0000 | INHALATION_SPRAY | Freq: Four times a day (QID) | RESPIRATORY_TRACT | 1 refills | Status: DC | PRN

## 2020-03-20 MED ORDER — METHYLPREDNISOLONE SODIUM SUCC 125 MG IJ SOLR
60.0000 mg | Freq: Once | INTRAMUSCULAR | Status: AC
  Administered 2020-03-20: 60 mg via INTRAMUSCULAR

## 2020-03-20 MED ORDER — ALBUTEROL SULFATE HFA 108 (90 BASE) MCG/ACT IN AERS
2.0000 | INHALATION_SPRAY | Freq: Once | RESPIRATORY_TRACT | Status: AC
  Administered 2020-03-20: 2 via RESPIRATORY_TRACT

## 2020-03-20 NOTE — Discharge Instructions (Addendum)
Proair inhaler prescribed.  Use as directed Albuterol for inhaler was refilled Solu-Medrol IM was given in office Follow up with PCP next week Return here or go to ER if you have any new or worsening symptoms such as shortness of breath, difficulty breathing, accessory muscle use, rib retraction, or if symptoms do not improve with medication

## 2020-03-20 NOTE — ED Triage Notes (Signed)
Cough and wheezing x 3days. Out of asthma meds

## 2020-03-20 NOTE — ED Provider Notes (Signed)
[facility]   [id number] 03/20/20 Arrival Time: 1925  Chief Complaint  Patient presents with  . Wheezing  . Cough     SUBJECTIVE: History from: patient.  Sharon Nash is a 24 y.o. female who presents with complaint of intermittent non-productive cough. Triggers: change in weather, cold symptoms, coughing, exercise, exposure to fumes, exposure to smoke and smoking. Onset gradual, approximately 2 days ago. Describes wheezing as mild when present. Fever: no. Overall normal PO intake without n/v. Sick contacts: no. Typically her asthma is well controlled. Denies fever, chills, nausea, vomiting, SOB, chest pain, abdominal pain, changes in bowel or bladder function.   Inhaler use: Albuterol.   Received flu shot this year: yes.   ROS: As per HPI.  All other pertinent ROS negative.    Past Medical History:  Diagnosis Date  . Asthma   . Chronic abdominal pain   . Chronic knee pain   . Respiratory failure, acute (HCC) 06/2012   bipap only, not intubated, due to asthma exacerbation   Past Surgical History:  Procedure Laterality Date  . None     Allergies  Allergen Reactions  . Shrimp [Shellfish Allergy] Anaphylaxis   No current facility-administered medications on file prior to encounter.   Current Outpatient Medications on File Prior to Encounter  Medication Sig Dispense Refill  . acetaminophen (TYLENOL) 500 MG tablet Take 1,000 mg by mouth every 6 (six) hours as needed.    . budesonide-formoterol (SYMBICORT) 80-4.5 MCG/ACT inhaler Inhale 2 puffs into the lungs 2 (two) times daily. To prevent wheezing 1 Inhaler 0  . ibuprofen (ADVIL,MOTRIN) 600 MG tablet Take 1 tablet (600 mg total) by mouth 4 (four) times daily. 30 tablet 0  . predniSONE (STERAPRED UNI-PAK 21 TAB) 10 MG (21) TBPK tablet Take by mouth daily. Take 6 tabs by mouth daily  for 2 days, then 5 tabs for 2 days, then 4 tabs for 2 days, then 3 tabs for 2 days, 2 tabs for 2 days, then 1 tab by mouth daily  for 2 days 42 tablet 0  . [DISCONTINUED] cetirizine (ZYRTEC ALLERGY) 10 MG tablet Take 1 tablet (10 mg total) by mouth daily. 30 tablet 2  . [DISCONTINUED] fluticasone (FLONASE) 50 MCG/ACT nasal spray Place 2 sprays into both nostrils daily. 16 g 0   Social History   Socioeconomic History  . Marital status: Single    Spouse name: Not on file  . Number of children: Not on file  . Years of education: Not on file  . Highest education level: Not on file  Occupational History  . Not on file  Tobacco Use  . Smoking status: Never Smoker  . Smokeless tobacco: Never Used  Vaping Use  . Vaping Use: Some days  Substance and Sexual Activity  . Alcohol use: No  . Drug use: No  . Sexual activity: Yes    Birth control/protection: Condom  Other Topics Concern  . Not on file  Social History Narrative  . Not on file   Social Determinants of Health   Financial Resource Strain:   . Difficulty of Paying Living Expenses:   Food Insecurity:   . Worried About Programme researcher, broadcasting/film/video in the Last Year:   . Barista in the Last Year:   Transportation Needs:   . Freight forwarder (Medical):   Marland Kitchen Lack of Transportation (Non-Medical):   Physical Activity:   . Days of Exercise per Week:   . Minutes of Exercise per  Session:   Stress:   . Feeling of Stress :   Social Connections:   . Frequency of Communication with Friends and Family:   . Frequency of Social Gatherings with Friends and Family:   . Attends Religious Services:   . Active Member of Clubs or Organizations:   . Attends Banker Meetings:   Marland Kitchen Marital Status:   Intimate Partner Violence:   . Fear of Current or Ex-Partner:   . Emotionally Abused:   Marland Kitchen Physically Abused:   . Sexually Abused:    Family History  Problem Relation Age of Onset  . Asthma Sister   . Diabetes Maternal Grandmother   . Hypertension Maternal Grandmother   . Diabetes Maternal Grandfather   . Asthma Maternal Grandfather   . Breast cancer  Maternal Aunt   . Colon cancer Neg Hx     OBJECTIVE:  Vitals:   03/20/20 1936  BP: 110/76  Pulse: 97  Resp: 16  Temp: 98.9 F (37.2 C)  TempSrc: Oral  SpO2: 97%     General appearance: alert; appears fatigued HEENT: nasal congestion; clear runny nose; throat irritation secondary to post-nasal drainage Neck: supple without LAD Lungs: unlabored respirations, mild bilateral wheezing; cough: mild; no significant respiratory distress Skin: warm and dry Psychological: alert and cooperative; normal mood and affect  Imaging: No results found.  ASSESSMENT & PLAN:  1. Mild intermittent asthma with acute exacerbation     Nebulizer treatment needed: no.  Meds ordered this encounter  Medications  . albuterol (VENTOLIN HFA) 108 (90 Base) MCG/ACT inhaler    Sig: Inhale 1-2 puffs into the lungs every 6 (six) hours as needed for wheezing or shortness of breath.    Dispense:  18 g    Refill:  1  . albuterol (ACCUNEB) 1.25 MG/3ML nebulizer solution    Sig: Take 3 mLs (1.25 mg total) by nebulization every 6 (six) hours as needed for wheezing.    Dispense:  75 mL    Refill:  12  . methylPREDNISolone sodium succinate (SOLU-MEDROL) 125 mg/2 mL injection 60 mg   Discharge instructions  Proair inhaler prescribed.  Use as directed Albuterol for inhaler was refilled Solu-Medrol IM was given in office Follow up with PCP next week Return here or go to ER if you have any new or worsening symptoms such as shortness of breath, difficulty breathing, accessory muscle use, rib retraction, or if symptoms do not improve with medication    Reviewed expectations re: course of current medical issues. Questions answered. Outlined signs and symptoms indicating need for more acute intervention. Patient verbalized understanding. After Visit Summary given.      Note: This document was prepared using Dragon voice recognition software and may include unintentional dictation errors.     Durward Parcel, FNP 03/20/20 1953

## 2020-06-05 ENCOUNTER — Inpatient Hospital Stay (HOSPITAL_COMMUNITY)
Admission: AD | Admit: 2020-06-05 | Discharge: 2020-06-05 | Disposition: A | Discharge status: Home / Self Care | Payer: Medicaid Other | Attending: Obstetrics and Gynecology | Admitting: Obstetrics and Gynecology

## 2020-06-05 ENCOUNTER — Other Ambulatory Visit: Payer: Self-pay

## 2020-06-05 ENCOUNTER — Inpatient Hospital Stay (HOSPITAL_COMMUNITY): Payer: Medicaid Other

## 2020-06-05 ENCOUNTER — Ambulatory Visit
Admission: EM | Admit: 2020-06-05 | Discharge: 2020-06-05 | Disposition: A | Discharge status: Home / Self Care | Payer: Self-pay | Attending: Emergency Medicine | Admitting: Emergency Medicine

## 2020-06-05 ENCOUNTER — Encounter: Payer: Self-pay | Admitting: Emergency Medicine

## 2020-06-05 ENCOUNTER — Encounter (HOSPITAL_COMMUNITY): Payer: Self-pay | Admitting: Obstetrics and Gynecology

## 2020-06-05 DIAGNOSIS — Z7951 Long term (current) use of inhaled steroids: Secondary | ICD-10-CM | POA: Insufficient documentation

## 2020-06-05 DIAGNOSIS — Z79899 Other long term (current) drug therapy: Secondary | ICD-10-CM | POA: Diagnosis not present

## 2020-06-05 DIAGNOSIS — O2341 Unspecified infection of urinary tract in pregnancy, first trimester: Secondary | ICD-10-CM

## 2020-06-05 DIAGNOSIS — O99511 Diseases of the respiratory system complicating pregnancy, first trimester: Secondary | ICD-10-CM | POA: Diagnosis not present

## 2020-06-05 DIAGNOSIS — O26891 Other specified pregnancy related conditions, first trimester: Secondary | ICD-10-CM

## 2020-06-05 DIAGNOSIS — B9689 Other specified bacterial agents as the cause of diseases classified elsewhere: Secondary | ICD-10-CM

## 2020-06-05 DIAGNOSIS — Z87440 Personal history of urinary (tract) infections: Secondary | ICD-10-CM | POA: Insufficient documentation

## 2020-06-05 DIAGNOSIS — Z3A01 Less than 8 weeks gestation of pregnancy: Secondary | ICD-10-CM

## 2020-06-05 DIAGNOSIS — R109 Unspecified abdominal pain: Secondary | ICD-10-CM

## 2020-06-05 DIAGNOSIS — M549 Dorsalgia, unspecified: Secondary | ICD-10-CM

## 2020-06-05 DIAGNOSIS — J45909 Unspecified asthma, uncomplicated: Secondary | ICD-10-CM | POA: Insufficient documentation

## 2020-06-05 DIAGNOSIS — O3680X0 Pregnancy with inconclusive fetal viability, not applicable or unspecified: Secondary | ICD-10-CM

## 2020-06-05 LAB — WET PREP, GENITAL
Sperm: NONE SEEN
Trich, Wet Prep: NONE SEEN
Yeast Wet Prep HPF POC: NONE SEEN

## 2020-06-05 LAB — POCT URINALYSIS DIP (MANUAL ENTRY)
Bilirubin, UA: NEGATIVE
Glucose, UA: NEGATIVE mg/dL
Ketones, POC UA: NEGATIVE mg/dL
Nitrite, UA: POSITIVE — AB
Protein Ur, POC: 100 mg/dL — AB
Spec Grav, UA: 1.02 (ref 1.010–1.025)
Urobilinogen, UA: 0.2 E.U./dL
pH, UA: 6 (ref 5.0–8.0)

## 2020-06-05 LAB — CBC
HCT: 37.3 % (ref 36.0–46.0)
Hemoglobin: 11.8 g/dL — ABNORMAL LOW (ref 12.0–15.0)
MCH: 29 pg (ref 26.0–34.0)
MCHC: 31.6 g/dL (ref 30.0–36.0)
MCV: 91.6 fL (ref 80.0–100.0)
Platelets: 332 10*3/uL (ref 150–400)
RBC: 4.07 MIL/uL (ref 3.87–5.11)
RDW: 12.3 % (ref 11.5–15.5)
WBC: 6 10*3/uL (ref 4.0–10.5)
nRBC: 0 % (ref 0.0–0.2)

## 2020-06-05 LAB — POCT URINE PREGNANCY: Preg Test, Ur: POSITIVE — AB

## 2020-06-05 LAB — HCG, QUANTITATIVE, PREGNANCY: hCG, Beta Chain, Quant, S: 14534 m[IU]/mL — ABNORMAL HIGH (ref <5)

## 2020-06-05 MED ORDER — METRONIDAZOLE 0.75 % VA GEL: 1.0000 | Freq: Every day | VAGINAL | 0 refills | Status: DC

## 2020-06-05 MED ORDER — CEPHALEXIN 500 MG PO CAPS: 500.0000 mg | ORAL_CAPSULE | Freq: Four times a day (QID) | ORAL | 0 refills | Status: DC

## 2020-06-05 MED ORDER — CYCLOBENZAPRINE HCL 5 MG PO TABS
10.0000 mg | ORAL_TABLET | Freq: Once | ORAL | Status: AC
  Administered 2020-06-05: 10 mg via ORAL
  Filled 2020-06-05: qty 2

## 2020-06-05 NOTE — MAU Provider Note (Addendum)
History     CSN: [id number]  Arrival date and time: 06/05/20 1317   First Provider Initiated Contact with Patient 06/05/20 1508      Chief Complaint  Patient presents with  . Back Pain  . Abdominal Pain   Sharon Nash is a 24 y.o. G3P2002 at 5.3 weeks Definite LMP of April 16, 2020.  She presents today for Back Pain and Abdominal Pain.  Patient states "it feels like I need to push."  She states the pain started last night at about 9pm and is intermittent in nature.  Patient describes the pain as pressure in her "stomach, side, and back."  Patient states there is no relieving or aggravating factors.  However, she does report taking tylenol with some relief, but the pain returned worse than before. Patient currently rates the pain a 9.5/10.  Patient reports "a little bit of discharge and blood in my pants."  Patient reports sexual activity yesterday without pain or discomfort.    OB History    Gravida  3   Para  2   Term  2   Preterm  0   AB  0   Living  2     SAB  0   TAB  0   Ectopic  0   Multiple  0   Live Births  2           Past Medical History:  Diagnosis Date  . Asthma   . Chronic abdominal pain   . Chronic knee pain   . Respiratory failure, acute (HCC) 06/2012   bipap only, not intubated, due to asthma exacerbation    Past Surgical History:  Procedure Laterality Date  . None      Family History  Problem Relation Age of Onset  . Asthma Sister   . Diabetes Maternal Grandmother   . Hypertension Maternal Grandmother   . Diabetes Maternal Grandfather   . Asthma Maternal Grandfather   . Breast cancer Maternal Aunt   . Colon cancer Neg Hx     Social History   Tobacco Use  . Smoking status: Never Smoker  . Smokeless tobacco: Never Used  Vaping Use  . Vaping Use: Never used  Substance Use Topics  . Alcohol use: Yes    Comment: occ  . Drug use: Yes    Types: Marijuana    Comment: 06/04/2020    Allergies:  Allergies  Allergen  Reactions  . Shrimp [Shellfish Allergy] Anaphylaxis    Medications Prior to Admission  Medication Sig Dispense Refill Last Dose  . acetaminophen (TYLENOL) 500 MG tablet Take 1,000 mg by mouth every 6 (six) hours as needed.   06/04/2020 at 2300  . albuterol (ACCUNEB) 1.25 MG/3ML nebulizer solution Take 3 mLs (1.25 mg total) by nebulization every 6 (six) hours as needed for wheezing. 75 mL 12 06/04/2020 at Unknown time  . albuterol (VENTOLIN HFA) 108 (90 Base) MCG/ACT inhaler Inhale 1-2 puffs into the lungs every 6 (six) hours as needed for wheezing or shortness of breath. 18 g 1 06/05/2020 at 1000  . budesonide-formoterol (SYMBICORT) 80-4.5 MCG/ACT inhaler Inhale 2 puffs into the lungs 2 (two) times daily. To prevent wheezing 1 Inhaler 0   . ibuprofen (ADVIL,MOTRIN) 600 MG tablet Take 1 tablet (600 mg total) by mouth 4 (four) times daily. 30 tablet 0   . predniSONE (STERAPRED UNI-PAK 21 TAB) 10 MG (21) TBPK tablet Take by mouth daily. Take 6 tabs by mouth daily  for 2 days,  then 5 tabs for 2 days, then 4 tabs for 2 days, then 3 tabs for 2 days, 2 tabs for 2 days, then 1 tab by mouth daily for 2 days 42 tablet 0     Review of Systems  Constitutional: Positive for chills. Negative for fever.  Respiratory: Negative for cough and shortness of breath.   Gastrointestinal: Positive for abdominal pain. Negative for nausea and vomiting.  Genitourinary: Positive for dysuria (Pressure), vaginal bleeding and vaginal discharge. Negative for difficulty urinating.  Musculoskeletal: Positive for back pain.  Neurological: Positive for headaches (6/10). Negative for dizziness and light-headedness.   Physical Exam   Blood pressure 124/85, pulse (!) 109, temperature 98.9 F (37.2 C), temperature source Oral, resp. rate 20, height 5\' 8"  (1.727 m), weight 86.2 kg, last menstrual period 04/28/2020, SpO2 100 %.  Physical Exam Vitals and nursing note reviewed. Exam conducted with a chaperone present.   Constitutional:      Appearance: She is well-developed.  HENT:     Head: Normocephalic and atraumatic.  Cardiovascular:     Rate and Rhythm: Normal rate and regular rhythm.  Pulmonary:     Effort: Pulmonary effort is normal.     Breath sounds: Normal breath sounds.  Abdominal:     General: Abdomen is flat. Bowel sounds are normal.     Palpations: Abdomen is soft.     Tenderness: There is no abdominal tenderness.  Genitourinary:    Vagina: Vaginal discharge present.     Cervix: No cervical motion tenderness, discharge, friability, erythema or cervical bleeding.     Uterus: Normal. Not tender.      Comments: Speculum Exam: -Normal External Genitalia: Non tender, no apparent discharge at introitus.  -Vaginal Vault: Pink mucosa with good rugae. Moderate amt thin white frothy discharge -wet prep collected -Cervix:Pink, no lesions, cysts, or polyps.  Appears closed. No active bleeding from os-GC/CT collected -Bimanual Exam:  No tenderness.  No apparent uterine enlargement  Skin:    General: Skin is warm and dry.  Neurological:     Mental Status: She is alert.     MAU Course  Procedures Results for orders placed or performed during the hospital encounter of 06/05/20 (from the past 24 hour(s))  hCG, quantitative, pregnancy     Status: Abnormal   Collection Time: 06/05/20  3:07 PM  Result Value Ref Range   hCG, Beta Chain, Quant, S 14,534 (H) <5 mIU/mL  CBC     Status: Abnormal   Collection Time: 06/05/20  3:07 PM  Result Value Ref Range   WBC 6.0 4.0 - 10.5 K/uL   RBC 4.07 3.87 - 5.11 MIL/uL   Hemoglobin 11.8 (L) 12.0 - 15.0 g/dL   HCT 06/07/20 36 - 46 %   MCV 91.6 80.0 - 100.0 fL   MCH 29.0 26.0 - 34.0 pg   MCHC 31.6 30.0 - 36.0 g/dL   RDW 85.0 27.7 - 41.2 %   Platelets 332 150 - 400 K/uL   nRBC 0.0 0.0 - 0.2 %  Wet prep, genital     Status: Abnormal   Collection Time: 06/05/20  3:22 PM   Specimen: Vaginal  Result Value Ref Range   Yeast Wet Prep HPF POC NONE SEEN NONE  SEEN   Trich, Wet Prep NONE SEEN NONE SEEN   Clue Cells Wet Prep HPF POC PRESENT (A) NONE SEEN   WBC, Wet Prep HPF POC MODERATE (A) NONE SEEN   Sperm NONE SEEN    06/07/20 OB LESS  THAN 14 WEEKS WITH OB TRANSVAGINAL  Result Date: 06/05/2020 CLINICAL DATA:  Pregnancy of unknown anatomic location; LMP 04/28/2020 EXAM: OBSTETRIC <14 WK [place] AND TRANSVAGINAL OB US TECHNIQUE: Both transabdominal and transvaginal ultrasound examinations were performed for complete evaluation of the gestation as well as the maternal uterus, adnexal regions, and pelvic cul-de-sac. Transvaginal technique was performed to assess early pregnancy. COMPARISON:  None FINDINGS: Intrauterine gestational sac: Present, single Yolk sac:  Present Embryo:  Not definitely identified Cardiac Activity: N/A Heart Rate: N/A  bpm MSD: 9.5 mm   5 w   5 d Subchorionic hemorrhage:  None visualized. Maternal uterus/adnexae: Uterus anteverted, otherwise unremarkable. RIGHT ovary measures 3.5 x 2.1 x 2.4 cm and contains a small corpus luteum. LEFT ovary normal size and morphology 2.2 x 3.9 x 1.4 cm. No free pelvic fluid or adnexal masses. IMPRESSION: Intrauterine gestational sac identified containing small yolk sac. No fetal pole is identified to establish viability. Consider follow-up ultrasound in 14 days to establish viability if clinically indicated. Remainder of exam unremarkable. Electronically Signed   By: Ulyses Southward M.D.   On: 06/05/2020 16:00     MDM Pelvic Exam; Wet Prep and GC/CT Labs: UA, UPT, CBC, hCG Ultrasound Assessment and Plan  24 year old G3P2002 at 5.3weeks Abdominal Pain  Back Pain UTI  -Reviewed POC with patient. -Exam performed and findings discussed.  -Discussed that vaginal discharge is suspicious for BV. -Patient reports she gets BV and UTI's at beginning of pregnancy. -Will give flexeril for pain. -Will send to [place] and await results.    Cherre Robins 06/05/2020, 3:08 PM   Reassessment (4:43 PM) Bacterial  Vaginosis IUGS with YS  -Results as above. -Provider to bedside to discuss and reassess. -Patient reports improvement in pain with flexeril dosing. -Patient instructed to take tylenol at home. -Informed that clue cells found and diagnosis of BV made. -Will send metrogel to pharmacy on file. -Will give Keflex 500mg  QID x 5 days for known UTI. -Informed that provider will not receive results and that UC may contact her.  -Patient requests paper script to take to [facility] due to lack of insurance. -Provider will print and give. -Patient informed of results and need for follow up. -Requests follow up in [place] at [facility]: Message sent. -Encouraged to call or return to MAU if symptoms worsen or with the onset of new symptoms. -Discharged to home in improved condition.  [place] MSN, CNM Advanced Practice Provider, Center for [facility]  5:24 PM Patient was not given written scripts. Will leave at MAU front desk.

## 2020-06-05 NOTE — MAU Note (Signed)
Presents with c/o lower back pain abdominal pain.  Reports abdominal pain is all over and feels like she has to push.  Reports +UPT @ Urgent Care today in [place].  Denies VB.  LMP 04/28/2020.

## 2020-06-05 NOTE — Discharge Instructions (Signed)
Abdominal Pain During Pregnancy  Abdominal pain is common during pregnancy, and has many possible causes. Some causes are more serious than others, and sometimes the cause is not known. Abdominal pain can be a sign that labor is starting. It can also be caused by normal growth and stretching of muscles and ligaments during pregnancy. Always tell your health care provider if you have any abdominal pain. Follow these instructions at home:  Do not have sex or put anything in your vagina until your pain goes away completely.  Get plenty of rest until your pain improves.  Drink enough fluid to keep your urine pale yellow.  Take over-the-counter and prescription medicines only as told by your health care provider.  Keep all follow-up visits as told by your health care provider. This is important. Contact a health care provider if:  Your pain continues or gets worse after resting.  You have lower abdominal pain that: ? Comes and goes at regular intervals. ? Spreads to your back. ? Is similar to menstrual cramps.  You have pain or burning when you urinate. Get help right away if:  You have a fever or chills.  You have vaginal bleeding.  You are leaking fluid from your vagina.  You are passing tissue from your vagina.  You have vomiting or diarrhea that lasts for more than 24 hours.  Your baby is moving less than usual.  You feel very weak or faint.  You have shortness of breath.  You develop severe pain in your upper abdomen. Summary  Abdominal pain is common during pregnancy, and has many possible causes.  If you experience abdominal pain during pregnancy, tell your health care provider right away.  Follow your health care provider's home care instructions and keep all follow-up visits as directed. This information is not intended to replace advice given to you by your health care provider. Make sure you discuss any questions you have with your health care  provider. Document Revised: 12/14/2018 Document Reviewed: 11/28/2016 Elsevier Patient Education  2020 Elsevier Inc.  

## 2020-06-05 NOTE — ED Notes (Signed)
Patient is being discharged from the Urgent Care and sent to the Emergency Department via pov . Per k. wurst, patient is in need of higher level of care due to abd pain w/pos preg test . Patient is aware and verbalizes understanding of plan of care.  Vitals:   06/05/20 1213 06/05/20 1215  BP:  112/68  Pulse: 79   Resp: 18   Temp: 99.2 F (37.3 C)   SpO2: 97%

## 2020-06-05 NOTE — ED Triage Notes (Signed)
Reports pressure to all over her abdomen and lower back since last night, also reports burning with urination .

## 2020-06-06 LAB — GC/CHLAMYDIA PROBE AMP (~~LOC~~) NOT AT ARMC
Chlamydia: NEGATIVE
Comment: NEGATIVE
Comment: NORMAL
Neisseria Gonorrhea: NEGATIVE

## 2020-06-07 ENCOUNTER — Encounter: Payer: Self-pay | Admitting: Obstetrics and Gynecology

## 2020-06-07 DIAGNOSIS — O2340 Unspecified infection of urinary tract in pregnancy, unspecified trimester: Secondary | ICD-10-CM | POA: Insufficient documentation

## 2020-06-07 LAB — URINE CULTURE: Culture: >=100000 — AB

## 2020-06-08 ENCOUNTER — Telehealth: Payer: Self-pay | Admitting: Advanced Practice Midwife

## 2020-06-08 NOTE — Telephone Encounter (Signed)
Called by Dr. Vergie Living 09/29. Called 2x today to discuss culture results. Voicemail full, unable to leave message. No MyChart account  Clayton Bibles, MSN, CNM Certified Nurse Midwife, [employer] for [employer], [facility] 06/08/20 8:51 AM

## 2020-06-09 DIAGNOSIS — O219 Vomiting of pregnancy, unspecified: Secondary | ICD-10-CM | POA: Diagnosis not present

## 2020-06-09 DIAGNOSIS — E876 Hypokalemia: Secondary | ICD-10-CM | POA: Diagnosis not present

## 2020-06-09 DIAGNOSIS — R197 Diarrhea, unspecified: Secondary | ICD-10-CM | POA: Diagnosis not present

## 2020-06-09 DIAGNOSIS — R109 Unspecified abdominal pain: Secondary | ICD-10-CM | POA: Diagnosis not present

## 2020-06-09 DIAGNOSIS — Z3201 Encounter for pregnancy test, result positive: Secondary | ICD-10-CM | POA: Diagnosis not present

## 2020-06-09 DIAGNOSIS — Z20822 Contact with and (suspected) exposure to covid-19: Secondary | ICD-10-CM | POA: Diagnosis not present

## 2020-06-09 DIAGNOSIS — Z3A00 Weeks of gestation of pregnancy not specified: Secondary | ICD-10-CM | POA: Diagnosis not present

## 2020-06-09 DIAGNOSIS — O99280 Endocrine, nutritional and metabolic diseases complicating pregnancy, unspecified trimester: Secondary | ICD-10-CM | POA: Diagnosis not present

## 2020-06-09 DIAGNOSIS — R112 Nausea with vomiting, unspecified: Secondary | ICD-10-CM | POA: Diagnosis not present

## 2020-06-12 ENCOUNTER — Telehealth: Payer: Self-pay | Admitting: Obstetrics & Gynecology

## 2020-06-12 ENCOUNTER — Telehealth: Payer: Self-pay | Admitting: *Deleted

## 2020-06-12 DIAGNOSIS — O219 Vomiting of pregnancy, unspecified: Secondary | ICD-10-CM | POA: Diagnosis not present

## 2020-06-12 DIAGNOSIS — O208 Other hemorrhage in early pregnancy: Secondary | ICD-10-CM | POA: Diagnosis not present

## 2020-06-12 DIAGNOSIS — O209 Hemorrhage in early pregnancy, unspecified: Secondary | ICD-10-CM | POA: Diagnosis not present

## 2020-06-12 DIAGNOSIS — Z3A01 Less than 8 weeks gestation of pregnancy: Secondary | ICD-10-CM | POA: Diagnosis not present

## 2020-06-12 DIAGNOSIS — O2341 Unspecified infection of urinary tract in pregnancy, first trimester: Secondary | ICD-10-CM | POA: Diagnosis not present

## 2020-06-12 DIAGNOSIS — Z20822 Contact with and (suspected) exposure to covid-19: Secondary | ICD-10-CM | POA: Diagnosis not present

## 2020-06-12 MED ORDER — PROMETHAZINE HCL 25 MG PO TABS: 25.0000 mg | ORAL_TABLET | Freq: Four times a day (QID) | ORAL | 1 refills | Status: DC | PRN

## 2020-06-12 MED ORDER — NITROFURANTOIN MONOHYD MACRO 100 MG PO CAPS: 100.0000 mg | ORAL_CAPSULE | Freq: Two times a day (BID) | ORAL | 0 refills | Status: DC

## 2020-06-12 NOTE — Telephone Encounter (Signed)
Macrobid and phenergan e prescribed

## 2020-06-12 NOTE — Telephone Encounter (Signed)
Patient returned nurse call.

## 2020-06-12 NOTE — Telephone Encounter (Signed)
-----   Message from Fairview Bing, MD sent at 06/07/2020  4:59 PM EDT ----- Patient called at [phone] and went straight to VM and VM is full and patient not on mychart. I also called her mother listed as [phone] but no answer.   I would have recommended she switch to macrobid bid x 7 days instead of keflex given urine culture results. I will send a note to [facility]

## 2020-06-12 NOTE — Telephone Encounter (Addendum)
Pt aware of urine culture results. Pt was prescribed keflex but according to urine culture, med needs to be switched to Macrobid BID x 7 days according to Dr. Vergie Living. Pt went to [facility] on Friday with chills, n/v, and diarrhea. Was given fluids. Was given prescription for Zofran but that's not helping. Pt is still having n/v and diarrhea. Her mouth feels moist today. Has urinated 2 times so far today. Can you prescribe a different antibiotic and a different nausea pill? Thanks!! JSY

## 2020-06-12 NOTE — Telephone Encounter (Signed)
Left message @ 9:58 am. JSY

## 2020-06-14 DIAGNOSIS — O039 Complete or unspecified spontaneous abortion without complication: Secondary | ICD-10-CM | POA: Diagnosis not present

## 2020-06-14 DIAGNOSIS — O200 Threatened abortion: Secondary | ICD-10-CM | POA: Diagnosis not present

## 2020-06-14 DIAGNOSIS — Z3A01 Less than 8 weeks gestation of pregnancy: Secondary | ICD-10-CM | POA: Diagnosis not present

## 2020-06-20 ENCOUNTER — Telehealth: Payer: Self-pay

## 2020-06-20 NOTE — Telephone Encounter (Signed)
Pt stated that she went to the hospital and had a miscarriage, can you look and tell me how I need to schedule her for a follow up?

## 2020-06-21 ENCOUNTER — Other Ambulatory Visit: Payer: Self-pay

## 2020-06-23 ENCOUNTER — Other Ambulatory Visit: Payer: Self-pay | Admitting: Obstetrics & Gynecology

## 2020-06-23 DIAGNOSIS — O3680X0 Pregnancy with inconclusive fetal viability, not applicable or unspecified: Secondary | ICD-10-CM

## 2020-06-26 ENCOUNTER — Other Ambulatory Visit: Payer: Self-pay

## 2020-08-23 ENCOUNTER — Telehealth: Payer: Self-pay | Admitting: Emergency Medicine

## 2020-08-23 MED ORDER — ALBUTEROL SULFATE HFA 108 (90 BASE) MCG/ACT IN AERS: 2.0000 | INHALATION_SPRAY | RESPIRATORY_TRACT | 0 refills | Status: DC | PRN

## 2020-09-09 NOTE — L&D Delivery Note (Addendum)
OB/GYN Faculty Practice Delivery Note  Sharon Nash is a 25 y.o. [id number] s/p SVD at [redacted]w[redacted]d. She was admitted for vaginal bleeding concerning for abruption and also early labor.   ROM: 1h 60m with clear/blood fluid GBS Status: unknown, 35 wk>PCN Maximum Maternal Temperature: 99.5 F  Labor Progress: Admitted for bright red vaginal bleeding concerning for abruption and also with contractions. Patient had previously presented to [facility] with bleeding and had received two doses of beta methasone. She was initially expectantly managed however her cervix did not make change and with her vaginal bleeding concerning for abruption she was ultimately augmented with pitocin and AROM and progressed to complete.   Delivery Date/Time: 1536 Delivery: Called to room and patient was complete and pushing. Head delivered LOA. No nuchal cord present. Shoulder and body delivered in usual fashion. Infant with spontaneous cry, placed on mother's abdomen, dried and stimulated. Cord clamped x 2 after 1-minute delay, and cut by FOB under my direct supervision. Baby was then taken to warmer for awaiting NICU team to examine. Cord blood drawn. Placenta delivered spontaneously with gentle cord traction. Fundus firm with massage and Pitocin. Labia, perineum, vagina, and cervix were inspected, with no lacerations.   Placenta: 3VC, membranes appear intact. Placenta with area of one large clot, likely c/w abruption.  Complications: none Lacerations: none EBL: 50 Analgesia: None  Infant: baby boy   APGARs 8,9   Betti Cruz, MD PGY-2 Family Medicine Resident   GME ATTESTATION:  I saw and evaluated the patient. I agree with the findings and the plan of care as documented in the residents note. I was gloved and present for entire delivery.   Warner Mccreedy, MD, MPH OB Fellow, Faculty Practice [facility], Center for [facility] 08/22/2021 4:16 PM

## 2020-09-22 ENCOUNTER — Encounter: Payer: Self-pay | Admitting: Emergency Medicine

## 2020-09-22 ENCOUNTER — Other Ambulatory Visit: Payer: Self-pay

## 2020-09-22 ENCOUNTER — Ambulatory Visit
Admission: EM | Admit: 2020-09-22 | Discharge: 2020-09-22 | Disposition: A | Discharge status: Home / Self Care | Payer: Self-pay | Attending: Emergency Medicine | Admitting: Emergency Medicine

## 2020-09-22 DIAGNOSIS — J4521 Mild intermittent asthma with (acute) exacerbation: Secondary | ICD-10-CM

## 2020-09-22 MED ORDER — ALBUTEROL SULFATE 1.25 MG/3ML IN NEBU: 1.0000 | INHALATION_SOLUTION | Freq: Four times a day (QID) | RESPIRATORY_TRACT | 12 refills | Status: DC | PRN

## 2020-09-22 MED ORDER — ALBUTEROL SULFATE HFA 108 (90 BASE) MCG/ACT IN AERS: 2.0000 | INHALATION_SPRAY | RESPIRATORY_TRACT | 0 refills | Status: DC | PRN

## 2020-09-22 MED ORDER — DEXAMETHASONE SODIUM PHOSPHATE 10 MG/ML IJ SOLN
10.0000 mg | Freq: Once | INTRAMUSCULAR | Status: AC
  Administered 2020-09-22: 10 mg via INTRAMUSCULAR

## 2020-09-22 NOTE — Discharge Instructions (Addendum)
Decadron IM was given in office ProAir was prescribed/take as directed Take steroid as prescribed and to completion Follow up with PCP next week Return here or go to ER if you have any new or worsening symptoms such as shortness of breath, difficulty breathing, accessory muscle use, rib retraction, or if symptoms do not improve with medicatio

## 2020-09-22 NOTE — ED Provider Notes (Signed)
[facility]   [id number] 09/22/20 Arrival Time: 1829   QQ:[id number]  SUBJECTIVE: History from: patient and family.  Sharon Nash is a 25 y.o. female who presents with complaint of intermittent dyspnea and non-productive cough. Triggers: change in weather, cold symptoms, coughing, exercise, exposure to fumes and exposure to smoke. Onset gradual, approximately 2 days ago. Describes wheezing as mild when present. Fever: no. Overall normal PO intake without n/v. Sick contacts: Unknown. Typically her asthma is well controlled. Denies fever, chills, nausea, vomiting, SOB, chest pain, abdominal pain, changes in bowel or bladder function.     ROS: As per HPI.  All other pertinent ROS negative.    Past Medical History:  Diagnosis Date  . Asthma   . Chronic abdominal pain   . Chronic knee pain   . Respiratory failure, acute (HCC) 06/2012   bipap only, not intubated, due to asthma exacerbation   Past Surgical History:  Procedure Laterality Date  . None     Allergies  Allergen Reactions  . Shrimp [Shellfish Allergy] Anaphylaxis   No current facility-administered medications on file prior to encounter.   Current Outpatient Medications on File Prior to Encounter  Medication Sig Dispense Refill  . acetaminophen (TYLENOL) 500 MG tablet Take 1,000 mg by mouth every 6 (six) hours as needed.    . budesonide-formoterol (SYMBICORT) 80-4.5 MCG/ACT inhaler Inhale 2 puffs into the lungs 2 (two) times daily. To prevent wheezing 1 Inhaler 0  . cephALEXin (KEFLEX) 500 MG capsule Take 1 capsule (500 mg total) by mouth 4 (four) times daily. 20 capsule 0  . metroNIDAZOLE (METROGEL VAGINAL) 0.75 % vaginal gel Place 1 Applicatorful vaginally at bedtime. Insert one applicator, at bedtime, for 5 nights. 70 g 0  . nitrofurantoin, macrocrystal-monohydrate, (MACROBID) 100 MG capsule Take 1 capsule (100 mg total) by mouth 2 (two) times daily. 14 capsule 0  . promethazine (PHENERGAN) 25 MG tablet  Take 1 tablet (25 mg total) by mouth every 6 (six) hours as needed for nausea or vomiting. 30 tablet 1  . [DISCONTINUED] cetirizine (ZYRTEC ALLERGY) 10 MG tablet Take 1 tablet (10 mg total) by mouth daily. 30 tablet 2  . [DISCONTINUED] fluticasone (FLONASE) 50 MCG/ACT nasal spray Place 2 sprays into both nostrils daily. 16 g 0   Social History   Socioeconomic History  . Marital status: Single    Spouse name: Not on file  . Number of children: Not on file  . Years of education: Not on file  . Highest education level: Not on file  Occupational History  . Not on file  Tobacco Use  . Smoking status: Never Smoker  . Smokeless tobacco: Never Used  Vaping Use  . Vaping Use: Never used  Substance and Sexual Activity  . Alcohol use: Yes    Comment: occ  . Drug use: Yes    Types: Marijuana    Comment: 06/04/2020  . Sexual activity: Yes    Birth control/protection: Condom  Other Topics Concern  . Not on file  Social History Narrative  . Not on file   Social Determinants of Health   Financial Resource Strain: Not on file  Food Insecurity: Not on file  Transportation Needs: Not on file  Physical Activity: Not on file  Stress: Not on file  Social Connections: Not on file  Intimate Partner Violence: Not on file   Family History  Problem Relation Age of Onset  . Asthma Sister   . Diabetes Maternal Grandmother   . Hypertension  Maternal Grandmother   . Diabetes Maternal Grandfather   . Asthma Maternal Grandfather   . Breast cancer Maternal Aunt   . Colon cancer Neg Hx     OBJECTIVE:  Vitals:   09/22/20 1927 09/22/20 1928  BP:  111/74  Pulse:  87  Resp:  18  Temp:  98.2 F (36.8 C)  TempSrc:  Oral  SpO2:  98%  Weight: 168 lb (76.2 kg)   Height: 5\' 8"  (1.727 m)      General appearance: alert; appears fatigued HEENT: nasal congestion; clear runny nose; throat irritation secondary to post-nasal drainage Neck: supple without LAD Lungs: unlabored respirations, mild  bilateral wheezing; cough: mild; no significant respiratory distress Skin: warm and dry Psychological: alert and cooperative; normal mood and affect  Imaging: No results found.  ASSESSMENT & PLAN:  1. Mild intermittent asthma with acute exacerbation     Nebulizer treatment needed: no.   Meds ordered this encounter  Medications  . dexamethasone (DECADRON) injection 10 mg  . albuterol (ACCUNEB) 1.25 MG/3ML nebulizer solution    Sig: Take 3 mLs (1.25 mg total) by nebulization every 6 (six) hours as needed for wheezing.    Dispense:  75 mL    Refill:  12  . albuterol (VENTOLIN HFA) 108 (90 Base) MCG/ACT inhaler    Sig: Inhale 2 puffs into the lungs every 4 (four) hours as needed for wheezing or shortness of breath.    Dispense:  18 g    Refill:  0    Discharge instructions  Decadron IM was given in office ProAir was prescribed/take as directed Follow up with PCP next week Return here or go to ER if you have any new or worsening symptoms such as shortness of breath, difficulty breathing, accessory muscle use, rib retraction, or if symptoms do not improve with medication    Reviewed expectations re: course of current medical issues. Questions answered. Outlined signs and symptoms indicating need for more acute intervention. Patient verbalized understanding. After Visit Summary given.          , FNP 09/22/20 1952

## 2020-09-22 NOTE — ED Triage Notes (Signed)
Flare up of asthma, states she needs more nebulizer medication also wants a steroid shot

## 2020-10-25 ENCOUNTER — Ambulatory Visit
Admission: EM | Admit: 2020-10-25 | Discharge: 2020-10-25 | Disposition: A | Discharge status: Home / Self Care | Payer: Medicaid Other | Attending: Emergency Medicine | Admitting: Emergency Medicine

## 2020-10-25 ENCOUNTER — Other Ambulatory Visit: Payer: Self-pay

## 2020-10-25 ENCOUNTER — Encounter: Payer: Self-pay | Admitting: Emergency Medicine

## 2020-10-25 DIAGNOSIS — J4521 Mild intermittent asthma with (acute) exacerbation: Secondary | ICD-10-CM | POA: Diagnosis not present

## 2020-10-25 DIAGNOSIS — Z1152 Encounter for screening for COVID-19: Secondary | ICD-10-CM

## 2020-10-25 DIAGNOSIS — J069 Acute upper respiratory infection, unspecified: Secondary | ICD-10-CM | POA: Diagnosis not present

## 2020-10-25 MED ORDER — AZITHROMYCIN 250 MG PO TABS: 250.0000 mg | ORAL_TABLET | Freq: Every day | ORAL | 0 refills | Status: DC

## 2020-10-25 MED ORDER — PREDNISONE 10 MG PO TABS: 20.0000 mg | ORAL_TABLET | Freq: Every day | ORAL | 0 refills | Status: DC

## 2020-10-25 MED ORDER — DEXAMETHASONE SODIUM PHOSPHATE 10 MG/ML IJ SOLN
10.0000 mg | Freq: Once | INTRAMUSCULAR | Status: AC
  Administered 2020-10-25: 10 mg via INTRAMUSCULAR

## 2020-10-25 MED ORDER — ALBUTEROL SULFATE 1.25 MG/3ML IN NEBU: 1.0000 | INHALATION_SOLUTION | Freq: Four times a day (QID) | RESPIRATORY_TRACT | 12 refills | Status: DC | PRN

## 2020-10-25 MED ORDER — ALBUTEROL SULFATE HFA 108 (90 BASE) MCG/ACT IN AERS: 2.0000 | INHALATION_SPRAY | RESPIRATORY_TRACT | 6 refills | Status: DC | PRN

## 2020-10-25 MED ORDER — BENZONATATE 100 MG PO CAPS: 100.0000 mg | ORAL_CAPSULE | Freq: Three times a day (TID) | ORAL | 0 refills | Status: DC | PRN

## 2020-10-25 NOTE — ED Provider Notes (Addendum)
[facility]   [id number] 10/25/20 Arrival Time: 1934   CC: COVID symptoms  SUBJECTIVE: History from: patient.  Sharon Nash is a 25 y.o. female with history of asthma presented to the urgent care with a complaint of productive cough with green sputum.  Denies sick exposure to COVID, flu or strep.  Denies recent travel.  Has tried OTC medication without relief.  Denies alleviating or aggravating factors.  Denies previous symptoms in the past.   Denies fever, chills, fatigue, sinus pain, rhinorrhea, sore throat, SOB, wheezing, chest pain, nausea, changes in bowel or bladder habits.     ROS: As per HPI.  All other pertinent ROS negative.     Past Medical History:  Diagnosis Date  . Asthma   . Chronic abdominal pain   . Chronic knee pain   . Respiratory failure, acute (HCC) 06/2012   bipap only, not intubated, due to asthma exacerbation   Past Surgical History:  Procedure Laterality Date  . None     Allergies  Allergen Reactions  . Shrimp [Shellfish Allergy] Anaphylaxis   No current facility-administered medications on file prior to encounter.   Current Outpatient Medications on File Prior to Encounter  Medication Sig Dispense Refill  . acetaminophen (TYLENOL) 500 MG tablet Take 1,000 mg by mouth every 6 (six) hours as needed.    . budesonide-formoterol (SYMBICORT) 80-4.5 MCG/ACT inhaler Inhale 2 puffs into the lungs 2 (two) times daily. To prevent wheezing 1 Inhaler 0  . cephALEXin (KEFLEX) 500 MG capsule Take 1 capsule (500 mg total) by mouth 4 (four) times daily. 20 capsule 0  . metroNIDAZOLE (METROGEL VAGINAL) 0.75 % vaginal gel Place 1 Applicatorful vaginally at bedtime. Insert one applicator, at bedtime, for 5 nights. 70 g 0  . nitrofurantoin, macrocrystal-monohydrate, (MACROBID) 100 MG capsule Take 1 capsule (100 mg total) by mouth 2 (two) times daily. 14 capsule 0  . promethazine (PHENERGAN) 25 MG tablet Take 1 tablet (25 mg total) by mouth every 6 (six)  hours as needed for nausea or vomiting. 30 tablet 1  . [DISCONTINUED] cetirizine (ZYRTEC ALLERGY) 10 MG tablet Take 1 tablet (10 mg total) by mouth daily. 30 tablet 2  . [DISCONTINUED] fluticasone (FLONASE) 50 MCG/ACT nasal spray Place 2 sprays into both nostrils daily. 16 g 0   Social History   Socioeconomic History  . Marital status: Single    Spouse name: Not on file  . Number of children: Not on file  . Years of education: Not on file  . Highest education level: Not on file  Occupational History  . Not on file  Tobacco Use  . Smoking status: Never Smoker  . Smokeless tobacco: Never Used  Vaping Use  . Vaping Use: Never used  Substance and Sexual Activity  . Alcohol use: Yes    Comment: occ  . Drug use: Yes    Types: Marijuana    Comment: 06/04/2020  . Sexual activity: Yes    Birth control/protection: Condom  Other Topics Concern  . Not on file  Social History Narrative  . Not on file   Social Determinants of Health   Financial Resource Strain: Not on file  Food Insecurity: Not on file  Transportation Needs: Not on file  Physical Activity: Not on file  Stress: Not on file  Social Connections: Not on file  Intimate Partner Violence: Not on file   Family History  Problem Relation Age of Onset  . Asthma Sister   . Diabetes Maternal Grandmother   .  Hypertension Maternal Grandmother   . Diabetes Maternal Grandfather   . Asthma Maternal Grandfather   . Breast cancer Maternal Aunt   . Colon cancer Neg Hx     OBJECTIVE:  Vitals:   10/25/20 1939  BP: 125/78  Pulse: 94  Resp: 18  Temp: 98.2 F (36.8 C)  TempSrc: Oral  SpO2: 94%     Physical Exam Vitals and nursing note reviewed.  Constitutional:      General: She is not in acute distress.    Appearance: Normal appearance. She is normal weight. She is not ill-appearing, toxic-appearing or diaphoretic.  HENT:     Head: Normocephalic.  Cardiovascular:     Rate and Rhythm: Normal rate and regular rhythm.      Pulses: Normal pulses.     Heart sounds: Normal heart sounds. No murmur heard. No friction rub. No gallop.   Pulmonary:     Effort: Pulmonary effort is normal. No respiratory distress.     Breath sounds: No stridor. Wheezing present. No rhonchi or rales.  Chest:     Chest wall: No tenderness.  Neurological:     Mental Status: She is alert and oriented to person, place, and time.     LABS:  No results found for this or any previous visit (from the past 24 hour(s)).   ASSESSMENT & PLAN:  1. Mild intermittent asthma with exacerbation   2. Encounter for screening for COVID-19     Meds ordered this encounter  Medications  . benzonatate (TESSALON) 100 MG capsule    Sig: Take 1 capsule (100 mg total) by mouth 3 (three) times daily as needed for cough.    Dispense:  30 capsule    Refill:  0  . predniSONE (DELTASONE) 10 MG tablet    Sig: Take 2 tablets (20 mg total) by mouth daily.    Dispense:  15 tablet    Refill:  0  . albuterol (ACCUNEB) 1.25 MG/3ML nebulizer solution    Sig: Take 3 mLs (1.25 mg total) by nebulization every 6 (six) hours as needed for wheezing.    Dispense:  75 mL    Refill:  12  . albuterol (VENTOLIN HFA) 108 (90 Base) MCG/ACT inhaler    Sig: Inhale 2 puffs into the lungs every 4 (four) hours as needed for wheezing or shortness of breath.    Dispense:  18 g    Refill:  6  . azithromycin (ZITHROMAX) 250 MG tablet    Sig: Take 1 tablet (250 mg total) by mouth daily. Take first 2 tablets together, then 1 every day until finished.    Dispense:  6 tablet    Refill:  0   COVID-19 test was completed for rule out.  Discharge Instructions  COVID testing ordered.  It will take between 2-7 days for test results.  Someone will contact you regarding abnormal results.    Get plenty of rest and push fluids Tessalon Perles prescribed for cough Albuterol for inhaler and albuterol for nebulizer were prescribed  Prednisone was prescribed Azithromycin was  prescribed Decadron IM was given in office Use medications daily for symptom relief Use OTC medications like ibuprofen or tylenol as needed fever or pain Call or go to the ED if you have any new or worsening symptoms such as fever, worsening cough, shortness of breath, chest tightness, chest pain, turning blue, changes in mental status, etc...   Reviewed expectations re: course of current medical issues. Questions answered. Outlined signs and symptoms indicating  need for more acute intervention. Patient verbalized understanding. After Visit Summary given.         Durward Parcel, FNP 10/25/20 1955    Durward Parcel, FNP 10/25/20 [phone]

## 2020-10-25 NOTE — ED Triage Notes (Signed)
Productive Cough x 1 week - green sputum.  States asthma has been flared up over the last several months

## 2020-10-25 NOTE — Discharge Instructions (Addendum)
COVID testing ordered.  It will take between 2-7 days for test results.  Someone will contact you regarding abnormal results.    Get plenty of rest and push fluids Tessalon Perles prescribed for cough Albuterol for inhaler and albuterol for nebulizer were prescribed  Prednisone was prescribed Azithromycin was prescribed Decadron IM was given in office Use medications daily for symptom relief Use OTC medications like ibuprofen or tylenol as needed fever or pain Call or go to the ED if you have any new or worsening symptoms such as fever, worsening cough, shortness of breath, chest tightness, chest pain, turning blue, changes in mental status, etc..Marland Kitchen

## 2020-10-25 NOTE — ED Notes (Signed)
Patient given albuterol inhaler to go home with per K. Howell Rucks, NP

## 2020-10-26 LAB — COVID-19, FLU A+B NAA
Influenza A, NAA: NOT DETECTED
Influenza B, NAA: NOT DETECTED
SARS-CoV-2, NAA: NOT DETECTED

## 2020-12-14 ENCOUNTER — Other Ambulatory Visit: Payer: Self-pay

## 2020-12-14 ENCOUNTER — Ambulatory Visit
Admission: EM | Admit: 2020-12-14 | Discharge: 2020-12-14 | Disposition: A | Discharge status: Home / Self Care | Payer: Medicaid Other | Attending: Emergency Medicine | Admitting: Emergency Medicine

## 2020-12-14 ENCOUNTER — Encounter: Payer: Self-pay | Admitting: Emergency Medicine

## 2020-12-14 DIAGNOSIS — R062 Wheezing: Secondary | ICD-10-CM

## 2020-12-14 DIAGNOSIS — J45909 Unspecified asthma, uncomplicated: Secondary | ICD-10-CM | POA: Diagnosis not present

## 2020-12-14 DIAGNOSIS — J410 Simple chronic bronchitis: Secondary | ICD-10-CM | POA: Diagnosis not present

## 2020-12-14 DIAGNOSIS — J4541 Moderate persistent asthma with (acute) exacerbation: Secondary | ICD-10-CM

## 2020-12-14 DIAGNOSIS — R059 Cough, unspecified: Secondary | ICD-10-CM

## 2020-12-14 MED ORDER — DEXAMETHASONE SODIUM PHOSPHATE 10 MG/ML IJ SOLN
10.0000 mg | Freq: Once | INTRAMUSCULAR | Status: AC
  Administered 2020-12-14: 10 mg via INTRAMUSCULAR

## 2020-12-14 MED ORDER — PREDNISONE 10 MG (21) PO TBPK: ORAL_TABLET | Freq: Every day | ORAL | 0 refills | Status: DC

## 2020-12-14 MED ORDER — ALBUTEROL SULFATE HFA 108 (90 BASE) MCG/ACT IN AERS
2.0000 | INHALATION_SPRAY | Freq: Once | RESPIRATORY_TRACT | Status: AC
  Administered 2020-12-14: 2 via RESPIRATORY_TRACT

## 2020-12-14 MED ORDER — BUDESONIDE-FORMOTEROL FUMARATE 80-4.5 MCG/ACT IN AERO: 2.0000 | INHALATION_SPRAY | Freq: Two times a day (BID) | RESPIRATORY_TRACT | 2 refills | Status: DC

## 2020-12-14 MED ORDER — NEBULIZER/TUBING/MOUTHPIECE KIT: PACK | 0 refills | Status: DC

## 2020-12-14 MED ORDER — ALBUTEROL SULFATE (5 MG/ML) 0.5% IN NEBU: 2.5000 mg | INHALATION_SOLUTION | Freq: Four times a day (QID) | RESPIRATORY_TRACT | 12 refills | Status: DC | PRN

## 2020-12-14 NOTE — ED Provider Notes (Signed)
[place]   [id number] 12/14/20 Arrival Time: 0827   [id number]  SUBJECTIVE: History from: patient.  Sharon Nash is a 25 y.o. female who presents with complaint of intermittent dyspnea, productive cough with clear sputum and wheezing. Triggers: None. Onset abrupt, approximately 3 days ago. Describes wheezing as moderate when present. Fever: no. Overall normal PO intake without n/v. Sick contacts: no. Has been using nebulizer solution and inhaler without relief.  Typically her asthma is somewhat controlled. Denies fever, chills, nausea, vomiting, SOB, chest pain, abdominal pain, changes in bowel or bladder function.    ROS: As per HPI.  All other pertinent ROS negative.    Past Medical History:  Diagnosis Date  . Asthma   . Chronic abdominal pain   . Chronic knee pain   . Respiratory failure, acute ([place]) 06/2012   bipap only, not intubated, due to asthma exacerbation   Past Surgical History:  Procedure Laterality Date  . None     Allergies  Allergen Reactions  . Shrimp [Shellfish Allergy] Anaphylaxis   No current facility-administered medications on file prior to encounter.   Current Outpatient Medications on File Prior to Encounter  Medication Sig Dispense Refill  . acetaminophen (TYLENOL) 500 MG tablet Take 1,000 mg by mouth every 6 (six) hours as needed.    Marland Kitchen albuterol (VENTOLIN HFA) 108 (90 Base) MCG/ACT inhaler Inhale 2 puffs into the lungs every 4 (four) hours as needed for wheezing or shortness of breath. 18 g 6  . promethazine (PHENERGAN) 25 MG tablet Take 1 tablet (25 mg total) by mouth every 6 (six) hours as needed for nausea or vomiting. 30 tablet 1  . [DISCONTINUED] cetirizine (ZYRTEC ALLERGY) 10 MG tablet Take 1 tablet (10 mg total) by mouth daily. 30 tablet 2  . [DISCONTINUED] fluticasone (FLONASE) 50 MCG/ACT nasal spray Place 2 sprays into both nostrils daily. 16 g 0   Social History   Socioeconomic History  . Marital status: Single     Spouse name: Not on file  . Number of children: Not on file  . Years of education: Not on file  . Highest education level: Not on file  Occupational History  . Not on file  Tobacco Use  . Smoking status: Never Smoker  . Smokeless tobacco: Never Used  Vaping Use  . Vaping Use: Never used  Substance and Sexual Activity  . Alcohol use: Yes    Comment: occ  . Drug use: Yes    Types: Marijuana    Comment: 06/04/2020  . Sexual activity: Yes    Birth control/protection: Condom  Other Topics Concern  . Not on file  Social History Narrative  . Not on file   Social Determinants of Health   Financial Resource Strain: Not on file  Food Insecurity: Not on file  Transportation Needs: Not on file  Physical Activity: Not on file  Stress: Not on file  Social Connections: Not on file  Intimate Partner Violence: Not on file   Family History  Problem Relation Age of Onset  . Asthma Sister   . Diabetes Maternal Grandmother   . Hypertension Maternal Grandmother   . Diabetes Maternal Grandfather   . Asthma Maternal Grandfather   . Breast cancer Maternal Aunt   . Colon cancer Neg Hx     OBJECTIVE:  Vitals:   12/14/20 0836 12/14/20 0844  BP: 131/83   Pulse: 79   Resp: 17   Temp: 97.6 F (36.4 C)   TempSrc: Tympanic  SpO2: 92%   Weight:  205 lb (93 kg)     General appearance: alert; appears fatigued HEENT: nasal congestion; clear runny nose, turbinates swollen and erythematous; throat irritation secondary to post-nasal drainage Neck: supple without LAD Lungs: unlabored respirations, audible wheezes, moderate bilateral wheezing; cough: mild; no significant respiratory distress Skin: warm and dry Psychological: alert and cooperative; normal mood and affect  ASSESSMENT & PLAN:  1. Moderate persistent asthma with acute exacerbation   2. Cough   3. Wheezing     Meds ordered this encounter  Medications  . budesonide-formoterol (SYMBICORT) 80-4.5 MCG/ACT inhaler    Sig:  Inhale 2 puffs into the lungs 2 (two) times daily. To prevent wheezing    Dispense:  1 each    Refill:  2    Order Specific Question:   Supervising Provider    Answer:   Raylene Everts [[id number]]  . albuterol (PROVENTIL) (5 MG/ML) 0.5% nebulizer solution    Sig: Take 0.5 mLs (2.5 mg total) by nebulization every 6 (six) hours as needed for wheezing or shortness of breath.    Dispense:  20 mL    Refill:  12    Order Specific Question:   Supervising Provider    Answer:   Raylene Everts [[id number]]  . predniSONE (STERAPRED UNI-PAK 21 TAB) 10 MG (21) TBPK tablet    Sig: Take by mouth daily. Take 6 tabs by mouth daily  for 2 days, then 5 tabs for 2 days, then 4 tabs for 2 days, then 3 tabs for 2 days, 2 tabs for 2 days, then 1 tab by mouth daily for 2 days    Dispense:  42 tablet    Refill:  0    Order Specific Question:   Supervising Provider    Answer:   Raylene Everts [[id number]]  . dexamethasone (DECADRON) injection 10 mg  . albuterol (VENTOLIN HFA) 108 (90 Base) MCG/ACT inhaler 2 puff  . Respiratory Therapy Supplies (NEBULIZER/TUBING/MOUTHPIECE) KIT    Sig: Use as directed with nebulizer    Dispense:  1 kit    Refill:  0    Order Specific Question:   Supervising Provider    Answer:   Raylene Everts [[id number]]    Albuterol inhaler given in office.  Use as directed Steroid shot given in office Prednisone taper prescribed.  Take as directed and to completion Symbicort refilled Nebulizer supplies sent to [employer] Follow up with PCP next week for recheck and to ensure symptoms are improving Return here or go to ER if you have any new or worsening symptoms such as shortness of breath, difficulty breathing, accessory muscle use, rib retraction, or if symptoms do not improve with medication    Reviewed expectations re: course of current medical issues. Questions answered. Outlined signs and symptoms indicating need for more acute intervention. Patient verbalized  understanding. After Visit Summary given.          Lestine Box, PA-C 12/14/20 [phone]

## 2020-12-14 NOTE — ED Triage Notes (Signed)
Pt is coughing and clear like hard pieces of mucous is coming up.  Pt does have asthma.  Allergies medication and tylenol cough medicines OTC .

## 2020-12-14 NOTE — Discharge Instructions (Signed)
Albuterol inhaler given in office.  Use as directed Steroid shot given in office Prednisone taper prescribed.  Take as directed and to completion Symbicort refilled Nebulizer supplies sent to [employer] [facility] Follow up with PCP next week for recheck and to ensure symptoms are improving Return here or go to ER if you have any new or worsening symptoms such as shortness of breath, difficulty breathing, accessory muscle use, rib retraction, or if symptoms do not improve with medication

## 2021-01-30 ENCOUNTER — Other Ambulatory Visit: Payer: Self-pay | Admitting: Obstetrics & Gynecology

## 2021-01-30 ENCOUNTER — Ambulatory Visit (INDEPENDENT_AMBULATORY_CARE_PROVIDER_SITE_OTHER): Payer: Medicaid Other

## 2021-01-30 ENCOUNTER — Other Ambulatory Visit: Payer: Self-pay

## 2021-01-30 VITALS — BP 127/77 | HR 88 | Ht 67.0 in | Wt 213.4 lb

## 2021-01-30 DIAGNOSIS — Z3200 Encounter for pregnancy test, result unknown: Secondary | ICD-10-CM | POA: Diagnosis not present

## 2021-01-30 DIAGNOSIS — O3680X0 Pregnancy with inconclusive fetal viability, not applicable or unspecified: Secondary | ICD-10-CM

## 2021-01-30 LAB — POCT URINE PREGNANCY: Preg Test, Ur: POSITIVE — AB

## 2021-01-30 NOTE — Progress Notes (Signed)
   NURSE VISIT- PREGNANCY CONFIRMATION   SUBJECTIVE:  Sharon Nash is a 25 y.o. [phone] female at [redacted]w[redacted]d by certain LMP of Patient's last menstrual period was 11/23/2020 (approximate). Here for pregnancy confirmation.  Home pregnancy test: positive x 2  She reports nausea.  She is not taking prenatal vitamins.    OBJECTIVE:  BP 127/77 (BP Location: Right Arm, Patient Position: Sitting, Cuff Size: Normal)   Pulse 88   Ht 5\' 7"  (1.702 m)   Wt 213 lb 6.4 oz (96.8 kg)   LMP 11/23/2020 (Approximate)   Breastfeeding No   BMI 33.42 kg/m   Appears well, in no apparent distress OB History  Gravida Para Term Preterm AB Living  4 2 2  0 0 2  SAB IAB Ectopic Multiple Live Births  0 0 0 0 2    # Outcome Date GA Lbr Len/2nd Weight Sex Delivery Anes PTL Lv  4 Current           3 Term 03/02/16 [redacted]w[redacted]d 07:33 / 00:29 7 lb 4.8 oz (3.31 kg) F Vag-Spont Other  LIV  2 Term 02/19/11 [redacted]w[redacted]d  7 lb 6 oz (3.345 kg) M Vag-Spont  N LIV  1 Gravida             Results for orders placed or performed in visit on 01/30/21 (from the past 24 hour(s))  POCT urine pregnancy   Collection Time: 01/30/21 10:28 AM  Result Value Ref Range   Preg Test, Ur Positive (A) Negative    ASSESSMENT: Positive pregnancy test, [redacted]w[redacted]d by LMP    PLAN: Schedule for dating ultrasound in 1 weeks Prenatal vitamins: plans to begin OTC ASAP   Nausea medicines: not currently needed   OB packet given: Yes  Demontay Grantham A Anedra Penafiel  01/30/2021 10:32 AM

## 2021-01-31 ENCOUNTER — Ambulatory Visit (INDEPENDENT_AMBULATORY_CARE_PROVIDER_SITE_OTHER): Payer: Medicaid Other

## 2021-01-31 DIAGNOSIS — O3680X0 Pregnancy with inconclusive fetal viability, not applicable or unspecified: Secondary | ICD-10-CM | POA: Diagnosis not present

## 2021-01-31 NOTE — Progress Notes (Signed)
[place] 6+1 wks,single IUP with YS,FHR 124 BPM,normal ovaries,crl 4.02 mm

## 2021-02-26 ENCOUNTER — Other Ambulatory Visit: Payer: Self-pay

## 2021-02-26 ENCOUNTER — Telehealth: Payer: Self-pay | Admitting: Women's Health

## 2021-02-26 MED ORDER — PROMETHAZINE HCL 25 MG PO TABS: 25.0000 mg | ORAL_TABLET | Freq: Four times a day (QID) | ORAL | 1 refills | Status: DC | PRN

## 2021-02-26 NOTE — Telephone Encounter (Signed)
Called pt to relay msg and advice from [employer]. Pt confirmed understanding.

## 2021-02-26 NOTE — Telephone Encounter (Signed)
PT CALLING STATING THAT SHE IS NASSUA AND CONSPTATION BLEEDING AND SHARPE PAIN WHEN POOPOOING

## 2021-02-26 NOTE — Telephone Encounter (Signed)
Returned pt's call for clarification. Pt msg routed to [employer].

## 2021-03-07 ENCOUNTER — Encounter: Payer: Self-pay | Admitting: Women's Health

## 2021-03-07 DIAGNOSIS — O26899 Other specified pregnancy related conditions, unspecified trimester: Secondary | ICD-10-CM | POA: Insufficient documentation

## 2021-03-07 DIAGNOSIS — Z3490 Encounter for supervision of normal pregnancy, unspecified, unspecified trimester: Secondary | ICD-10-CM | POA: Insufficient documentation

## 2021-03-13 ENCOUNTER — Other Ambulatory Visit: Payer: Self-pay | Admitting: Obstetrics & Gynecology

## 2021-03-13 DIAGNOSIS — Z3682 Encounter for antenatal screening for nuchal translucency: Secondary | ICD-10-CM

## 2021-03-14 ENCOUNTER — Other Ambulatory Visit: Payer: Self-pay

## 2021-03-14 ENCOUNTER — Ambulatory Visit: Payer: Medicaid Other | Admitting: *Deleted

## 2021-03-14 ENCOUNTER — Other Ambulatory Visit (HOSPITAL_COMMUNITY)
Admission: RE | Admit: 2021-03-14 | Discharge: 2021-03-14 | Disposition: A | Discharge status: Home / Self Care | Payer: Medicaid Other | Source: Ambulatory Visit | Attending: Obstetrics & Gynecology | Admitting: Obstetrics & Gynecology

## 2021-03-14 ENCOUNTER — Encounter: Payer: Self-pay | Admitting: Women's Health

## 2021-03-14 ENCOUNTER — Ambulatory Visit (INDEPENDENT_AMBULATORY_CARE_PROVIDER_SITE_OTHER): Payer: Medicaid Other | Admitting: Women's Health

## 2021-03-14 ENCOUNTER — Ambulatory Visit (INDEPENDENT_AMBULATORY_CARE_PROVIDER_SITE_OTHER): Payer: Medicaid Other

## 2021-03-14 VITALS — BP 123/73 | HR 79 | Wt 203.0 lb

## 2021-03-14 DIAGNOSIS — Z113 Encounter for screening for infections with a predominantly sexual mode of transmission: Secondary | ICD-10-CM

## 2021-03-14 DIAGNOSIS — Z3480 Encounter for supervision of other normal pregnancy, unspecified trimester: Secondary | ICD-10-CM | POA: Diagnosis not present

## 2021-03-14 DIAGNOSIS — Z124 Encounter for screening for malignant neoplasm of cervix: Secondary | ICD-10-CM | POA: Insufficient documentation

## 2021-03-14 DIAGNOSIS — Z3A12 12 weeks gestation of pregnancy: Secondary | ICD-10-CM | POA: Insufficient documentation

## 2021-03-14 DIAGNOSIS — Z6791 Unspecified blood type, Rh negative: Secondary | ICD-10-CM

## 2021-03-14 DIAGNOSIS — F1290 Cannabis use, unspecified, uncomplicated: Secondary | ICD-10-CM | POA: Insufficient documentation

## 2021-03-14 DIAGNOSIS — O26899 Other specified pregnancy related conditions, unspecified trimester: Secondary | ICD-10-CM | POA: Diagnosis not present

## 2021-03-14 DIAGNOSIS — Z3682 Encounter for antenatal screening for nuchal translucency: Secondary | ICD-10-CM | POA: Diagnosis not present

## 2021-03-14 DIAGNOSIS — Z3481 Encounter for supervision of other normal pregnancy, first trimester: Secondary | ICD-10-CM | POA: Diagnosis not present

## 2021-03-14 DIAGNOSIS — Z31430 Encounter of female for testing for genetic disease carrier status for procreative management: Secondary | ICD-10-CM | POA: Diagnosis not present

## 2021-03-14 LAB — POCT URINALYSIS DIPSTICK OB
Blood, UA: NEGATIVE
Glucose, UA: NEGATIVE
Ketones, UA: NEGATIVE
Leukocytes, UA: NEGATIVE
Nitrite, UA: NEGATIVE
POC,PROTEIN,UA: NEGATIVE

## 2021-03-14 MED ORDER — BLOOD PRESSURE MONITOR MISC: 0 refills | Status: DC

## 2021-03-14 MED ORDER — DOXYLAMINE-PYRIDOXINE 10-10 MG PO TBEC: DELAYED_RELEASE_TABLET | ORAL | 6 refills | Status: DC

## 2021-03-14 NOTE — Patient Instructions (Signed)
Sharon Nash, thank you for choosing our office today! We appreciate the opportunity to meet your healthcare needs. You may receive a short survey by mail, e-mail, or through [employer]. If you are happy with your care we would appreciate if you could take just a few minutes to complete the survey questions. We read all of your comments and take your feedback very seriously. Thank you again for choosing our office.  [facility] [employer] [facility] Team at [facility]  [facility] at [facility] ([address] [place], [place] [address]) Entrance C, located off of [address] Free 24/7 valet parking   Nausea & Vomiting Have saltine crackers or pretzels by your bed and eat a few bites before you raise your head out of bed in the morning Eat small frequent meals throughout the day instead of large meals Drink plenty of fluids throughout the day to stay hydrated, just don't drink a lot of fluids with your meals.  This can make your stomach fill up faster making you feel sick Do not brush your teeth right after you eat Products with real ginger are good for nausea, like ginger ale and ginger hard candy Make sure it says made with real ginger! Sucking on sour candy like lemon heads is also good for nausea If your prenatal vitamins make you nauseated, take them at night so you will sleep through the nausea Sea Bands If you feel like you need medicine for the nausea & vomiting please let us know If you are unable to keep any fluids or food down please let us know   Constipation Drink plenty of fluid, preferably water, throughout the day Eat foods high in fiber such as fruits, vegetables, and grains Exercise, such as walking, is a good way to keep your bowels regular Drink warm fluids, especially warm prune juice, or decaf coffee Eat a 1/2 cup of real oatmeal (not instant), 1/2 cup applesauce, and 1/2-1 cup warm prune juice every day If needed, you may take Colace (docusate sodium) stool softener  once or twice a day to help keep the stool soft.  If you still are having problems with constipation, you may take Miralax once daily as needed to help keep your bowels regular.   Home Blood Pressure Monitoring for Patients   Your provider has recommended that you check your blood pressure (BP) at least once a week at home. If you do not have a blood pressure cuff at home, one will be provided for you. Contact your provider if you have not received your monitor within 1 week.   Helpful Tips for Accurate Home Blood Pressure Checks  Don't smoke, exercise, or drink caffeine 30 minutes before checking your BP Use the restroom before checking your BP (a full bladder can raise your pressure) Relax in a comfortable upright chair Feet on the ground Left arm resting comfortably on a flat surface at the level of your heart Legs uncrossed Back supported Sit quietly and don't talk Place the cuff on your bare arm Adjust snuggly, so that only two fingertips can fit between your skin and the top of the cuff Check 2 readings separated by at least one minute Keep a log of your BP readings For a visual, please reference this diagram: http://ccnc.care/bpdiagram  Provider Name: [facility]     Phone: [phone]  Zone 1: ALL CLEAR  Continue to monitor your symptoms:  BP reading is less than 140 (top number) or less than 90 (bottom  number)  No right upper stomach pain No headaches or seeing spots No feeling nauseated or throwing up No swelling in face and hands  Zone 2: CAUTION Call your doctor's office for any of the following:  BP reading is greater than 140 (top number) or greater than 90 (bottom number)  Stomach pain under your ribs in the middle or right side Headaches or seeing spots Feeling nauseated or throwing up Swelling in face and hands  Zone 3: EMERGENCY  Seek immediate medical care if you have any of the following:  BP reading is greater than160 (top number) or greater than  110 (bottom number) Severe headaches not improving with Tylenol Serious difficulty catching your breath Any worsening symptoms from Zone 2    First Trimester of Pregnancy The first trimester of pregnancy is from week 1 until the end of week 12 (months 1 through 3). A week after a sperm fertilizes an egg, the egg will implant on the wall of the uterus. This embryo will begin to develop into a baby. Genes from you and your partner are forming the baby. The female genes determine whether the baby is a boy or a girl. At 6-8 weeks, the eyes and face are formed, and the heartbeat can be seen on ultrasound. At the end of 12 weeks, all the baby's organs are formed.  Now that you are pregnant, you will want to do everything you can to have a healthy baby. Two of the most important things are to get good prenatal care and to follow your health care provider's instructions. Prenatal care is all the medical care you receive before the baby's birth. This care will help prevent, find, and treat any problems during the pregnancy and childbirth. BODY CHANGES Your body goes through many changes during pregnancy. The changes vary from woman to woman.  You may gain or lose a couple of pounds at first. You may feel sick to your stomach (nauseous) and throw up (vomit). If the vomiting is uncontrollable, call your health care provider. You may tire easily. You may develop headaches that can be relieved by medicines approved by your health care provider. You may urinate more often. Painful urination may mean you have a bladder infection. You may develop heartburn as a result of your pregnancy. You may develop constipation because certain hormones are causing the muscles that push waste through your intestines to slow down. You may develop hemorrhoids or swollen, bulging veins (varicose veins). Your breasts may begin to grow larger and become tender. Your nipples may stick out more, and the tissue that surrounds them  (areola) may become darker. Your gums may bleed and may be sensitive to brushing and flossing. Dark spots or blotches (chloasma, mask of pregnancy) may develop on your face. This will likely fade after the baby is born. Your menstrual periods will stop. You may have a loss of appetite. You may develop cravings for certain kinds of food. You may have changes in your emotions from day to day, such as being excited to be pregnant or being concerned that something may go wrong with the pregnancy and baby. You may have more vivid and strange dreams. You may have changes in your hair. These can include thickening of your hair, rapid growth, and changes in texture. Some women also have hair loss during or after pregnancy, or hair that feels dry or thin. Your hair will most likely return to normal after your baby is born. WHAT TO EXPECT AT YOUR PRENATAL  VISITS During a routine prenatal visit: You will be weighed to make sure you and the baby are growing normally. Your blood pressure will be taken. Your abdomen will be measured to track your baby's growth. The fetal heartbeat will be listened to starting around week 10 or 12 of your pregnancy. Test results from any previous visits will be discussed. Your health care provider may ask you: How you are feeling. If you are feeling the baby move. If you have had any abnormal symptoms, such as leaking fluid, bleeding, severe headaches, or abdominal cramping. If you have any questions. Other tests that may be performed during your first trimester include: Blood tests to find your blood type and to check for the presence of any previous infections. They will also be used to check for low iron levels (anemia) and Rh antibodies. Later in the pregnancy, blood tests for diabetes will be done along with other tests if problems develop. Urine tests to check for infections, diabetes, or protein in the urine. An ultrasound to confirm the proper growth and development  of the baby. An amniocentesis to check for possible genetic problems. Fetal screens for spina bifida and Down syndrome. You may need other tests to make sure you and the baby are doing well. HOME CARE INSTRUCTIONS  Medicines Follow your health care provider's instructions regarding medicine use. Specific medicines may be either safe or unsafe to take during pregnancy. Take your prenatal vitamins as directed. If you develop constipation, try taking a stool softener if your health care provider approves. Diet Eat regular, well-balanced meals. Choose a variety of foods, such as meat or vegetable-based protein, fish, milk and low-fat dairy products, vegetables, fruits, and whole grain breads and cereals. Your health care provider will help you determine the amount of weight gain that is right for you. Avoid raw meat and uncooked cheese. These carry germs that can cause birth defects in the baby. Eating four or five small meals rather than three large meals a day may help relieve nausea and vomiting. If you start to feel nauseous, eating a few soda crackers can be helpful. Drinking liquids between meals instead of during meals also seems to help nausea and vomiting. If you develop constipation, eat more high-fiber foods, such as fresh vegetables or fruit and whole grains. Drink enough fluids to keep your urine clear or pale yellow. Activity and Exercise Exercise only as directed by your health care provider. Exercising will help you: Control your weight. Stay in shape. Be prepared for labor and delivery. Experiencing pain or cramping in the lower abdomen or low back is a good sign that you should stop exercising. Check with your health care provider before continuing normal exercises. Try to avoid standing for long periods of time. Move your legs often if you must stand in one place for a long time. Avoid heavy lifting. Wear low-heeled shoes, and practice good posture. You may continue to have sex  unless your health care provider directs you otherwise. Relief of Pain or Discomfort Wear a good support bra for breast tenderness.   Take warm sitz baths to soothe any pain or discomfort caused by hemorrhoids. Use hemorrhoid cream if your health care provider approves.   Rest with your legs elevated if you have leg cramps or low back pain. If you develop varicose veins in your legs, wear support hose. Elevate your feet for 15 minutes, 3-4 times a day. Limit salt in your diet. Prenatal Care Schedule your prenatal visits by the  twelfth week of pregnancy. They are usually scheduled monthly at first, then more often in the last 2 months before delivery. Write down your questions. Take them to your prenatal visits. Keep all your prenatal visits as directed by your health care provider. Safety Wear your seat belt at all times when driving. Make a list of emergency phone numbers, including numbers for family, friends, the hospital, and police and fire departments. General Tips Ask your health care provider for a referral to a local prenatal education class. Begin classes no later than at the beginning of month 6 of your pregnancy. Ask for help if you have counseling or nutritional needs during pregnancy. Your health care provider can offer advice or refer you to specialists for help with various needs. Do not use hot tubs, steam rooms, or saunas. Do not douche or use tampons or scented sanitary pads. Do not cross your legs for long periods of time. Avoid cat litter boxes and soil used by cats. These carry germs that can cause birth defects in the baby and possibly loss of the fetus by miscarriage or stillbirth. Avoid all smoking, herbs, alcohol, and medicines not prescribed by your health care provider. Chemicals in these affect the formation and growth of the baby. Schedule a dentist appointment. At home, brush your teeth with a soft toothbrush and be gentle when you floss. SEEK MEDICAL CARE IF:   You have dizziness. You have mild pelvic cramps, pelvic pressure, or nagging pain in the abdominal area. You have persistent nausea, vomiting, or diarrhea. You have a bad smelling vaginal discharge. You have pain with urination. You notice increased swelling in your face, hands, legs, or ankles. SEEK IMMEDIATE MEDICAL CARE IF:  You have a fever. You are leaking fluid from your vagina. You have spotting or bleeding from your vagina. You have severe abdominal cramping or pain. You have rapid weight gain or loss. You vomit blood or material that looks like coffee grounds. You are exposed to [place] measles and have never had them. You are exposed to fifth disease or chickenpox. You develop a severe headache. You have shortness of breath. You have any kind of trauma, such as from a fall or a car accident. Document Released: 08/20/2001 Document Revised: 01/10/2014 Document Reviewed: 07/06/2013 [facility] Patient Information 2015 [place], [place]. This information is not intended to replace advice given to you by your health care provider. Make sure you discuss any questions you have with your health care provider.

## 2021-03-14 NOTE — Progress Notes (Signed)
[place] 12+1 wks,measurements c/w dates,CRL 59.42 mm,fhr 166 bpm,NT 1.3 mm,NB present,posterior placenta

## 2021-03-14 NOTE — Progress Notes (Signed)
INITIAL OBSTETRICAL VISIT Patient name: Sharon Nash MRN [id number]  Date of birth: 05-22-1996 Chief Complaint:   Initial Prenatal Visit (Nt/it, decreased appetite)  History of Present Illness:   Sharon Nash is a 25 y.o. [phone] African-American female at [redacted]w[redacted]d by Sharon Nash at 6 weeks with an Estimated Date of Delivery: 09/25/21 being seen today for her initial obstetrical visit.   Patient's last menstrual period was 11/23/2020 (approximate). Her obstetrical history is significant for  term uncomplicated svb x 2, SAB x 1 .   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  Last pap 12/31/17. Results were: NILM w/ HRHPV not done  Depression screen [facility] 2/9 03/14/2021 12/31/2017  Decreased Interest 2 0  Down, Depressed, Hopeless 0 1  PHQ - 2 Score 2 1  Altered sleeping 2 -  Tired, decreased energy 1 -  Change in appetite 2 -  Feeling bad or failure about yourself  0 -  Trouble concentrating 0 -  Moving slowly or fidgety/restless 0 -  Suicidal thoughts 0 -  PHQ-9 Score 7 -     GAD 7 : Generalized Anxiety Score 03/14/2021  Nervous, Anxious, on Edge 2  Control/stop worrying 1  Worry too much - different things 1  Trouble relaxing 1  Restless 1  Easily annoyed or irritable 1  Afraid - awful might happen 0  Total GAD 7 Score 7     Review of Systems:   Pertinent items are noted in HPI Denies cramping/contractions, leakage of fluid, vaginal bleeding, abnormal vaginal discharge w/ itching/odor/irritation, headaches, visual changes, shortness of breath, chest pain, abdominal pain, severe nausea/vomiting, or problems with urination or bowel movements unless otherwise stated above.  Pertinent History Reviewed:  Reviewed past medical,surgical, social, obstetrical and family history.  Reviewed problem list, medications and allergies. OB History  Gravida Para Term Preterm AB Living  4 2 2  0 1 2  SAB IAB Ectopic Multiple Live  Births  1 0 0 0 2    # Outcome Date GA Lbr Len/2nd Weight Sex Delivery Anes PTL Lv  4 Current           3 SAB 06/14/20 [redacted]w[redacted]d         2 Term 03/02/16 [redacted]w[redacted]d 07:33 / 00:29 7 lb 4.8 oz (3.31 kg) F Vag-Spont Other N LIV  1 Term 02/19/11 [redacted]w[redacted]d  7 lb 6 oz (3.345 kg) M Vag-Spont  N LIV   Physical Assessment:   Vitals:   03/14/21 1102  BP: 123/73  Pulse: 79  Weight: 203 lb (92.1 kg)  Body mass index is 31.79 kg/m.       Physical Examination:  General appearance - well appearing, and in no distress  Mental status - alert, oriented to person, place, and time  Psych:  She has a normal mood and affect  Skin - warm and dry, normal color, no suspicious lesions noted  Chest - effort normal, all lung fields clear to auscultation bilaterally  Heart - normal rate and regular rhythm  Abdomen - soft, nontender  Extremities:  No swelling or varicosities noted  Pelvic - VULVA: normal appearing vulva with no masses, tenderness or lesions  VAGINA: normal appearing vagina with normal color, thin white bubbly d/c (denies itching/odor/irritation), no lesions  CERVIX: normal appearing cervix without discharge or lesions, no CMT  Thin prep pap is done w/ HR HPV cotesting  Chaperone: 05/15/21    TODAY'S NT  Sharon Nash 12+1 wks,measurements c/w dates,CRL 59.42 mm,fhr 166 bpm,NT 1.3 mm,NB present,posterior placenta    Results for orders placed or performed in visit on 03/14/21 (from the past 24 hour(s))  POC Urinalysis Dipstick OB   Collection Time: 03/14/21 11:43 AM  Result Value Ref Range   Color, UA     Clarity, UA     Glucose, UA Negative Negative   Bilirubin, UA     Ketones, UA neg    Spec Grav, UA     Blood, UA neg    pH, UA     POC,PROTEIN,UA Negative Negative, Trace, Small (1+), Moderate (2+), Large (3+), 4+   Urobilinogen, UA     Nitrite, UA neg    Leukocytes, UA Negative Negative   Appearance     Odor      Assessment & Plan:  1) Low-Risk Pregnancy [id number] at [redacted]w[redacted]d with an Estimated  Date of Delivery: 09/25/21   2) Initial OB visit  3) Nausea & decreased appetite> has phenergan, rx diclegis  4) THC use> trying to quit, but no appetite and nausea- see if diclegis helps. Advised THC cessation.   Meds:  Meds ordered this encounter  Medications   Blood Pressure Monitor MISC    Sig: For regular home bp monitoring during pregnancy    Dispense:  1 each    Refill:  0    Z34.81 Please mail to patient   Doxylamine-Pyridoxine (DICLEGIS) 10-10 MG TBEC    Sig: 2 tabs q hs, if sx persist add 1 tab q am on day 3, if sx persist add 1 tab q afternoon on day 4    Dispense:  100 tablet    Refill:  6    Order Specific Question:   Supervising Provider    Answer:   Duane Lope H [[id number]]    Initial labs obtained Continue prenatal vitamins Reviewed n/v relief measures and warning s/s to report Reviewed recommended weight gain based on pre-gravid BMI Encouraged well-balanced diet Genetic & carrier screening discussed: requests Panorama, NT/IT, and Horizon ,  Ultrasound discussed; fetal survey: requested CCNC completed> form faxed if has or is planning to apply for medicaid The nature of [employer] for [employer] with multiple MDs and other Advanced Practice Providers was explained to patient; also emphasized that fellows, residents, and students are part of our team. Does not have home bp cuff. Office bp cuff given: no. Rx sent: yes. Check bp weekly, let Sharon Nash know if consistently >140/90.   Follow-up: Return in about 4 weeks (around 04/11/2021) for LROB, 2nd IT, CNM, in person.   Orders Placed This Encounter  Procedures   Urine Culture   Integrated 1   Genetic Screening   CBC/D/Plt+RPR+Rh+ABO+RubIgG...   Pain Management Screening Profile (10S)   POC Urinalysis Dipstick OB    Cheral Marker CNM, [facility] 03/14/2021 11:49 AM

## 2021-03-15 LAB — PMP SCREEN PROFILE (10S), URINE
Amphetamine Scrn, Ur: NEGATIVE ng/mL
BARBITURATE SCREEN URINE: NEGATIVE ng/mL
BENZODIAZEPINE SCREEN, URINE: NEGATIVE ng/mL
CANNABINOIDS UR QL SCN: POSITIVE ng/mL — AB
Cocaine (Metab) Scrn, Ur: NEGATIVE ng/mL
Creatinine(Crt), U: 103.5 mg/dL (ref 20.0–300.0)
Methadone Screen, Urine: NEGATIVE ng/mL
OXYCODONE+OXYMORPHONE UR QL SCN: NEGATIVE ng/mL
Opiate Scrn, Ur: NEGATIVE ng/mL
Ph of Urine: 7.9 (ref 4.5–8.9)
Phencyclidine Qn, Ur: NEGATIVE ng/mL
Propoxyphene Scrn, Ur: NEGATIVE ng/mL

## 2021-03-16 LAB — CBC/D/PLT+RPR+RH+ABO+RUBIGG...
Antibody Screen: NEGATIVE
Basophils Absolute: 0 10*3/uL (ref 0.0–0.2)
Basos: 0 %
EOS (ABSOLUTE): 0.2 10*3/uL (ref 0.0–0.4)
Eos: 4 %
HCV Ab: <0.1 s/co ratio (ref 0.0–0.9)
HIV Screen 4th Generation wRfx: NONREACTIVE
Hematocrit: 35.5 % (ref 34.0–46.6)
Hemoglobin: 11.9 g/dL (ref 11.1–15.9)
Hepatitis B Surface Ag: NEGATIVE
Immature Grans (Abs): 0 10*3/uL (ref 0.0–0.1)
Immature Granulocytes: 0 %
Lymphocytes Absolute: 1.4 10*3/uL (ref 0.7–3.1)
Lymphs: 35 %
MCH: 29.7 pg (ref 26.6–33.0)
MCHC: 33.5 g/dL (ref 31.5–35.7)
MCV: 89 fL (ref 79–97)
Monocytes Absolute: 0.3 10*3/uL (ref 0.1–0.9)
Monocytes: 8 %
Neutrophils Absolute: 2.1 10*3/uL (ref 1.4–7.0)
Neutrophils: 53 %
Platelets: 331 10*3/uL (ref 150–450)
RBC: 4.01 x10E6/uL (ref 3.77–5.28)
RDW: 12.5 % (ref 11.7–15.4)
RPR Ser Ql: NONREACTIVE
Rh Factor: NEGATIVE
Rubella Antibodies, IGG: 8.35 index (ref >0.99)
WBC: 4 10*3/uL (ref 3.4–10.8)

## 2021-03-16 LAB — URINE CULTURE: Organism ID, Bacteria: NO GROWTH

## 2021-03-16 LAB — INTEGRATED 1
Crown Rump Length: 59.4 mm
Gest. Age on Collection Date: 12.3 weeks
Maternal Age at EDD: 26.9 yr
Nuchal Translucency (NT): 1.3 mm
Number of Fetuses: 1
PAPP-A Value: 165.2 ng/mL
Weight: 203 [lb_av]

## 2021-03-16 LAB — HCV INTERPRETATION

## 2021-03-20 ENCOUNTER — Encounter: Payer: Self-pay | Admitting: Women's Health

## 2021-03-20 DIAGNOSIS — R87619 Unspecified abnormal cytological findings in specimens from cervix uteri: Secondary | ICD-10-CM | POA: Insufficient documentation

## 2021-03-20 LAB — CYTOLOGY - PAP
Chlamydia: NEGATIVE
Comment: NEGATIVE
Comment: NEGATIVE
Comment: NORMAL
Diagnosis: UNDETERMINED — AB
High risk HPV: NEGATIVE
Neisseria Gonorrhea: NEGATIVE

## 2021-03-23 ENCOUNTER — Telehealth: Payer: Self-pay

## 2021-03-23 NOTE — Telephone Encounter (Signed)
Called pt to review pap smear results since she had not yet seen her results in Mychart. Pt confirmed understanding.

## 2021-03-26 ENCOUNTER — Telehealth: Payer: Self-pay | Admitting: *Deleted

## 2021-03-26 NOTE — Telephone Encounter (Signed)
Pt aware of abnormal pap and needs to repeat in 3 years. Pt voiced understanding. JSY

## 2021-03-26 NOTE — Telephone Encounter (Signed)
Left message @ 10:13 am regarding pap smear. JSY

## 2021-03-28 ENCOUNTER — Encounter: Payer: Self-pay | Admitting: Women's Health

## 2021-03-28 DIAGNOSIS — O285 Abnormal chromosomal and genetic finding on antenatal screening of mother: Secondary | ICD-10-CM | POA: Insufficient documentation

## 2021-04-05 DIAGNOSIS — H5213 Myopia, bilateral: Secondary | ICD-10-CM | POA: Diagnosis not present

## 2021-04-11 ENCOUNTER — Other Ambulatory Visit: Payer: Self-pay

## 2021-04-11 ENCOUNTER — Ambulatory Visit (INDEPENDENT_AMBULATORY_CARE_PROVIDER_SITE_OTHER): Payer: Medicaid Other | Admitting: Advanced Practice Midwife

## 2021-04-11 ENCOUNTER — Encounter: Payer: Self-pay | Admitting: Advanced Practice Midwife

## 2021-04-11 VITALS — BP 108/75 | HR 99 | Wt 208.5 lb

## 2021-04-11 DIAGNOSIS — Z3A16 16 weeks gestation of pregnancy: Secondary | ICD-10-CM

## 2021-04-11 DIAGNOSIS — Z1379 Encounter for other screening for genetic and chromosomal anomalies: Secondary | ICD-10-CM | POA: Diagnosis not present

## 2021-04-11 DIAGNOSIS — Z3480 Encounter for supervision of other normal pregnancy, unspecified trimester: Secondary | ICD-10-CM

## 2021-04-11 DIAGNOSIS — Z363 Encounter for antenatal screening for malformations: Secondary | ICD-10-CM

## 2021-04-11 NOTE — Patient Instructions (Signed)
Sharon Nash, thank you for choosing our office today! We appreciate the opportunity to meet your healthcare needs. You may receive a short survey by mail, e-mail, or through [employer]. If you are happy with your care we would appreciate if you could take just a few minutes to complete the survey questions. We read all of your comments and take your feedback very seriously. Thank you again for choosing our office.  Center for [employer] Team at [facility] at [facility] ([address] [place], [place] [address]) Entrance C, located off of [address] Free 24/7 valet parking  Go to [email or web address] to register for FREE online childbirth classes  Call the office [phone]) or go to [facility] if: You begin to severe cramping Your water breaks.  Sometimes it is a big gush of fluid, sometimes it is just a trickle that keeps getting your panties wet or running down your legs You have vaginal bleeding.  It is normal to have a small amount of spotting if your cervix was checked.   [facility] Pediatricians/Family Doctors [facility]): [address] Colette Ribas, [phone]           [facility]: [address], [phone]                [facility]): [address], [phone] (call to ask if accepting patients) [facility] Department: [address], [place], [phone]    [facility] Pediatricians/Family Doctors [facility]): [phone] [address], [phone] [facility]: [address] [place], [phone] [facility]: [address], [phone]  [facility] Doctors  [facility]): [phone] [facility]: [address], [phone]   [facility]: [address] [phone]  [facility] Doctors  [facility]: [phone], [phone]  Home Blood Pressure Monitoring for Patients   Your provider has recommended that you check your blood pressure (BP) at least once a week at home. If you do not have a blood pressure cuff at home, one will be provided for you. Contact your provider if you have not received your monitor within 1 week.   Helpful Tips for Accurate Home Blood Pressure Checks  Don't smoke, exercise, or drink caffeine 30 minutes before checking your BP Use the restroom before checking your BP (a full bladder can raise your pressure) Relax in a comfortable upright chair Feet on the ground Left arm resting comfortably on a flat surface at the level of your heart Legs uncrossed Back supported Sit quietly and don't talk Place the cuff on your bare arm Adjust snuggly, so that only two fingertips can fit between your skin and the top of the cuff Check 2 readings separated by at least one minute Keep a log of your BP readings For a visual, please reference this diagram: http://ccnc.care/bpdiagram  Provider Name: [facility]     Phone: [phone]  Zone 1: ALL CLEAR  Continue to monitor your symptoms:  BP reading is less than 140 (top number) or less than 90 (bottom number)  No right upper stomach pain No headaches or seeing spots No feeling nauseated or throwing up No swelling in face and hands  Zone 2: CAUTION Call your doctor's office for any of the following:  BP reading is greater than 140 (top number) or greater than  90 (bottom number)  Stomach pain under your ribs in the middle or right side Headaches or seeing spots Feeling nauseated or throwing up Swelling in face and hands  Zone 3: EMERGENCY  Seek immediate medical care if you have any of the following:  BP reading is greater than160 (top number) or greater than 110 (bottom number) Severe headaches not improving with Tylenol Serious difficulty catching your breath Any worsening symptoms from  Zone 2     Second Trimester of Pregnancy The second trimester is from week 14 through week 27 (months 4 through 6). The second trimester is often a time when you feel your best. Your body has adjusted to being pregnant, and you begin to feel better physically. Usually, morning sickness has lessened or quit completely, you may have more energy, and you may have an increase in appetite. The second trimester is also a time when the fetus is growing rapidly. At the end of the sixth month, the fetus is about 9 inches long and weighs about 1 pounds. You will likely begin to feel the baby move (quickening) between 16 and 20 weeks of pregnancy. Body changes during your second trimester Your body continues to go through many changes during your second trimester. The changes vary from woman to woman. Your weight will continue to increase. You will notice your lower abdomen bulging out. You may begin to get stretch marks on your hips, abdomen, and breasts. You may develop headaches that can be relieved by medicines. The medicines should be approved by your health care provider. You may urinate more often because the fetus is pressing on your bladder. You may develop or continue to have heartburn as a result of your pregnancy. You may develop constipation because certain hormones are causing the muscles that push waste through your intestines to slow down. You may develop hemorrhoids or swollen, bulging veins (varicose veins). You may have back pain. This is caused by: Weight gain. Pregnancy hormones that are relaxing the joints in your pelvis. A shift in weight and the muscles that support your balance. Your breasts will continue to grow and they will continue to become tender. Your gums may bleed and may be sensitive to brushing and flossing. Dark spots or blotches (chloasma, mask of pregnancy) may develop on your face. This will likely fade after the baby is born. A dark line from your belly button to  the pubic area (linea nigra) may appear. This will likely fade after the baby is born. You may have changes in your hair. These can include thickening of your hair, rapid growth, and changes in texture. Some women also have hair loss during or after pregnancy, or hair that feels dry or thin. Your hair will most likely return to normal after your baby is born.  What to expect at prenatal visits During a routine prenatal visit: You will be weighed to make sure you and the fetus are growing normally. Your blood pressure will be taken. Your abdomen will be measured to track your baby's growth. The fetal heartbeat will be listened to. Any test results from the previous visit will be discussed.  Your health care provider may ask you: How you are feeling. If you are feeling the baby move. If you have had any abnormal symptoms, such as leaking fluid, bleeding, severe headaches, or abdominal cramping. If you are using any tobacco products, including cigarettes, chewing tobacco, and electronic cigarettes. If you have any questions.  Other tests that may be performed during   your second trimester include: Blood tests that check for: Low iron levels (anemia). High blood sugar that affects pregnant women (gestational diabetes) between 24 and 28 weeks. Rh antibodies. This is to check for a protein on red blood cells (Rh factor). Urine tests to check for infections, diabetes, or protein in the urine. An ultrasound to confirm the proper growth and development of the baby. An amniocentesis to check for possible genetic problems. Fetal screens for spina bifida and Down syndrome. HIV (human immunodeficiency virus) testing. Routine prenatal testing includes screening for HIV, unless you choose not to have this test.  Follow these instructions at home: Medicines Follow your health care provider's instructions regarding medicine use. Specific medicines may be either safe or unsafe to take during  pregnancy. Take a prenatal vitamin that contains at least 600 micrograms (mcg) of folic acid. If you develop constipation, try taking a stool softener if your health care provider approves. Eating and drinking Eat a balanced diet that includes fresh fruits and vegetables, whole grains, good sources of protein such as meat, eggs, or tofu, and low-fat dairy. Your health care provider will help you determine the amount of weight gain that is right for you. Avoid raw meat and uncooked cheese. These carry germs that can cause birth defects in the baby. If you have low calcium intake from food, talk to your health care provider about whether you should take a daily calcium supplement. Limit foods that are high in fat and processed sugars, such as fried and sweet foods. To prevent constipation: Drink enough fluid to keep your urine clear or pale yellow. Eat foods that are high in fiber, such as fresh fruits and vegetables, whole grains, and beans. Activity Exercise only as directed by your health care provider. Most women can continue their usual exercise routine during pregnancy. Try to exercise for 30 minutes at least 5 days a week. Stop exercising if you experience uterine contractions. Avoid heavy lifting, wear low heel shoes, and practice good posture. A sexual relationship may be continued unless your health care provider directs you otherwise. Relieving pain and discomfort Wear a good support bra to prevent discomfort from breast tenderness. Take warm sitz baths to soothe any pain or discomfort caused by hemorrhoids. Use hemorrhoid cream if your health care provider approves. Rest with your legs elevated if you have leg cramps or low back pain. If you develop varicose veins, wear support hose. Elevate your feet for 15 minutes, 3-4 times a day. Limit salt in your diet. Prenatal Care Write down your questions. Take them to your prenatal visits. Keep all your prenatal visits as told by your health  care provider. This is important. Safety Wear your seat belt at all times when driving. Make a list of emergency phone numbers, including numbers for family, friends, the hospital, and police and fire departments. General instructions Ask your health care provider for a referral to a local prenatal education class. Begin classes no later than the beginning of month 6 of your pregnancy. Ask for help if you have counseling or nutritional needs during pregnancy. Your health care provider can offer advice or refer you to specialists for help with various needs. Do not use hot tubs, steam rooms, or saunas. Do not douche or use tampons or scented sanitary pads. Do not cross your legs for long periods of time. Avoid cat litter boxes and soil used by cats. These carry germs that can cause birth defects in the baby and possibly loss of the   fetus by miscarriage or stillbirth. Avoid all smoking, herbs, alcohol, and unprescribed drugs. Chemicals in these products can affect the formation and growth of the baby. Do not use any products that contain nicotine or tobacco, such as cigarettes and e-cigarettes. If you need help quitting, ask your health care provider. Visit your dentist if you have not gone yet during your pregnancy. Use a soft toothbrush to brush your teeth and be gentle when you floss. Contact a health care provider if: You have dizziness. You have mild pelvic cramps, pelvic pressure, or nagging pain in the abdominal area. You have persistent nausea, vomiting, or diarrhea. You have a bad smelling vaginal discharge. You have pain when you urinate. Get help right away if: You have a fever. You are leaking fluid from your vagina. You have spotting or bleeding from your vagina. You have severe abdominal cramping or pain. You have rapid weight gain or weight loss. You have shortness of breath with chest pain. You notice sudden or extreme swelling of your face, hands, ankles, feet, or legs. You  have not felt your baby move in over an hour. You have severe headaches that do not go away when you take medicine. You have vision changes. Summary The second trimester is from week 14 through week 27 (months 4 through 6). It is also a time when the fetus is growing rapidly. Your body goes through many changes during pregnancy. The changes vary from woman to woman. Avoid all smoking, herbs, alcohol, and unprescribed drugs. These chemicals affect the formation and growth your baby. Do not use any tobacco products, such as cigarettes, chewing tobacco, and e-cigarettes. If you need help quitting, ask your health care provider. Contact your health care provider if you have any questions. Keep all prenatal visits as told by your health care provider. This is important. This information is not intended to replace advice given to you by your health care provider. Make sure you discuss any questions you have with your health care provider. Document Released: 08/20/2001 Document Revised: 02/01/2016 Document Reviewed: 10/27/2012 Elsevier Interactive Patient Education  2017 Elsevier Inc.  

## 2021-04-11 NOTE — Progress Notes (Signed)
   LOW-RISK PREGNANCY VISIT Patient name: Sharon Nash MRN [id number]  Date of birth: [birth year] Chief Complaint:   Routine Prenatal Visit (2nd IT)  History of Present Illness:   Sharon Nash is a 25 y.o. [phone] female at [redacted]w[redacted]d with an Estimated Date of Delivery: 09/25/21 being seen today for ongoing management of a low-risk pregnancy.  Today she reports no complaints. Contractions: Not present. Vag. Bleeding: None.   . denies leaking of fluid. Review of Systems:   Pertinent items are noted in HPI Denies abnormal vaginal discharge w/ itching/odor/irritation, headaches, visual changes, shortness of breath, chest pain, abdominal pain, severe nausea/vomiting, or problems with urination or bowel movements unless otherwise stated above. Pertinent History Reviewed:  Reviewed past medical,surgical, social, obstetrical and family history.  Reviewed problem list, medications and allergies. Physical Assessment:   Vitals:   04/11/21 1355  BP: 108/75  Pulse: 99  Weight: 208 lb 8 oz (94.6 kg)  Body mass index is 32.66 kg/m.        Physical Examination:   General appearance: Well appearing, and in no distress  Mental status: Alert, oriented to person, place, and time  Skin: Warm & dry  Cardiovascular: Normal heart rate noted  Respiratory: Normal respiratory effort, no distress  Abdomen: Soft, gravid, nontender  Pelvic: Cervical exam deferred         Extremities: Edema: None  Fetal Status: Fetal Heart Rate (bpm): 159        No results found for this or any previous visit (from the past 24 hour(s)).  Assessment & Plan:  1) Low-risk pregnancy [id number] at [redacted]w[redacted]d with an Estimated Date of Delivery: 09/25/21   2) Rh neg, plan for Rhogam ~28wks   Meds: No orders of the defined types were placed in this encounter.  Labs/procedures today: 2nd IT  Plan:  Continue routine obstetrical care   Reviewed: Preterm labor symptoms and general obstetric precautions including but not limited to  vaginal bleeding, contractions, leaking of fluid and fetal movement were reviewed in detail with the patient.  All questions were answered. Didn't ask about home bp cuff.  Check bp weekly, let us know if >140/90.   Follow-up: Return for LROB, [place]: Anatomy 3-4wks.  Orders Placed This Encounter  Procedures   US OB Comp + 14 Wk   INTEGRATED 2   Arabella Merles [facility] 04/11/2021 2:13 PM

## 2021-04-13 LAB — INTEGRATED 2
AFP MoM: 1.4
Alpha-Fetoprotein: 40.9 ng/mL
Crown Rump Length: 59.4 mm
DIA MoM: 2.7
DIA Value: 340.4 pg/mL
Estriol, Unconjugated: 0.95 ng/mL
Gest. Age on Collection Date: 12.3 weeks
Gestational Age: 16.3 weeks
Maternal Age at EDD: 26.9 yr
Nuchal Translucency (NT): 1.3 mm
Nuchal Translucency MoM: 0.98
Number of Fetuses: 1
PAPP-A MoM: 0.26
PAPP-A Value: 165.2 ng/mL
Test Results:: NEGATIVE
Weight: 203 [lb_av]
Weight: 209 [lb_av]
hCG MoM: 0.89
hCG Value: 24.5 IU/mL
uE3 MoM: 1

## 2021-04-23 ENCOUNTER — Encounter: Payer: Self-pay | Admitting: Women's Health

## 2021-04-25 DIAGNOSIS — J45901 Unspecified asthma with (acute) exacerbation: Secondary | ICD-10-CM | POA: Diagnosis not present

## 2021-04-25 DIAGNOSIS — R109 Unspecified abdominal pain: Secondary | ICD-10-CM | POA: Diagnosis not present

## 2021-04-25 DIAGNOSIS — R1011 Right upper quadrant pain: Secondary | ICD-10-CM | POA: Diagnosis not present

## 2021-04-25 DIAGNOSIS — Z20822 Contact with and (suspected) exposure to covid-19: Secondary | ICD-10-CM | POA: Diagnosis not present

## 2021-04-25 DIAGNOSIS — R0602 Shortness of breath: Secondary | ICD-10-CM | POA: Diagnosis not present

## 2021-04-25 DIAGNOSIS — O99512 Diseases of the respiratory system complicating pregnancy, second trimester: Secondary | ICD-10-CM | POA: Diagnosis not present

## 2021-05-09 ENCOUNTER — Encounter: Payer: Medicaid Other | Admitting: Women's Health

## 2021-05-09 ENCOUNTER — Other Ambulatory Visit: Payer: Medicaid Other

## 2021-05-15 ENCOUNTER — Encounter: Payer: Self-pay | Admitting: Emergency Medicine

## 2021-05-15 ENCOUNTER — Ambulatory Visit
Admission: EM | Admit: 2021-05-15 | Discharge: 2021-05-15 | Disposition: A | Discharge status: Home / Self Care | Payer: Medicaid Other | Attending: Family Medicine | Admitting: Family Medicine

## 2021-05-15 ENCOUNTER — Other Ambulatory Visit: Payer: Self-pay

## 2021-05-15 DIAGNOSIS — J4541 Moderate persistent asthma with (acute) exacerbation: Secondary | ICD-10-CM | POA: Diagnosis not present

## 2021-05-15 MED ORDER — ALBUTEROL SULFATE HFA 108 (90 BASE) MCG/ACT IN AERS
2.0000 | INHALATION_SPRAY | Freq: Once | RESPIRATORY_TRACT | Status: AC
  Administered 2021-05-15: 2 via RESPIRATORY_TRACT

## 2021-05-15 MED ORDER — ALBUTEROL SULFATE (5 MG/ML) 0.5% IN NEBU: 2.5000 mg | INHALATION_SOLUTION | Freq: Four times a day (QID) | RESPIRATORY_TRACT | 2 refills | Status: DC | PRN

## 2021-05-15 MED ORDER — ALBUTEROL SULFATE HFA 108 (90 BASE) MCG/ACT IN AERS: 2.0000 | INHALATION_SPRAY | RESPIRATORY_TRACT | 2 refills | Status: DC | PRN

## 2021-05-15 MED ORDER — METHYLPREDNISOLONE SODIUM SUCC 125 MG IJ SOLR
125.0000 mg | Freq: Once | INTRAMUSCULAR | Status: AC
  Administered 2021-05-15: 125 mg via INTRAMUSCULAR

## 2021-05-15 MED ORDER — BUDESONIDE-FORMOTEROL FUMARATE 80-4.5 MCG/ACT IN AERO: 2.0000 | INHALATION_SPRAY | Freq: Two times a day (BID) | RESPIRATORY_TRACT | 2 refills | Status: DC

## 2021-05-15 NOTE — ED Triage Notes (Addendum)
Pt states she has been wheezing x 3 days.  Out of neb meds, albuterol inhaler and Symbicort inhaler. Would like albuterol inhaler from here to take home tonight.  Pt is [redacted] weeks pregnant.

## 2021-05-16 NOTE — ED Provider Notes (Signed)
[facility]   [id number] 05/15/21 Arrival Time: 1923  ASSESSMENT & PLAN:  1. Moderate persistent asthma with acute exacerbation    No indication for chest imaging.  Meds ordered this encounter  Medications   albuterol (PROVENTIL) (5 MG/ML) 0.5% nebulizer solution    Sig: Take 0.5 mLs (2.5 mg total) by nebulization every 6 (six) hours as needed for wheezing or shortness of breath.    Dispense:  20 mL    Refill:  2   albuterol (VENTOLIN HFA) 108 (90 Base) MCG/ACT inhaler    Sig: Inhale 2 puffs into the lungs every 4 (four) hours as needed for wheezing or shortness of breath.    Dispense:  18 g    Refill:  2   budesonide-formoterol (SYMBICORT) 80-4.5 MCG/ACT inhaler    Sig: Inhale 2 puffs into the lungs 2 (two) times daily. To prevent wheezing    Dispense:  1 each    Refill:  2   methylPREDNISolone sodium succinate (SOLU-MEDROL) 125 mg/2 mL injection 125 mg   albuterol (VENTOLIN HFA) 108 (90 Base) MCG/ACT inhaler 2 puff    Asthma precautions given. OTC symptom care as needed.  May f/u here as needed.  Reviewed expectations re: course of current medical issues. Questions answered. Outlined signs and symptoms indicating need for more acute intervention. Patient verbalized understanding. After Visit Summary given.  SUBJECTIVE: History from: patient.  Sharon Nash is a 25 y.o. female who presents with complaint of fairly persistent chest tightness and wheezing. Onset gradual,  past 3 d . Triggers: unknown. Describes wheezing as moderate to severe when present. Fever: no. Overall normal PO intake without n/v. Sick contacts: no. Ambulatory without difficulty. No LE edema. Typically her asthma is well controlled. Inhaler use: as needed; more frequently now. OTC treatment: none.  Social History   Tobacco Use  Smoking Status Never  Smokeless Tobacco Never    OBJECTIVE:  Vitals:   05/15/21 2017  BP: 112/73  Pulse: 84  Resp: 18  Temp: 98.3 F (36.8 C)   TempSrc: Oral  SpO2: 98%  Weight: 94.3 kg  Height: 5\' 7"  (1.702 m)     General appearance: alert; NAD HEENT: [place]; AT; without significant nasal congestion Neck: supple without LAD Cv: RRR without murmer Lungs: unlabored respirations, moderate bilateral insp and expiratory wheezing; cough: mild; no significant respiratory distress Skin: warm and dry Psychological: alert and cooperative; normal mood and affect   Allergies  Allergen Reactions   Shrimp [Shellfish Allergy] Anaphylaxis    Past Medical History:  Diagnosis Date   Asthma    Chronic abdominal pain    Chronic knee pain    Respiratory failure, acute (HCC) 06/2012   bipap only, not intubated, due to asthma exacerbation   Family History  Problem Relation Age of Onset   Asthma Sister    Breast cancer Maternal Aunt    Diabetes Maternal Grandmother    Hypertension Maternal Grandmother    Diabetes Maternal Grandfather    Asthma Maternal Grandfather    Colon cancer Neg Hx    Social History   Socioeconomic History   Marital status: Single    Spouse name: Not on file   Number of children: Not on file   Years of education: Not on file   Highest education level: Not on file  Occupational History   Not on file  Tobacco Use   Smoking status: Never   Smokeless tobacco: Never  Vaping Use   Vaping Use: Never used  Substance  and Sexual Activity   Alcohol use: Not Currently    Comment: occ   Drug use: Yes    Types: Marijuana   Sexual activity: Yes    Birth control/protection: None  Other Topics Concern   Not on file  Social History Narrative   Not on file   Social Determinants of Health   Financial Resource Strain: Low Risk    Difficulty of Paying Living Expenses: Not very hard  Food Insecurity: No Food Insecurity   Worried About Programme researcher, broadcasting/film/video in the Last Year: Never true   Ran Out of Food in the Last Year: Never true  Transportation Needs: No Transportation Needs   Lack of Transportation (Medical):  No   Lack of Transportation (Non-Medical): No  Physical Activity: Insufficiently Active   Days of Exercise per Week: 2 days   Minutes of Exercise per Session: 10 min  Stress: No Stress Concern Present   Feeling of Stress : Not at all  Social Connections: Moderately Isolated   Frequency of Communication with Friends and Family: More than three times a week   Frequency of Social Gatherings with Friends and Family: Twice a week   Attends Religious Services: 1 to 4 times per year   Active Member of [employer] or Organizations: No   Attends Banker Meetings: Never   Marital Status: Never married  Catering manager Violence: Not At Risk   Fear of Current or Ex-Partner: No   Emotionally Abused: No   Physically Abused: No   Sexually Abused: No             Mardella Layman, MD 05/16/21 [phone]

## 2021-05-31 ENCOUNTER — Other Ambulatory Visit: Payer: Self-pay

## 2021-05-31 ENCOUNTER — Encounter: Payer: Self-pay | Admitting: Family Medicine

## 2021-05-31 ENCOUNTER — Ambulatory Visit (INDEPENDENT_AMBULATORY_CARE_PROVIDER_SITE_OTHER): Payer: Medicaid Other

## 2021-05-31 ENCOUNTER — Ambulatory Visit (INDEPENDENT_AMBULATORY_CARE_PROVIDER_SITE_OTHER): Payer: Medicaid Other | Admitting: Family Medicine

## 2021-05-31 VITALS — BP 123/79 | HR 94 | Wt 220.0 lb

## 2021-05-31 DIAGNOSIS — Z3A23 23 weeks gestation of pregnancy: Secondary | ICD-10-CM

## 2021-05-31 DIAGNOSIS — Z363 Encounter for antenatal screening for malformations: Secondary | ICD-10-CM | POA: Diagnosis not present

## 2021-05-31 DIAGNOSIS — Z3480 Encounter for supervision of other normal pregnancy, unspecified trimester: Secondary | ICD-10-CM

## 2021-05-31 DIAGNOSIS — Z6791 Unspecified blood type, Rh negative: Secondary | ICD-10-CM

## 2021-05-31 DIAGNOSIS — O26899 Other specified pregnancy related conditions, unspecified trimester: Secondary | ICD-10-CM

## 2021-05-31 DIAGNOSIS — O285 Abnormal chromosomal and genetic finding on antenatal screening of mother: Secondary | ICD-10-CM

## 2021-05-31 NOTE — Progress Notes (Signed)
    Subjective:  Sharon Nash is a 25 y.o. [phone] at [redacted]w[redacted]d being seen today for ongoing prenatal care.  She is currently monitored for the following issues for this low-risk pregnancy and has Asthma exacerbation; Mild depression (HCC); Gastroesophageal reflux disease without esophagitis; Encounter for supervision of normal pregnancy, antepartum; Rh negative state in antepartum period; Marijuana use; Abnormal Pap smear of cervix; and Abnormal chromosomal and genetic finding on antenatal screening mother on their problem list.  Patient reports no complaints.  Breathing is doing good. Contractions: Not present. Vag. Bleeding: None.  Movement: Present. Denies leaking of fluid.   The following portions of the patient's history were reviewed and updated as appropriate: allergies, current medications, past family history, past medical history, past social history, past surgical history and problem list.   Objective:   Vitals:   05/31/21 1549  BP: 123/79  Pulse: 94  Weight: 220 lb (99.8 kg)    Fetal Status: Fetal Heart Rate (bpm): 145 Fundal Height: 23 cm Movement: Present     General:  Alert, oriented and cooperative. Patient is in no acute distress.  Skin: Skin is warm and dry. No rash noted.   Cardiovascular: Normal heart rate noted  Respiratory: Normal respiratory effort, no problems with respiration noted  Abdomen: Soft, gravid, appropriate for gestational age. Pain/Pressure: Absent     Pelvic:  Cervical exam deferred        Extremities: Normal range of motion.  Edema: None  Mental Status: Normal mood and affect. Normal behavior. Normal judgment and thought content.   Urinalysis:      Assessment and Plan:  Pregnancy: [id number] at [redacted]w[redacted]d  1. Supervision of other normal pregnancy, antepartum Doing well, normal fetal movement.   2. 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 Rh negative state in antepartum period RhoGAM  at 28 weeks.   4. [redacted] weeks gestation of pregnancy   Preterm labor symptoms and general obstetric precautions including but not limited to vaginal bleeding, contractions, leaking of fluid and fetal movement were reviewed in detail with the patient. Please refer to After Visit Summary for other counseling recommendations.  Return in about 4 weeks (around 06/28/2021) for LROB.   Allayne Stack, DO

## 2021-05-31 NOTE — Progress Notes (Signed)
[place] 23+2 wks,cephalic,cx 4.5 cm,posterior placenta gr 0,svp 6.6 cm,normal ovaries,FHR 155 bpm,EFW 541 g 24%,anatomy complete,no obvious abnormalities

## 2021-06-12 ENCOUNTER — Other Ambulatory Visit: Payer: Self-pay

## 2021-06-12 ENCOUNTER — Ambulatory Visit
Admission: EM | Admit: 2021-06-12 | Discharge: 2021-06-12 | Disposition: A | Discharge status: Home / Self Care | Payer: Medicaid Other | Attending: Emergency Medicine | Admitting: Emergency Medicine

## 2021-06-12 DIAGNOSIS — Z20822 Contact with and (suspected) exposure to covid-19: Secondary | ICD-10-CM | POA: Diagnosis not present

## 2021-06-12 DIAGNOSIS — R112 Nausea with vomiting, unspecified: Secondary | ICD-10-CM

## 2021-06-12 DIAGNOSIS — J4541 Moderate persistent asthma with (acute) exacerbation: Secondary | ICD-10-CM

## 2021-06-12 MED ORDER — ONDANSETRON 4 MG PO TBDP
4.0000 mg | ORAL_TABLET | Freq: Once | ORAL | Status: AC
  Administered 2021-06-12: 4 mg via ORAL

## 2021-06-12 MED ORDER — METOCLOPRAMIDE HCL 10 MG PO TABS: 10.0000 mg | ORAL_TABLET | Freq: Three times a day (TID) | ORAL | 0 refills | Status: DC | PRN

## 2021-06-12 MED ORDER — DEXAMETHASONE SODIUM PHOSPHATE 10 MG/ML IJ SOLN
10.0000 mg | Freq: Once | INTRAMUSCULAR | Status: AC
  Administered 2021-06-12: 10 mg via INTRAMUSCULAR

## 2021-06-12 MED ORDER — ALBUTEROL SULFATE HFA 108 (90 BASE) MCG/ACT IN AERS: 2.0000 | INHALATION_SPRAY | RESPIRATORY_TRACT | 2 refills | Status: DC | PRN

## 2021-06-12 NOTE — ED Provider Notes (Signed)
HPI  SUBJECTIVE:  Sharon Nash is a 25 y.o. female who presents with 1 week of cough, nasal congestion, rhinorrhea.  She reports wheezing, chest tightness, shortness of breath, dyspnea on exertion for the past few days.  No fevers, allergy symptoms.  Her daughter has identical symptoms.  She states that this is identical to previous asthma exacerbation and thinks that this was triggered by cold/upper respiratory infection.  She also reports nausea and vomiting today.  She reports watery stools  decreased urine output today.  She has had difficulty with this during her pregnancy.  She has been unable to tolerate any p.o. intake today.  She has been trying to push electrolyte containing fluids.  She has been taking Tylenol Cold and flu, Flonase, nasal vapor spray, albuterol 3 times daily.  She states she normally uses it as needed.  The albuterol helps very slightly.  No other aggravating or alleviating factors.  She has a past medical history of asthma and has been admitted and intubated for it.  Last steroid shot was here 4 weeks ago on 9/6 where she received an injection of Solu-Medrol.  She was sent home with Symbicort, albuterol inhaler and nebulizer solution.  She is currently [redacted] weeks pregnant.  She has had prenatal care, a confirmatory ultrasound, and states that the pregnancy is going well.  No contractions or leakage of fluid today.  PMD: Her OB/GYN   Past Medical History:  Diagnosis Date   Asthma    Chronic abdominal pain    Chronic knee pain    Respiratory failure, acute ([place]) 06/2012   bipap only, not intubated, due to asthma exacerbation    Past Surgical History:  Procedure Laterality Date   NO PAST SURGERIES     None      Family History  Problem Relation Age of Onset   Asthma Sister    Breast cancer Maternal Aunt    Diabetes Maternal Grandmother    Hypertension Maternal Grandmother    Diabetes Maternal Grandfather    Asthma Maternal Grandfather    Colon cancer Neg Hx      Social History   Tobacco Use   Smoking status: Never   Smokeless tobacco: Never  Vaping Use   Vaping Use: Never used  Substance Use Topics   Alcohol use: Not Currently    Comment: occ   Drug use: Yes    Types: Marijuana    No current facility-administered medications for this encounter.  Current Outpatient Medications:    metoCLOPramide (REGLAN) 10 MG tablet, Take 1 tablet (10 mg total) by mouth every 8 (eight) hours as needed for nausea., Disp: 30 tablet, Rfl: 0   acetaminophen (TYLENOL) 500 MG tablet, Take 1,000 mg by mouth every 6 (six) hours as needed., Disp: , Rfl:    albuterol (PROVENTIL) (5 MG/ML) 0.5% nebulizer solution, Take 0.5 mLs (2.5 mg total) by nebulization every 6 (six) hours as needed for wheezing or shortness of breath., Disp: 20 mL, Rfl: 2   albuterol (VENTOLIN HFA) 108 (90 Base) MCG/ACT inhaler, Inhale 2 puffs into the lungs every 4 (four) hours as needed for wheezing or shortness of breath., Disp: 18 g, Rfl: 2   Blood Pressure Monitor MISC, For regular home bp monitoring during pregnancy (Patient not taking: Reported on 04/11/2021), Disp: 1 each, Rfl: 0   budesonide-formoterol (SYMBICORT) 80-4.5 MCG/ACT inhaler, Inhale 2 puffs into the lungs 2 (two) times daily. To prevent wheezing, Disp: 1 each, Rfl: 2   cetirizine (ZYRTEC) 10 MG  tablet, Take 10 mg by mouth., Disp: , Rfl:    Doxylamine-Pyridoxine (DICLEGIS) 10-10 MG TBEC, 2 tabs q hs, if sx persist add 1 tab q am on day 3, if sx persist add 1 tab q afternoon on day 4 (Patient not taking: Reported on 04/11/2021), Disp: 100 tablet, Rfl: 6   fluticasone (FLONASE) 50 MCG/ACT nasal spray, Place into both nostrils daily., Disp: , Rfl:    promethazine (PHENERGAN) 25 MG tablet, Take 1 tablet (25 mg total) by mouth every 6 (six) hours as needed for nausea or vomiting., Disp: 30 tablet, Rfl: 1   Respiratory Therapy Supplies (NEBULIZER/TUBING/MOUTHPIECE) KIT, Use as directed with nebulizer, Disp: 1 kit, Rfl: 0  Allergies   Allergen Reactions   Shrimp [Shellfish Allergy] Anaphylaxis     ROS  As noted in HPI.   Physical Exam  BP 102/65 (BP Location: Right Arm)   Pulse 89   Temp 98.2 F (36.8 C) (Oral)   Resp 16   LMP 11/23/2020 (Approximate)   SpO2 97%   Constitutional: Well developed, well nourished, reporting nausea.  Patient started to vomit during interview Eyes:  EOMI, conjunctiva normal bilaterally HENT: Normocephalic, atraumatic,mucus membranes moist Respiratory: Normal inspiratory effort, poor air movement, faint scattered expiratory wheezing.  No anterior, lateral chest wall tenderness. Cardiovascular: Normal rate regular rhythm, no murmurs rubs or gallop GI: nondistended skin: No rash, skin intact Musculoskeletal: no deformities Neurologic: Alert & oriented x 3, no focal neuro deficits Psychiatric: Speech and behavior appropriate   ED Course   Medications  ondansetron (ZOFRAN-ODT) disintegrating tablet 4 mg (4 mg Oral Given 06/12/21 1802)  dexamethasone (DECADRON) injection 10 mg (10 mg Intramuscular Given 06/12/21 1802)    Orders Placed This Encounter  Procedures   Novel Coronavirus, NAA ([facility])    Standing Status:   Standing    Number of Occurrences:   1    No results found for this or any previous visit (from the past 24 hour(s)). No results found.  ED Clinical Impression  1. Moderate persistent asthma with acute exacerbation   2. Encounter for laboratory testing for COVID-19 virus   3. Nausea and vomiting, unspecified vomiting type      ED Assessment/Plan  Patient started vomiting while in the department.  She requests Zofran.  We do not have anything else to give her.  She states that she has been having issues with nausea and vomiting all day and is unable to keep anything down.  She is reporting decreased urine output.  States that she has taken Zofran before in pregnancy without any issue.  will give 4 mg of Zofran ODT here, but send home with Reglan 10 mg q 8  hr.  Push electrolyte containing fluids.  Patient with a moderate asthma exacerbation.  She does not appear to be in any respiratory distress, however, because she is vomiting, will give her dexamethasone 10 mg IM x1 for an asthma exacerbation.  will refill her albuterol inhaler.  She is to continue Flonase, saline nasal irrigation.  Regularly scheduled albuterol for the next 4 days, she may back off on it if she feels better sooner.  She does not need a spacer.  Follow-up with her OB/GYN in several days.  Strict ER return precautions given.   will check COVID although she is not a candidate for antiviral treatment.  Discussed MDM, treatment plan, and plan for follow-up with patient. Discussed sn/sx that should prompt return to the ED. patient agrees with plan.   Meds ordered  this encounter  Medications   ondansetron (ZOFRAN-ODT) disintegrating tablet 4 mg   dexamethasone (DECADRON) injection 10 mg   albuterol (VENTOLIN HFA) 108 (90 Base) MCG/ACT inhaler    Sig: Inhale 2 puffs into the lungs every 4 (four) hours as needed for wheezing or shortness of breath.    Dispense:  18 g    Refill:  2   metoCLOPramide (REGLAN) 10 MG tablet    Sig: Take 1 tablet (10 mg total) by mouth every 8 (eight) hours as needed for nausea.    Dispense:  30 tablet    Refill:  0      *This clinic note was created using [employer]. Therefore, there may be occasional mistakes despite careful proofreading.  ?    Melynda Ripple, MD 06/13/21 [phone]

## 2021-06-12 NOTE — Discharge Instructions (Addendum)
Try the Reglan.  It is more effective than the Phenergan.  Push electrolyte containing fluids.  Regularly scheduled albuterol, similar to schedule as your daughter is on.  I have given you a dose of steroids today.  You should not need any additional steroids.  Go immediately to the emergency department if you continue to throw up despite the Reglan, if you are needing your albuterol every hour, if you feel that you are getting tired of breathing or for any concerns.

## 2021-06-12 NOTE — ED Triage Notes (Signed)
Patient presents to Urgent Care with complaints of cough and congestion x 1 week. Pt has a hx of asthma. Treating with albuterol and neb meds.   Denies fever.

## 2021-06-13 LAB — SARS-COV-2, NAA 2 DAY TAT

## 2021-06-13 LAB — NOVEL CORONAVIRUS, NAA: SARS-CoV-2, NAA: DETECTED — AB

## 2021-06-26 IMAGING — US US OB < 14 WEEKS - US OB TV
1 series · 15 of 28 positions shown · non-contrast
Comparison: None

CLINICAL DATA: Pregnancy of unknown anatomic location; LMP
04/28/2020

EXAM:
OBSTETRIC <14 WK US AND TRANSVAGINAL OB US
TECHNIQUE: Both transabdominal and transvaginal ultrasound examinations were
performed for complete evaluation of the gestation as well as the
maternal uterus, adnexal regions, and pelvic cul-de-sac.
Transvaginal technique was performed to assess early pregnancy.

[Series 1: us ob < 14 weeks - us ob tv · 15 of 71 slices shown]
[im 1/71]
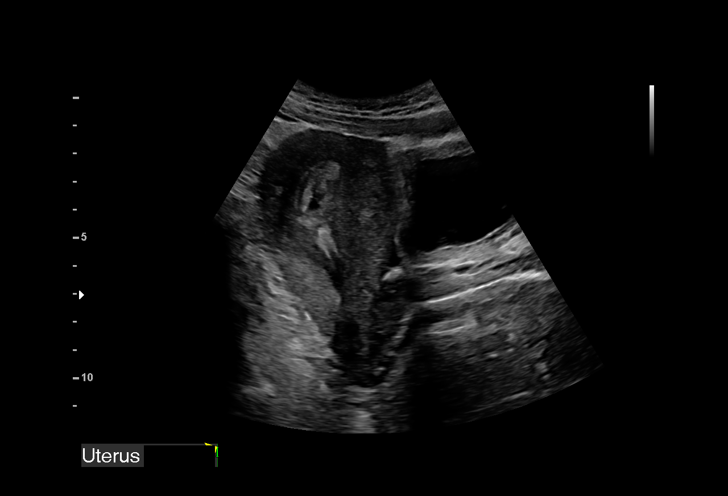
[im 6/71]
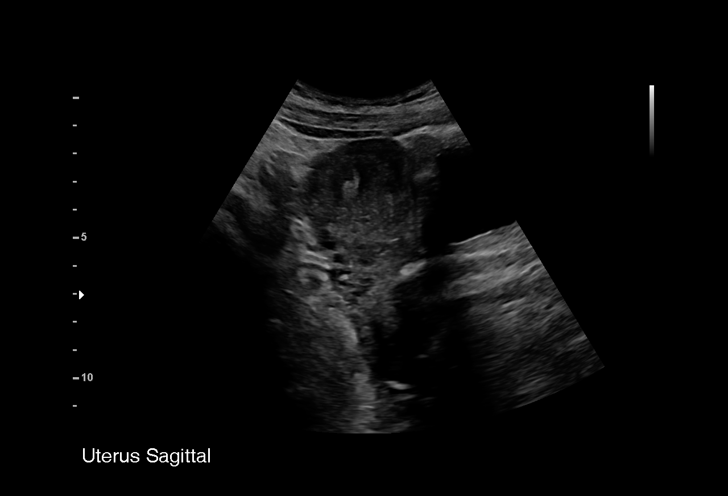
[im 11/71]
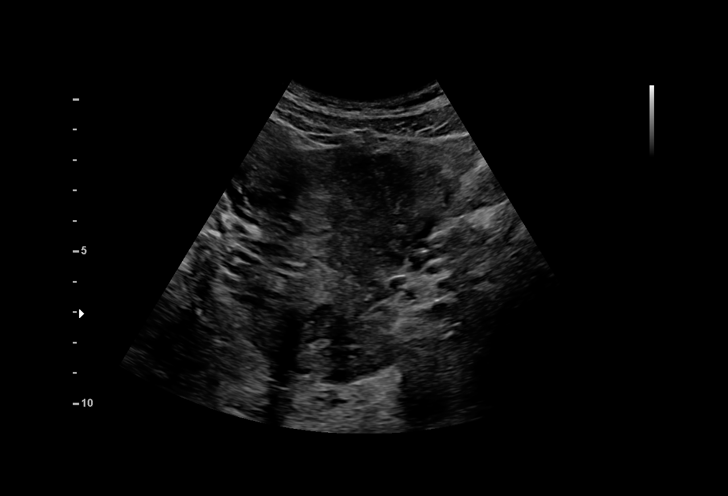
[im 16/71]
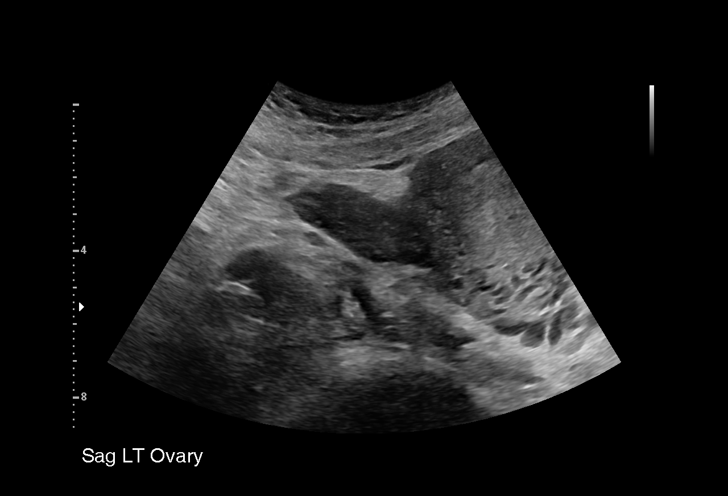
[im 21/71]
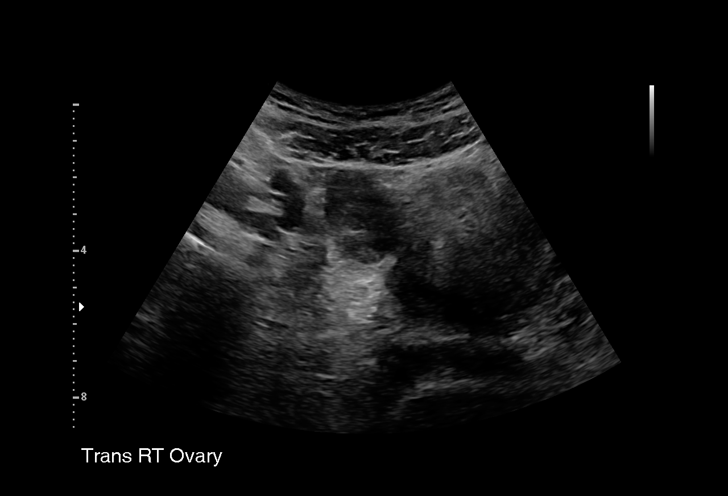
[im 26/71]
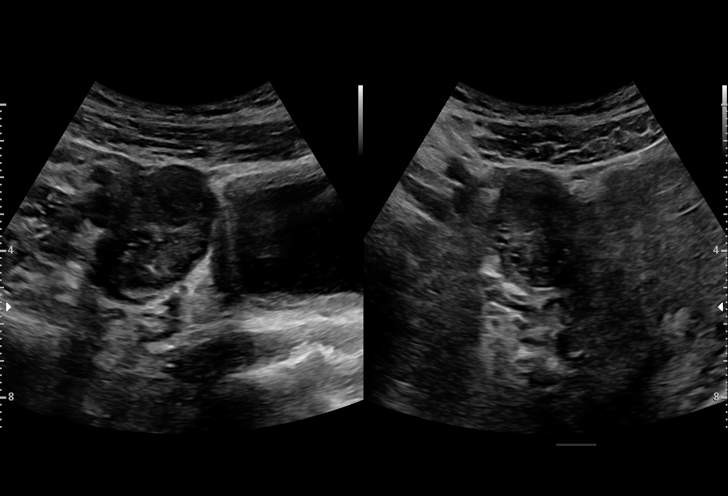
[im 32/71]
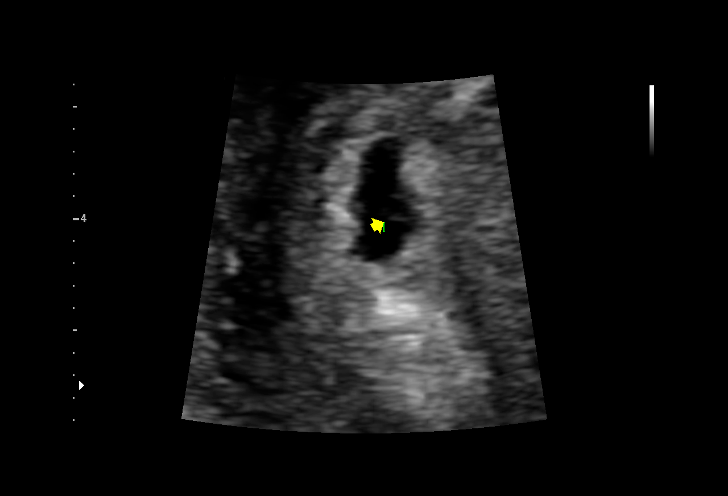
[im 37/71]
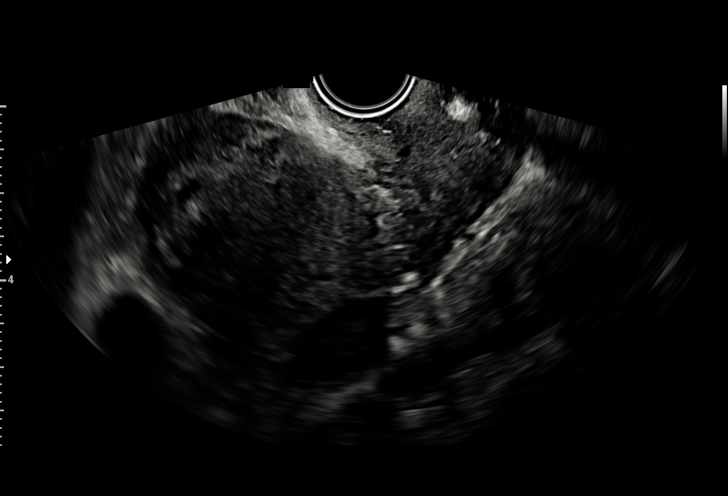
[im 39/71]
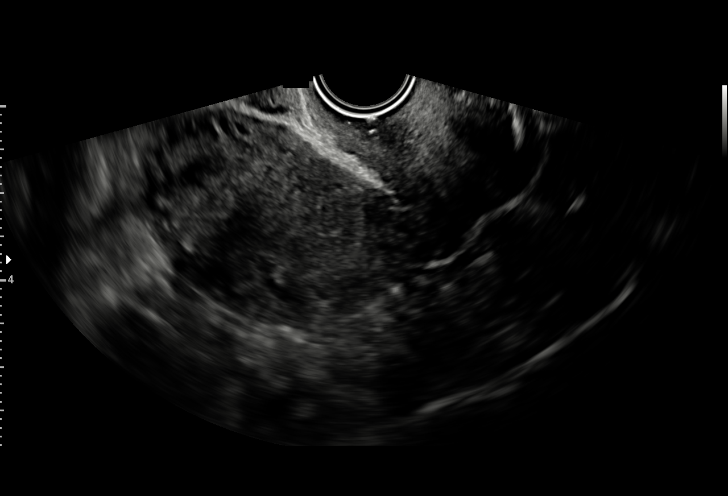
[im 45/71]
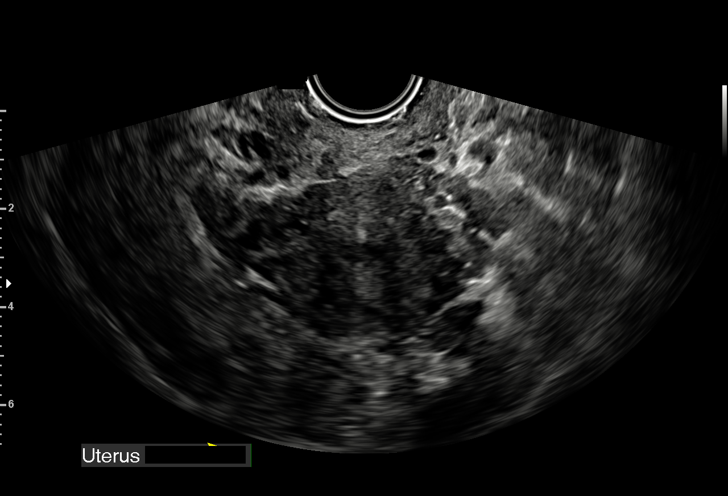
[im 50/71]
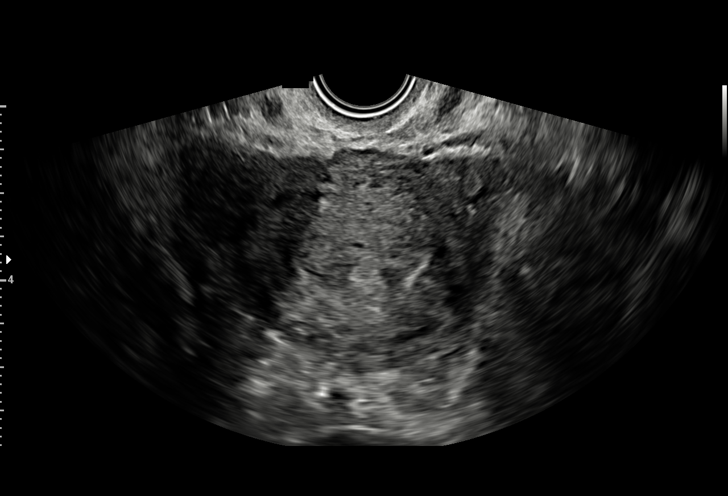
[im 55/71]
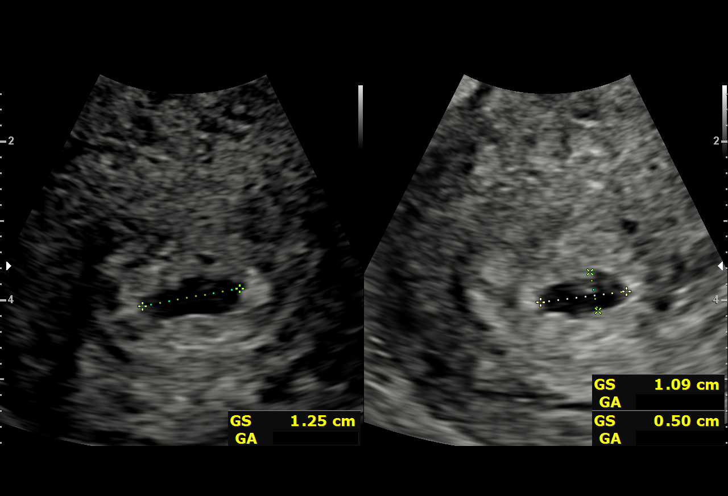
[im 60/71]
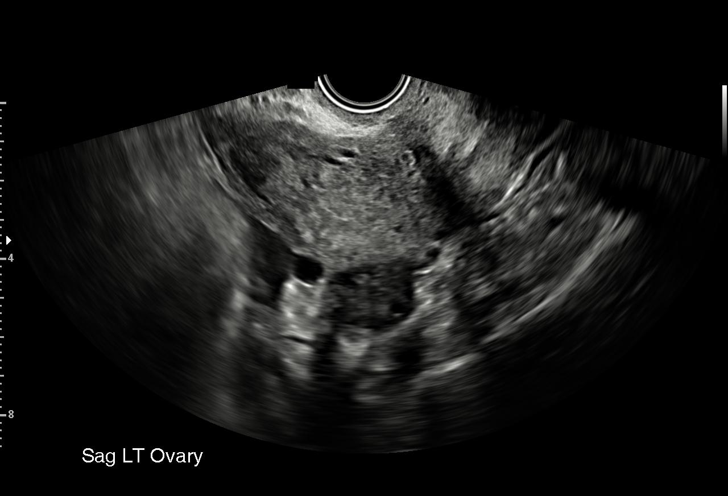
[im 65/71]
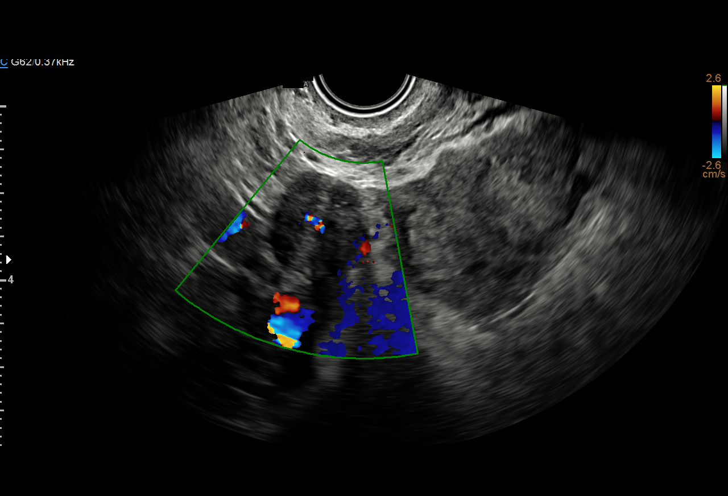
[im 71/71]
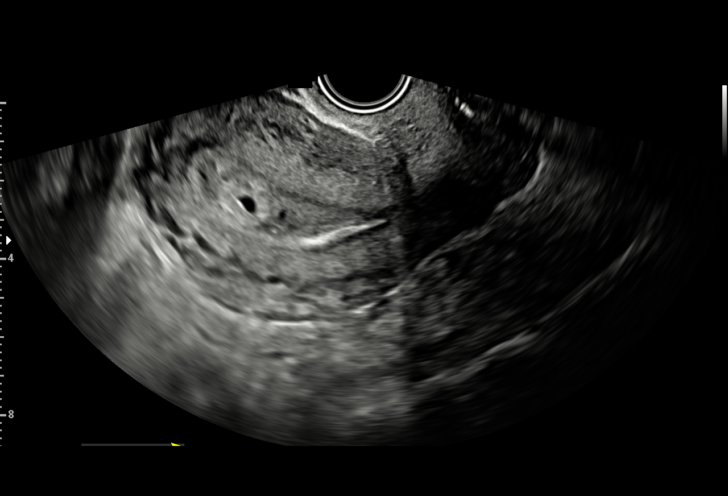

[15 of 28 positions shown; findings below may reference images not displayed]

FINDINGS: Intrauterine gestational sac: Present, single

Yolk sac:  Present

Embryo:  Not definitely identified

Cardiac Activity: N/A

Heart Rate: N/A  bpm

MSD: 9.5 mm   5 w   5 d

Subchorionic hemorrhage:  None visualized.

Maternal uterus/adnexae:

Uterus anteverted, otherwise unremarkable.

RIGHT ovary measures 3.5 x 2.1 x 2.4 cm and contains a small corpus
luteum.

LEFT ovary normal size and morphology 2.2 x 3.9 x 1.4 cm.

No free pelvic fluid or adnexal masses.
IMPRESSION: Intrauterine gestational sac identified containing small yolk sac.

No fetal pole is identified to establish viability.

Consider follow-up ultrasound in 14 days to establish viability if
clinically indicated.

Remainder of exam unremarkable.

## 2021-06-29 ENCOUNTER — Ambulatory Visit (INDEPENDENT_AMBULATORY_CARE_PROVIDER_SITE_OTHER): Payer: Medicaid Other | Admitting: Obstetrics & Gynecology

## 2021-06-29 ENCOUNTER — Encounter: Payer: Self-pay | Admitting: Obstetrics & Gynecology

## 2021-06-29 ENCOUNTER — Other Ambulatory Visit: Payer: Self-pay

## 2021-06-29 VITALS — BP 117/71 | HR 98 | Wt 221.0 lb

## 2021-06-29 DIAGNOSIS — Z3482 Encounter for supervision of other normal pregnancy, second trimester: Secondary | ICD-10-CM

## 2021-06-29 DIAGNOSIS — Z3A27 27 weeks gestation of pregnancy: Secondary | ICD-10-CM

## 2021-06-29 NOTE — Progress Notes (Signed)
   LOW-RISK PREGNANCY VISIT Patient name: Sharon Nash MRN [id number]  Date of birth: 1996-03-14 Chief Complaint:   Routine Prenatal Visit  History of Present Illness:   Sharon Nash is a 25 y.o. [id number] female at [redacted]w[redacted]d with an Estimated Date of Delivery: 09/25/21 being seen today for ongoing management of a low-risk pregnancy.   Asthma- no acute concerns  Depression screen [facility] 2/9 03/14/2021 12/31/2017  Decreased Interest 2 0  Down, Depressed, Hopeless 0 1  PHQ - 2 Score 2 1  Altered sleeping 2 -  Tired, decreased energy 1 -  Change in appetite 2 -  Feeling bad or failure about yourself  0 -  Trouble concentrating 0 -  Moving slowly or fidgety/restless 0 -  Suicidal thoughts 0 -  PHQ-9 Score 7 -    Today she reports occasional contractions. Notes 3-4 per hour at times- very irregular.  She notes early dilation in prior pregnancy.  Requesting cervical check today.   Contractions: Not present. Vag. Bleeding: None.  Movement: Present. denies leaking of fluid. Review of Systems:   Pertinent items are noted in HPI Denies abnormal vaginal discharge w/ itching/odor/irritation, headaches, visual changes, shortness of breath, chest pain, severe nausea/vomiting, or problems with urination or bowel movements unless otherwise stated above. Pertinent History Reviewed:  Reviewed past medical,surgical, social, obstetrical and family history.  Reviewed problem list, medications and allergies.  Physical Assessment:   Vitals:   06/29/21 0843  BP: 117/71  Pulse: 98  Weight: 221 lb (100.2 kg)  Body mass index is 34.61 kg/m.        Physical Examination:   General appearance: Well appearing, and in no distress  Mental status: Alert, oriented to person, place, and time  Skin: Warm & dry  Respiratory: Normal respiratory effort, no distress  Abdomen: Soft, gravid, nontender  Pelvic: Cervical exam performed  Dilation: Closed Effacement (%): Thick Station: Ballotable on speculum exam:  cervix appears closed  Extremities: Edema: None  Psych:  mood and affect appropriate  Fetal Status: Fetal Heart Rate (bpm): 150 Fundal Height: 26 cm Movement: Present    Chaperone: Sharon Nash    No results found for this or any previous visit (from the past 24 hour(s)).   Assessment & Plan:  1) Low-risk pregnancy [id number] at [redacted]w[redacted]d with an Estimated Date of Delivery: 09/25/21   2) Braxton-Hicks- no cervical change noted, reviewed precautions   []  RhoGAM in 1wk   Meds: No orders of the defined types were placed in this encounter.  Labs/procedures today: PN-2  Plan:  Continue routine obstetrical care Next visit: prefers in person    Reviewed: Preterm labor symptoms and general obstetric precautions including but not limited to vaginal bleeding, contractions, leaking of fluid and fetal movement were reviewed in detail with the patient.  All questions were answered.    Follow-up: Return in about 4 weeks (around 07/27/2021) for LROB visit AND 1 wk RN visit- RhoGAM shot.  Orders Placed This Encounter  Procedures   CBC   Glucose Tolerance, 2 Hours w/1 Hour   HIV Antibody (routine testing w rflx)   RPR   Antibody screen    07/29/2021, DO Attending Obstetrician & Gynecologist, [facility], [facility]

## 2021-06-30 LAB — ANTIBODY SCREEN: Antibody Screen: NEGATIVE

## 2021-06-30 LAB — CBC
Hematocrit: 32.4 % — ABNORMAL LOW (ref 34.0–46.6)
Hemoglobin: 10.4 g/dL — ABNORMAL LOW (ref 11.1–15.9)
MCH: 28.9 pg (ref 26.6–33.0)
MCHC: 32.1 g/dL (ref 31.5–35.7)
MCV: 90 fL (ref 79–97)
Platelets: 335 10*3/uL (ref 150–450)
RBC: 3.6 x10E6/uL — ABNORMAL LOW (ref 3.77–5.28)
RDW: 12.8 % (ref 11.7–15.4)
WBC: 7.1 10*3/uL (ref 3.4–10.8)

## 2021-06-30 LAB — RPR: RPR Ser Ql: NONREACTIVE

## 2021-06-30 LAB — GLUCOSE TOLERANCE, 2 HOURS W/ 1HR
Glucose, 1 hour: 142 mg/dL (ref 70–179)
Glucose, 2 hour: 100 mg/dL (ref 70–152)
Glucose, Fasting: 80 mg/dL (ref 70–91)

## 2021-06-30 LAB — HIV ANTIBODY (ROUTINE TESTING W REFLEX): HIV Screen 4th Generation wRfx: NONREACTIVE

## 2021-07-06 ENCOUNTER — Ambulatory Visit (INDEPENDENT_AMBULATORY_CARE_PROVIDER_SITE_OTHER): Payer: Medicaid Other | Admitting: *Deleted

## 2021-07-06 ENCOUNTER — Other Ambulatory Visit: Payer: Self-pay

## 2021-07-06 ENCOUNTER — Encounter: Payer: Self-pay | Admitting: *Deleted

## 2021-07-06 VITALS — BP 121/69 | HR 108 | Ht 68.0 in | Wt 226.0 lb

## 2021-07-06 DIAGNOSIS — Z3480 Encounter for supervision of other normal pregnancy, unspecified trimester: Secondary | ICD-10-CM | POA: Diagnosis not present

## 2021-07-06 DIAGNOSIS — O26899 Other specified pregnancy related conditions, unspecified trimester: Secondary | ICD-10-CM

## 2021-07-06 NOTE — Progress Notes (Signed)
   NURSE VISIT- INJECTION  SUBJECTIVE:  Sharon Nash is a 25 y.o. [phone] female here for a Rhophylac for Rh neg status during pregnancy. She is [redacted]w[redacted]d pregnant.   OBJECTIVE:  BP 121/69   Pulse (!) 108   Ht 5\' 8"  (1.727 m)   Wt 226 lb (102.5 kg)   LMP 11/23/2020 (Approximate)   BMI 34.36 kg/m   Appears well, in no apparent distress  Injection administered in: Left upper quad. gluteus  No orders of the defined types were placed in this encounter.   ASSESSMENT: Pregnancy [redacted]w[redacted]d Rhophylac for Rh neg status during pregnancy PLAN: Follow-up: as scheduled   [redacted]w[redacted]d  07/06/2021 12:26 PM

## 2021-07-27 ENCOUNTER — Ambulatory Visit (INDEPENDENT_AMBULATORY_CARE_PROVIDER_SITE_OTHER): Payer: Medicaid Other | Admitting: Obstetrics & Gynecology

## 2021-07-27 ENCOUNTER — Other Ambulatory Visit: Payer: Self-pay

## 2021-07-27 ENCOUNTER — Encounter: Payer: Self-pay | Admitting: Obstetrics & Gynecology

## 2021-07-27 VITALS — BP 127/86 | HR 97 | Wt 231.0 lb

## 2021-07-27 DIAGNOSIS — Z3480 Encounter for supervision of other normal pregnancy, unspecified trimester: Secondary | ICD-10-CM

## 2021-07-27 NOTE — Progress Notes (Signed)
   LOW-RISK PREGNANCY VISIT Patient name: Sharon Nash MRN [id number]  Date of birth: 01/03/1996 Chief Complaint:   Routine Prenatal Visit  History of Present Illness:   Sharon Nash is a 25 y.o. [id number] female at [redacted]w[redacted]d with an Estimated Date of Delivery: 09/25/21 being seen today for ongoing management of a low-risk pregnancy.  Depression screen [facility] 2/9 03/14/2021 12/31/2017  Decreased Interest 2 0  Down, Depressed, Hopeless 0 1  PHQ - 2 Score 2 1  Altered sleeping 2 -  Tired, decreased energy 1 -  Change in appetite 2 -  Feeling bad or failure about yourself  0 -  Trouble concentrating 0 -  Moving slowly or fidgety/restless 0 -  Suicidal thoughts 0 -  PHQ-9 Score 7 -    Today she reports no complaints. Contractions: Irritability. Vag. Bleeding: None.  Movement: Present. denies leaking of fluid. Review of Systems:   Pertinent items are noted in HPI Denies abnormal vaginal discharge w/ itching/odor/irritation, headaches, visual changes, shortness of breath, chest pain, abdominal pain, severe nausea/vomiting, or problems with urination or bowel movements unless otherwise stated above. Pertinent History Reviewed:  Reviewed past medical,surgical, social, obstetrical and family history.  Reviewed problem list, medications and allergies. Physical Assessment:   Vitals:   07/27/21 1207  BP: 127/86  Pulse: 97  Weight: 231 lb (104.8 kg)  Body mass index is 35.12 kg/m.        Physical Examination:   General appearance: Well appearing, and in no distress  Mental status: Alert, oriented to person, place, and time  Skin: Warm & dry  Cardiovascular: Normal heart rate noted  Respiratory: Normal respiratory effort, no distress  Abdomen: Soft, gravid, nontender  Pelvic: Cervical exam deferred         Extremities: Edema: None  Fetal Status:     Movement: Present    Chaperone: n/a    No results found for this or any previous visit (from the past 24 hour(s)).  Assessment &  Plan:  1) Low-risk pregnancy [id number] at [redacted]w[redacted]d with an Estimated Date of Delivery: 09/25/21      Meds: No orders of the defined types were placed in this encounter.  Labs/procedures today: none  Plan:  Continue routine obstetrical care  Next visit: prefers in person    Reviewed: Preterm labor symptoms and general obstetric precautions including but not limited to vaginal bleeding, contractions, leaking of fluid and fetal movement were reviewed in detail with the patient.  All questions were answered.  home bp cuff. Rx faxed to . Check bp weekly, let us know if >140/90.   Follow-up: No follow-ups on file.  No orders of the defined types were placed in this encounter.   Lazaro Arms, MD 07/27/2021 12:26 PM

## 2021-08-07 ENCOUNTER — Encounter (HOSPITAL_COMMUNITY): Payer: Self-pay | Admitting: *Deleted

## 2021-08-07 ENCOUNTER — Other Ambulatory Visit: Payer: Self-pay

## 2021-08-07 ENCOUNTER — Inpatient Hospital Stay (HOSPITAL_COMMUNITY)
Admission: AD | Admit: 2021-08-07 | Discharge: 2021-08-07 | Disposition: A | Discharge status: Home / Self Care | Payer: Medicaid Other | Attending: Obstetrics and Gynecology | Admitting: Obstetrics and Gynecology

## 2021-08-07 DIAGNOSIS — O4700 False labor before 37 completed weeks of gestation, unspecified trimester: Secondary | ICD-10-CM | POA: Diagnosis not present

## 2021-08-07 DIAGNOSIS — Z3A33 33 weeks gestation of pregnancy: Secondary | ICD-10-CM | POA: Diagnosis not present

## 2021-08-07 DIAGNOSIS — Z3480 Encounter for supervision of other normal pregnancy, unspecified trimester: Secondary | ICD-10-CM

## 2021-08-07 DIAGNOSIS — R102 Pelvic and perineal pain: Secondary | ICD-10-CM | POA: Insufficient documentation

## 2021-08-07 DIAGNOSIS — O4703 False labor before 37 completed weeks of gestation, third trimester: Secondary | ICD-10-CM | POA: Insufficient documentation

## 2021-08-07 DIAGNOSIS — Z3493 Encounter for supervision of normal pregnancy, unspecified, third trimester: Secondary | ICD-10-CM | POA: Diagnosis not present

## 2021-08-07 DIAGNOSIS — Z6791 Unspecified blood type, Rh negative: Secondary | ICD-10-CM | POA: Diagnosis not present

## 2021-08-07 DIAGNOSIS — O26893 Other specified pregnancy related conditions, third trimester: Secondary | ICD-10-CM | POA: Insufficient documentation

## 2021-08-07 DIAGNOSIS — O26899 Other specified pregnancy related conditions, unspecified trimester: Secondary | ICD-10-CM

## 2021-08-07 LAB — WET PREP, GENITAL
Clue Cells Wet Prep HPF POC: NONE SEEN
Sperm: NONE SEEN
Trich, Wet Prep: NONE SEEN
WBC, Wet Prep HPF POC: <10 (ref <10)
Yeast Wet Prep HPF POC: NONE SEEN

## 2021-08-07 LAB — URINALYSIS, ROUTINE W REFLEX MICROSCOPIC
Bilirubin Urine: NEGATIVE
Glucose, UA: NEGATIVE mg/dL
Hgb urine dipstick: NEGATIVE
Ketones, ur: NEGATIVE mg/dL
Nitrite: NEGATIVE
Protein, ur: NEGATIVE mg/dL
Specific Gravity, Urine: 1.01 (ref 1.005–1.030)
pH: 6.5 (ref 5.0–8.0)

## 2021-08-07 LAB — URINALYSIS, MICROSCOPIC (REFLEX)

## 2021-08-07 LAB — OB RESULTS CONSOLE GC/CHLAMYDIA: Gonorrhea: NEGATIVE

## 2021-08-07 MED ORDER — NIFEDIPINE 10 MG PO CAPS
10.0000 mg | ORAL_CAPSULE | ORAL | Status: DC | PRN
  Administered 2021-08-07 (×3): 10 mg via ORAL

## 2021-08-07 MED ORDER — OXYCODONE-ACETAMINOPHEN 5-325 MG PO TABS
1.0000 | ORAL_TABLET | Freq: Once | ORAL | Status: AC
Start: 2021-08-07 — End: 2021-08-07
  Administered 2021-08-07: 1 via ORAL

## 2021-08-07 MED ORDER — SODIUM CHLORIDE 0.9 % IV BOLUS
1000.0000 mL | Freq: Once | INTRAVENOUS | Status: AC
  Administered 2021-08-07: 1000 mL via INTRAVENOUS

## 2021-08-07 MED ORDER — BETAMETHASONE SOD PHOS & ACET 6 (3-3) MG/ML IJ SUSP
12.0000 mg | Freq: Once | INTRAMUSCULAR | Status: AC
  Administered 2021-08-07: 12 mg via INTRAMUSCULAR

## 2021-08-07 NOTE — Progress Notes (Signed)
Report has been called to [facility]

## 2021-08-07 NOTE — Progress Notes (Signed)
G4P2 at 33 weeks reports to [facility] with c/o cxns x2 days.  Would like to be checked.  Receives [facility] at [facility]. Monitors applied per [facility] staff.

## 2021-08-07 NOTE — Progress Notes (Signed)
Dr Jolayne Panther updated on patient signs and symptoms of visit to [facility].  Would like her transferred to [facility] at [facility] for further observation.  Report called to [facility] from [facility] RN.

## 2021-08-07 NOTE — ED Triage Notes (Addendum)
Wants to know if she is has dilated

## 2021-08-07 NOTE — MAU Note (Signed)
PT SAYS SHE WENT TO [facility] TONIGHT AT 8 PM- FOR  LOWER ABD PAIN AND TO SEE IF DILATED- HAD BLOODY SHOW ON THANKSGIVING NIGHT  VE AT [facility]- 1-2 CM [facility] AT [facility] FEELS SAME AS 8PM LAST TIME SMOKED  MARIJUA[place] YESTERDAY

## 2021-08-07 NOTE — MAU Provider Note (Signed)
Chief Complaint:  Abdominal Pain   Event Date/Time   First Provider Initiated Contact with Patient 08/07/21 2004     HPI: ANEVAY Nash is a 25 y.o. [id number] at 66w0dho presents to maternity admissions reporting uterine cramping since Thursday.  Had some post-coital bleeding Thursday but did not seek care for it.  Had cramping all weekend but did not seek care until today when they got stronger.  Has not notified office.  Went to [facility] ED and asked for cervix exam.  Was 1+cm  Given IVF there but IV infiltrated.. She reports good fetal movement, denies LOF, current vaginal bleeding, vaginal itching/burning, urinary symptoms, h/a, dizziness, n/v, diarrhea, constipation or fever/chills.    Abdominal Pain This is a recurrent problem. The current episode started in the past 7 days. The problem occurs intermittently. The problem has been unchanged. The pain is located in the LLQ, RLQ and suprapubic region. The quality of the pain is cramping. The abdominal pain does not radiate. Pertinent negatives include no constipation, diarrhea, dysuria, fever, frequency or nausea. Nothing aggravates the pain. The pain is relieved by Nothing. She has tried nothing for the symptoms.   RN Note: PT SAYS SHE WENT TO [facility] TONIGHT AT 8 PM- FOR  LOWER ABD PAIN AND TO SEE IF DILATED- HAD BLOODY SHOW ON THANKSGIVING NIGHT  VE AT [facility]- 1-2 CM PNC AT [facility] STILL FEELS SAME AS 8PM LAST TIME SMOKED  MARIJUANA YESTERDAY   Past Medical History: Past Medical History:  Diagnosis Date   Asthma    Chronic abdominal pain    Chronic knee pain    Respiratory failure, acute ([facility] 06/2012   bipap only, not intubated, due to asthma exacerbation    Past obstetric history: OB History  Gravida Para Term Preterm AB Living  _0 0 1 2  SAB IAB Ectopic Multiple Live Births  1 0 0 0 2    # Outcome Date GA Lbr Len/2nd Weight Sex Delivery Anes PTL Lv  4 Current           3 SAB 06/14/20 688w5d       2 Term 03/02/16 [id number]  / 00:29 3310 g F Vag-Spont Other N LIV  1 Term 02/19/11 [id number] g M Vag-Spont  N LIV    Past Surgical History: Past Surgical History:  Procedure Laterality Date   NO PAST SURGERIES     None      Family History: Family History  Problem Relation Age of Onset   Asthma Sister    Breast cancer Maternal Aunt    Diabetes Maternal Grandmother    Hypertension Maternal Grandmother    Diabetes Maternal Grandfather    Asthma Maternal Grandfather    Colon cancer Neg Hx     Social History: Social History   Tobacco Use   Smoking status: Never   Smokeless tobacco: Never  Vaping Use   Vaping Use: Never used  Substance Use Topics   Alcohol use: Not Currently    Comment: occ   Drug use: Yes    Types: Marijuana    Comment: LAST SMOKE YESTERDAY    Allergies:  Allergies  Allergen Reactions   Shrimp [Shellfish Allergy] Anaphylaxis    Meds:  Medications Prior to Admission  Medication Sig Dispense Refill Last Dose   acetaminophen (TYLENOL) 500 MG tablet Take 500 mg by mouth every 6 (six) hours as needed.   Past Month   albuterol (PROVENTIL) (5 MG/ML) 0.5% nebulizer  solution Take 0.5 mLs (2.5 mg total) by nebulization every 6 (six) hours as needed for wheezing or shortness of breath. 20 mL 2 08/07/2021   albuterol (VENTOLIN HFA) 108 (90 Base) MCG/ACT inhaler Inhale 2 puffs into the lungs every 4 (four) hours as needed for wheezing or shortness of breath. 18 g 2 08/07/2021   budesonide-formoterol (SYMBICORT) 80-4.5 MCG/ACT inhaler Inhale 2 puffs into the lungs 2 (two) times daily. To prevent wheezing 1 each 2 08/07/2021   cetirizine (ZYRTEC) 10 MG tablet Take 10 mg by mouth.   Past Week   fluticasone (FLONASE) 50 MCG/ACT nasal spray Place into both nostrils daily.   08/07/2021   ibuprofen (ADVIL) 200 MG tablet Take 400 mg by mouth every 6 (six) hours as needed.   08/07/2021   metoCLOPramide (REGLAN) 10 MG tablet Take 1 tablet (10 mg total) by mouth every 8 (eight) hours as needed for  nausea. 30 tablet 0 Past Month   Prenatal Vit-Fe Fumarate-FA (MULTIVITAMIN-PRENATAL) 27-0.8 MG TABS tablet Take 1 tablet by mouth daily at 12 noon.   08/07/2021   Blood Pressure Monitor MISC For regular home bp monitoring during pregnancy 1 each 0    promethazine (PHENERGAN) 25 MG tablet Take 1 tablet (25 mg total) by mouth every 6 (six) hours as needed for nausea or vomiting. (Patient not taking: Reported on 08/07/2021) 30 tablet 1 Not Taking   Respiratory Therapy Supplies (NEBULIZER/TUBING/MOUTHPIECE) KIT Use as directed with nebulizer 1 kit 0     I have reviewed patient's Past Medical Hx, Surgical Hx, Family Hx, Social Hx, medications and allergies.   ROS:  Review of Systems  Constitutional:  Negative for fever.  Gastrointestinal:  Positive for abdominal pain. Negative for constipation, diarrhea and nausea.  Genitourinary:  Negative for dysuria and frequency.  Other systems negative  Physical Exam  Patient Vitals for the past 24 hrs:  BP Temp Temp src Pulse Resp SpO2 Height Weight  08/07/21 1953 -- 97.8 F (36.6 C) Oral 90 (!) 24 98 % $Re'5\' 7"'AMM$  (1.702 m) 106.1 kg  08/07/21 1840 -- -- -- -- 17 -- -- --  08/07/21 1830 117/78 -- -- -- 18 -- -- --  08/07/21 1800 120/73 -- -- -- 19 -- -- --  08/07/21 1743 118/76 98 F (36.7 C) Oral (!) 106 (!) 24 100 % -- --   Constitutional: Well-developed, well-nourished female in no acute distress.  Cardiovascular: normal rate and rhythm Respiratory: normal effort, clear to auscultation bilaterally GI: Abd soft, non-tender, gravid appropriate for gestational age.   No rebound or guarding. MS: Extremities nontender, no edema, normal ROM Neurologic: Alert and oriented x 4.  GU: Neg CVAT.  PELVIC EXAM: No bleeding.  White thin discharge Dilation: 1 Effacement (%): 0 Cervical Position: Posterior Station: Ballotable Presentation: Vertex Exam by:: Jimmye Norman CNM  FHT:  Baseline 140 , moderate variability, accelerations present, no  decelerations Contractions: q 3-5 mins Irregular     Labs: Results for orders placed or performed during the hospital encounter of 08/07/21 (from the past 24 hour(s))  Urinalysis, Routine w reflex microscopic Urine, Clean Catch     Status: Abnormal   Collection Time: 08/07/21  7:55 PM  Result Value Ref Range   Color, Urine YELLOW YELLOW   APPearance CLEAR CLEAR   Specific Gravity, Urine 1.010 1.005 - 1.030   pH 6.5 5.0 - 8.0   Glucose, UA NEGATIVE NEGATIVE mg/dL   Hgb urine dipstick NEGATIVE NEGATIVE   Bilirubin Urine NEGATIVE NEGATIVE   Ketones,  ur NEGATIVE NEGATIVE mg/dL   Protein, ur NEGATIVE NEGATIVE mg/dL   Nitrite NEGATIVE NEGATIVE   Leukocytes,Ua SMALL (A) NEGATIVE  Urinalysis, Microscopic (reflex)     Status: Abnormal   Collection Time: 08/07/21  7:55 PM  Result Value Ref Range   RBC / HPF 0-5 0 - 5 RBC/hpf   WBC, UA 11-20 0 - 5 WBC/hpf   Bacteria, UA RARE (A) NONE SEEN   Squamous Epithelial / LPF 21-50 0 - 5   Mucus PRESENT   Wet prep, genital     Status: None   Collection Time: 08/07/21  8:15 PM   Specimen: Vaginal  Result Value Ref Range   Yeast Wet Prep HPF POC NONE SEEN NONE SEEN   Trich, Wet Prep NONE SEEN NONE SEEN   Clue Cells Wet Prep HPF POC NONE SEEN NONE SEEN   WBC, Wet Prep HPF POC <10 <10   Sperm NONE SEEN     O/Negative/-- (07/06 1154)  Imaging:  No results found.  MAU Course/MDM: I have ordered labs and reviewed results. Wet prep negative. UA mostly normal except leukocytes, sent to culture.  Will not treat for now NST reviewed, reactive.  Treatments in MAU included Procardia series ordered.  Contractions mostly stopped after 3 doses.  Pain then moved to her back mostly, no more pain in front of pelvis.  Will give one Percocet for pain relief before discharge.  Betamethasone given, will repeat in Office at [facility] Thursday morning.  .    Assessment: Single IUP at 34w1dPreterm uterine contractions with small change in cervix Leukocytes in  urine  Plan: Discharge home Preterm Labor precautions and fetal kick counts BMZ #2 to be given in office (message sent) Follow up in Office for prenatal visits and recheck cervix Encouraged to return if she develops worsening of symptoms, increase in pain, fever, or other concerning symptoms.   Pt stable at time of discharge.  MHansel FeinsteinCNM, MSN Certified Nurse-Midwife 08/07/2021 8:04 PM

## 2021-08-07 NOTE — ED Provider Notes (Signed)
[facility] EMERGENCY DEPARTMENT Provider Note   CSN: [id number] Arrival date & time: 08/07/21  1735     History Chief Complaint  Patient presents with   Abdominal Pain   Sharon Nash is a 25 y.o. female.  Presents to ER with concern for abdominal cramping.  G4, P2 at 33 weeks.  Per review of last OB note, low risk uncomplicated pregnancy.  Patient reports that on Thanksgiving day she had bloody discharge but since that time has not had any vaginal bleeding.  No gush of fluids.  2 days ago started having intermittent cramping.  Cramping/pelvic pain is happening approximately every 10 to 20 minutes.  Feeling baby move regularly.  HPI     Past Medical History:  Diagnosis Date   Asthma    Chronic abdominal pain    Chronic knee pain    Respiratory failure, acute ([place]) 06/2012   bipap only, not intubated, due to asthma exacerbation    Patient Active Problem List   Diagnosis Date Noted   Abnormal chromosomal and genetic finding on antenatal screening mother 03/28/2021   Abnormal Pap smear of cervix 03/20/2021   Marijuana use 03/14/2021   Encounter for supervision of normal pregnancy, antepartum 03/07/2021   Rh negative state in antepartum period 03/07/2021   Gastroesophageal reflux disease without esophagitis 01/27/2017   Mild depression 02/20/2016   Asthma exacerbation 07/05/2012    Past Surgical History:  Procedure Laterality Date   NO PAST SURGERIES     None       OB History     Gravida  4   Para  2   Term  2   Preterm  0   AB  1   Living  2      SAB  1   IAB  0   Ectopic  0   Multiple  0   Live Births  2           Family History  Problem Relation Age of Onset   Asthma Sister    Breast cancer Maternal Aunt    Diabetes Maternal Grandmother    Hypertension Maternal Grandmother    Diabetes Maternal Grandfather    Asthma Maternal Grandfather    Colon cancer Neg Hx     Social History   Tobacco Use   Smoking status: Never    Smokeless tobacco: Never  Vaping Use   Vaping Use: Never used  Substance Use Topics   Alcohol use: Not Currently    Comment: occ   Drug use: Yes    Types: Marijuana    Home Medications Prior to Admission medications   Medication Sig Start Date End Date Taking? Authorizing Provider  acetaminophen (TYLENOL) 500 MG tablet Take 1,000 mg by mouth every 6 (six) hours as needed.    [provider]  albuterol (PROVENTIL) (5 MG/ML) 0.5% nebulizer solution Take 0.5 mLs (2.5 mg total) by nebulization every 6 (six) hours as needed for wheezing or shortness of breath. 05/15/21   Vanessa Kick, MD  albuterol (VENTOLIN HFA) 108 (90 Base) MCG/ACT inhaler Inhale 2 puffs into the lungs every 4 (four) hours as needed for wheezing or shortness of breath. 06/12/21   Melynda Ripple, MD  Blood Pressure Monitor MISC For regular home bp monitoring during pregnancy 03/14/21   Roma Schanz, CNM  budesonide-formoterol [facility]) 80-4.5 MCG/ACT inhaler Inhale 2 puffs into the lungs 2 (two) times daily. To prevent wheezing 05/15/21   Vanessa Kick, MD  cetirizine (ZYRTEC) 10 MG  tablet Take 10 mg by mouth.    [provider]  fluticasone (FLONASE) 50 MCG/ACT nasal spray Place into both nostrils daily.    [provider]  metoCLOPramide (REGLAN) 10 MG tablet Take 1 tablet (10 mg total) by mouth every 8 (eight) hours as needed for nausea. 06/12/21   Melynda Ripple, MD  Prenatal Vit-Fe Fumarate-FA (MULTIVITAMIN-PRENATAL) 27-0.8 MG TABS tablet Take 1 tablet by mouth daily at 12 noon.    [provider]  promethazine (PHENERGAN) 25 MG tablet Take 1 tablet (25 mg total) by mouth every 6 (six) hours as needed for nausea or vomiting. 02/26/21   Roma Schanz, CNM  Respiratory Therapy Supplies (NEBULIZER/TUBING/MOUTHPIECE) KIT Use as directed with nebulizer 12/14/20   [place], [place], PA-C    Allergies    Shrimp [shellfish allergy]  Review of Systems   Review of Systems   Constitutional:  Negative for chills and fever.  HENT:  Negative for ear pain and sore throat.   Eyes:  Negative for pain and visual disturbance.  Respiratory:  Negative for cough and shortness of breath.   Cardiovascular:  Negative for chest pain and palpitations.  Gastrointestinal:  Positive for abdominal pain. Negative for vomiting.  Genitourinary:  Positive for pelvic pain. Negative for dysuria and hematuria.  Musculoskeletal:  Negative for arthralgias and back pain.  Skin:  Negative for color change and rash.  Neurological:  Negative for seizures and syncope.  All other systems reviewed and are negative.  Physical Exam Updated Vital Signs BP 118/76   Pulse (!) 106   Temp 98 F (36.7 C) (Oral)   Resp (!) 24   LMP 11/23/2020 (Approximate)   SpO2 100%   Physical Exam Vitals and nursing note reviewed.  Constitutional:      General: She is not in acute distress.    Appearance: She is well-developed.  HENT:     Head: Normocephalic and atraumatic.  Eyes:     Conjunctiva/sclera: Conjunctivae normal.  Cardiovascular:     Rate and Rhythm: Regular rhythm. Tachycardia present.     Heart sounds: No murmur heard. Pulmonary:     Effort: Pulmonary effort is normal. No respiratory distress.     Breath sounds: Normal breath sounds.  Abdominal:     Palpations: Abdomen is soft.     Tenderness: There is no abdominal tenderness.     Comments: gravid  Genitourinary:    Comments: Cervix is 1 cm dilated, high position in vagina Musculoskeletal:        General: No swelling.     Cervical back: Neck supple.  Skin:    General: Skin is warm and dry.     Capillary Refill: Capillary refill takes less than 2 seconds.  Neurological:     Mental Status: She is alert.  Psychiatric:        Mood and Affect: Mood normal.    ED Results / Procedures / Treatments   Labs (all labs ordered are listed, but only abnormal results are displayed) Labs Reviewed - No data to  display  EKG None  Radiology No results found.  Procedures Procedures   Medications Ordered in ED Medications  sodium chloride 0.9 % bolus 1,000 mL (1,000 mLs Intravenous New Bag/Given 08/07/21 1803)    ED Course  I have reviewed the triage vital signs and the nursing notes.  Pertinent labs & imaging results that were available during my care of the patient were reviewed by me and considered in my medical decision making (see chart  for details).    MDM Rules/Calculators/A&P                           25 year old lady G4 P2 at 64 weeks presenting to ER with concern for pelvic/abdominal cramping.  Concern for preterm labor.  Cervix is currently 1 cm dilated.  Head down per ultrasound.  Patient has been connected to external OB monitoring.  I have discussed the case with Dr. Lutricia Horsfall with OBGYN at [facility]. She has accepted the patient as transfer. Pt will need to go to MAU. I have discussed with MAU CNM Melanie. [facility] has been contacted for transport.   Final Clinical Impression(s) / ED Diagnoses Final diagnoses:  [redacted] weeks gestation of pregnancy    Rx / DC Orders ED Discharge Orders     None        Lucrezia Starch, MD 08/07/21 [phone]

## 2021-08-08 LAB — GC/CHLAMYDIA PROBE AMP (~~LOC~~) NOT AT ARMC
Chlamydia: NEGATIVE
Comment: NEGATIVE
Comment: NORMAL
Neisseria Gonorrhea: NEGATIVE

## 2021-08-09 ENCOUNTER — Encounter: Payer: Self-pay | Admitting: *Deleted

## 2021-08-09 ENCOUNTER — Other Ambulatory Visit (INDEPENDENT_AMBULATORY_CARE_PROVIDER_SITE_OTHER): Payer: Medicaid Other | Admitting: *Deleted

## 2021-08-09 VITALS — BP 115/76 | HR 91 | Wt 239.5 lb

## 2021-08-09 DIAGNOSIS — O4703 False labor before 37 completed weeks of gestation, third trimester: Secondary | ICD-10-CM | POA: Diagnosis not present

## 2021-08-09 DIAGNOSIS — Z6791 Unspecified blood type, Rh negative: Secondary | ICD-10-CM

## 2021-08-09 DIAGNOSIS — Z3480 Encounter for supervision of other normal pregnancy, unspecified trimester: Secondary | ICD-10-CM | POA: Diagnosis not present

## 2021-08-09 LAB — CULTURE, OB URINE: Culture: 30000 — AB

## 2021-08-09 MED ORDER — BETAMETHASONE SOD PHOS & ACET 6 (3-3) MG/ML IJ SUSP
12.0000 mg | Freq: Once | INTRAMUSCULAR | Status: AC
Start: 2021-08-09 — End: 2021-08-09
  Administered 2021-08-09: 12 mg via INTRAMUSCULAR

## 2021-08-09 NOTE — Progress Notes (Signed)
   NURSE VISIT- INJECTION  SUBJECTIVE:  Sharon Nash is a 25 y.o. [phone] female here for a Betamethasone for per provider order. She is [redacted]w[redacted]d pregnant.   OBJECTIVE:  BP 115/76   Pulse 91   Wt 239 lb 8 oz (108.6 kg)   LMP 11/23/2020 (Approximate)   BMI 37.51 kg/m   Appears well, in no apparent distress  Injection administered in: Right upper quad. gluteus  Meds ordered this encounter  Medications   betamethasone acetate-betamethasone sodium phosphate (CELESTONE) injection 12 mg    ASSESSMENT: Pregnancy [redacted]w[redacted]d Betamethasone for per provider order PLAN: Follow-up: as scheduled   Malachy Mood  08/09/2021 9:43 AM

## 2021-08-16 ENCOUNTER — Encounter: Payer: Self-pay | Admitting: Women's Health

## 2021-08-16 ENCOUNTER — Telehealth: Payer: Self-pay | Admitting: *Deleted

## 2021-08-16 ENCOUNTER — Other Ambulatory Visit: Payer: Self-pay

## 2021-08-16 ENCOUNTER — Ambulatory Visit (INDEPENDENT_AMBULATORY_CARE_PROVIDER_SITE_OTHER): Payer: Medicaid Other | Admitting: Women's Health

## 2021-08-16 VITALS — BP 122/68 | HR 90 | Wt 248.0 lb

## 2021-08-16 DIAGNOSIS — O4693 Antepartum hemorrhage, unspecified, third trimester: Secondary | ICD-10-CM

## 2021-08-16 DIAGNOSIS — Z3480 Encounter for supervision of other normal pregnancy, unspecified trimester: Secondary | ICD-10-CM

## 2021-08-16 DIAGNOSIS — Z3A34 34 weeks gestation of pregnancy: Secondary | ICD-10-CM

## 2021-08-16 LAB — POCT URINALYSIS DIPSTICK OB
Glucose, UA: NEGATIVE
Ketones, UA: NEGATIVE
Nitrite, UA: NEGATIVE

## 2021-08-16 NOTE — Telephone Encounter (Signed)
Patient states she has started having bright red bleeding again this morning.  She feels some contractions/cramping every 10 minutes and pressure.  Denies recent intercourse.  Advised patient to come in for appointment for evaluation. Pt agreeable to plan.

## 2021-08-16 NOTE — Progress Notes (Signed)
Work-in LOW-RISK PREGNANCY VISIT Patient name: Sharon Nash MRN [id number]  Date of birth: 1996/01/12 Chief Complaint:   Routine Prenatal Visit (Having bright red bleeding and cramping)  History of Present Illness:   Sharon Nash is a 25 y.o. [phone] female at [redacted]w[redacted]d with an Estimated Date of Delivery: 09/25/21 being seen today for ongoing management of a low-risk pregnancy.   Today she is being seen as a work-in for report of cramping and bright red bleeding since midnight. Got up to pee and had brb when wiped, a little on bed. Put on a pad and only had small amt brownish blood on pad when woke up. Rates cramps 4/10, are spaced out.  Went to MAU 11/29 w/ cramping and postcoital bleeding 5d prior, cx 1/th/ballotable, no blood on exam, contracting q 3-87mins, given procardia and percocet, betamethasone x1 there and 1 next day here. Had neg gc/ct and wet prep. Contractions: Not present. Vag. Bleeding: Small.  Movement: Present. denies leaking of fluid.  Depression screen [facility] 2/9 03/14/2021 12/31/2017  Decreased Interest 2 0  Down, Depressed, Hopeless 0 1  PHQ - 2 Score 2 1  Altered sleeping 2 -  Tired, decreased energy 1 -  Change in appetite 2 -  Feeling bad or failure about yourself  0 -  Trouble concentrating 0 -  Moving slowly or fidgety/restless 0 -  Suicidal thoughts 0 -  PHQ-9 Score 7 -     GAD 7 : Generalized Anxiety Score 03/14/2021  Nervous, Anxious, on Edge 2  Control/stop worrying 1  Worry too much - different things 1  Trouble relaxing 1  Restless 1  Easily annoyed or irritable 1  Afraid - awful might happen 0  Total GAD 7 Score 7      Review of Systems:   Pertinent items are noted in HPI Denies abnormal vaginal discharge w/ itching/odor/irritation, headaches, visual changes, shortness of breath, chest pain, abdominal pain, severe nausea/vomiting, or problems with urination or bowel movements unless otherwise stated above. Pertinent History Reviewed:   Reviewed past medical,surgical, social, obstetrical and family history.  Reviewed problem list, medications and allergies. Physical Assessment:   Vitals:   08/16/21 0936  BP: 122/68  Pulse: 90  Weight: 248 lb (112.5 kg)  Body mass index is 38.84 kg/m.        Physical Examination:   General appearance: Well appearing, and in no distress  Mental status: Alert, oriented to person, place, and time  Skin: Warm & dry  Cardiovascular: Normal heart rate noted  Respiratory: Normal respiratory effort, no distress  Abdomen: Soft, gravid, nontender  Pelvic:  spec exam: cx visually closed, small amt milky pinky-reddish nonodorous d/c   Dilation: 1 Effacement (%): Thick Station: Ballotable  Extremities: Edema: Trace Vtx by informal TA u/s  Fetal Status:     Movement: Present Presentation: Vertex NST: FHR baseline 145 bpm, Variability: moderate, Accelerations:present, Decelerations:  Absent= Cat 1/reactive Toco: occ ui   Chaperone: Malachy Mood   Results for orders placed or performed in visit on 08/16/21 (from the past 24 hour(s))  POC Urinalysis Dipstick OB   Collection Time: 08/16/21  9:39 AM  Result Value Ref Range   Color, UA     Clarity, UA     Glucose, UA Negative Negative   Bilirubin, UA     Ketones, UA neg    Spec Grav, UA     Blood, UA 3+    pH, UA     POC,PROTEIN,UA Small (1+)  Negative, Trace, Small (1+), Moderate (2+), Large (3+), 4+   Urobilinogen, UA     Nitrite, UA neg    Leukocytes, UA Trace (A) Negative   Appearance     Odor      Assessment & Plan:  1) Low-risk pregnancy [id number] at [redacted]w[redacted]d with an Estimated Date of Delivery: 09/25/21   2) VB & cramping, O-, received Rhogam 10/28. Neg gc/ct, wet prep 11/29. S/P BMZ x 2. No cervical change. Reviewed all w/ LHE, feels ok w/ her going home w/ strict precautions. Reviewed w/ pt, discussed inpt x 1wk for continued monitoring vs going home w/ strict precautions, possibility of partial marginal abruption and if progresses  can potentially lead to fetal death/maternal hemorrhaging. Pt does not want to go to hospital, wants to go home. Reviewed reasons to seek care, including if bleeding returns to red/bright red, increased cramping/contractions, lof, decreased fm. Pt verbalized understanding.    Meds: No orders of the defined types were placed in this encounter.  Labs/procedures today: spec exam and NST  Plan:  Continue routine obstetrical care  Next visit: prefers in person    Reviewed: Preterm labor symptoms and general obstetric precautions including but not limited to vaginal bleeding, contractions, leaking of fluid and fetal movement were reviewed in detail with the patient.  All questions were answered. Does have home bp cuff. Office bp cuff given: not applicable. Check bp weekly, let us know if consistently >140 and/or >90.  Follow-up: Return for As scheduled.  Future Appointments  Date Time Provider Department Center  08/20/2021  9:50 AM Myna Hidalgo, DO [facility]    Orders Placed This Encounter  Procedures   POC Urinalysis Dipstick OB   Cheral Marker CNM, [facility] 08/16/2021 10:47 AM

## 2021-08-16 NOTE — Patient Instructions (Addendum)
Sharon Nash, thank you for choosing our office today! We appreciate the opportunity to meet your healthcare needs. You may receive a short survey by mail, e-mail, or through [employer]. If you are happy with your care we would appreciate if you could take just a few minutes to complete the survey questions. We read all of your comments and take your feedback very seriously. Thank you again for choosing our office.  Center for [employer] Team at [facility]  [facility] at [facility] ([address] [place], [place] [address]) Entrance C, located off of [address] Free 24/7 valet parking   GO STRAIGHT TO [facility] IF BLEEDING RETURNS, CRAMPS GET WORSE, LEAKING FLUID, BABY NOT MOVING LIKE NORMAL  CLASSES: Go to [email or web address] to register for classes (childbirth, breastfeeding, waterbirth, infant CPR, daddy bootcamp, etc.)  Call the office [phone]) or go to [facility] if: You begin to have strong, frequent contractions Your water breaks.  Sometimes it is a big gush of fluid, sometimes it is just a trickle that keeps getting your panties wet or running down your legs You have vaginal bleeding.  It is normal to have a small amount of spotting if your cervix was checked.  You don't feel your baby moving like normal.  If you don't, get you something to eat and drink and lay down and focus on feeling your baby move.   If your baby is still not moving like normal, you should call the office or go to [facility].  Call the office [phone]) or go to [facility] hospital for these signs of pre-eclampsia: Severe headache that does not go away with Tylenol Visual changes- seeing spots, double, blurred vision Pain under your right breast or upper abdomen that does not go away with Tums or heartburn medicine Nausea and/or vomiting Severe swelling in your hands, feet, and face   Tdap Vaccine It is recommended that you get the Tdap vaccine during the third trimester of  EACH pregnancy to help protect your baby from getting pertussis (whooping cough) 27-36 weeks is the BEST time to do this so that you can pass the protection on to your baby. During pregnancy is better than after pregnancy, but if you are unable to get it during pregnancy it will be offered at the hospital.  You can get this vaccine with [place], at the health department, your family doctor, or some local pharmacies Everyone who will be around your baby should also be up-to-date on their vaccines before the baby comes. Adults (who are not pregnant) only need 1 dose of Tdap during adulthood.   [facility] Pediatricians/Family Doctors [facility]): [address] Colette Ribas, [phone]           [facility]: [address], [phone]                [facility]): [address], [phone] (call to ask if accepting patients) [facility] Department: [address], [place], [phone]    [facility] Pediatricians/Family Doctors [facility]): [phone], [phone] [facility]: [address] [place], [phone] [facility]: [address], [phone]  [facility] Doctors  [facility]): [phone] [facility]: [address] [phone]   [facility] Doctors [facility]: [address] [phone]  [facility] Doctors  [facility]: [phone]  150, [phone]  Home Blood Pressure Monitoring for Patients   Your provider has recommended that you check your blood pressure (BP) at least once a week at home. If you do not have a blood pressure cuff at home, one will be provided for you. Contact your provider if you have not received your monitor within 1 week.   Helpful Tips for Accurate Home Blood Pressure Checks  Don't smoke, exercise, or drink  caffeine 30 minutes before checking your BP Use the restroom before checking your BP (a full bladder can raise your pressure) Relax in a comfortable upright chair Feet on the ground Left arm resting comfortably on a flat surface at the level of your heart Legs uncrossed Back supported Sit quietly and don't talk Place the cuff on your bare arm Adjust snuggly, so that only two fingertips can fit between your skin and the top of the cuff Check 2 readings separated by at least one minute Keep a log of your BP readings For a visual, please reference this diagram: http://ccnc.care/bpdiagram  Provider Name: [facility]     Phone: [phone]  Zone 1: ALL CLEAR  Continue to monitor your symptoms:  BP reading is less than 140 (top number) or less than 90 (bottom number)  No right upper stomach pain No headaches or seeing spots No feeling nauseated or throwing up No swelling in face and hands  Zone 2: CAUTION Call your doctor's office for any of the following:  BP reading is greater than 140 (top number) or greater than 90 (bottom number)  Stomach pain under your ribs in the middle or right side Headaches or seeing spots Feeling nauseated or throwing up Swelling in face and hands  Zone 3: EMERGENCY  Seek immediate medical care if you have any of the following:  BP reading is greater than160 (top number) or greater than 110 (bottom number) Severe headaches not improving with Tylenol Serious difficulty catching your breath Any worsening symptoms from Zone 2  Preterm Labor and Birth Information  The normal length of a pregnancy is 39-41 weeks. Preterm labor is when labor starts before 37 completed weeks of pregnancy. What are the risk factors for preterm labor? Preterm labor is more likely to occur in women who: Have certain infections during pregnancy such as a bladder infection, sexually transmitted infection, or infection inside the uterus (chorioamnionitis). Have a  shorter-than-normal cervix. Have gone into preterm labor before. Have had surgery on their cervix. Are younger than age 92 or older than age 83. Are African American. Are pregnant with twins or multiple babies (multiple gestation). Take street drugs or smoke while pregnant. Do not gain enough weight while pregnant. Became pregnant shortly after having been pregnant. What are the symptoms of preterm labor? Symptoms of preterm labor include: Cramps similar to those that can happen during a menstrual period. The cramps may happen with diarrhea. Pain in the abdomen or lower back. Regular uterine contractions that may feel like tightening of the abdomen. A feeling of increased pressure in the pelvis. Increased watery or bloody mucus discharge from the vagina. Water breaking (ruptured amniotic sac). Why is it important to recognize signs of preterm labor? It is important to recognize signs of preterm labor because babies who are born prematurely may not be fully developed. This can put them at an increased risk for: Long-term (chronic) heart and lung problems. Difficulty immediately after birth with regulating body systems, including blood sugar, body temperature, heart rate, and breathing rate. Bleeding in  the brain. Cerebral palsy. Learning difficulties. Death. These risks are highest for babies who are born before 34 weeks of pregnancy. How is preterm labor treated? Treatment depends on the length of your pregnancy, your condition, and the health of your baby. It may involve: Having a stitch (suture) placed in your cervix to prevent your cervix from opening too early (cerclage). Taking or being given medicines, such as: Hormone medicines. These may be given early in pregnancy to help support the pregnancy. Medicine to stop contractions. Medicines to help mature the baby's lungs. These may be prescribed if the risk of delivery is high. Medicines to prevent your baby from developing  cerebral palsy. If the labor happens before 34 weeks of pregnancy, you may need to stay in the hospital. What should I do if I think I am in preterm labor? If you think that you are going into preterm labor, call your health care provider right away. How can I prevent preterm labor in future pregnancies? To increase your chance of having a full-term pregnancy: Do not use any tobacco products, such as cigarettes, chewing tobacco, and e-cigarettes. If you need help quitting, ask your health care provider. Do not use street drugs or medicines that have not been prescribed to you during your pregnancy. Talk with your health care provider before taking any herbal supplements, even if you have been taking them regularly. Make sure you gain a healthy amount of weight during your pregnancy. Watch for infection. If you think that you might have an infection, get it checked right away. Make sure to tell your health care provider if you have gone into preterm labor before. This information is not intended to replace advice given to you by your health care provider. Make sure you discuss any questions you have with your health care provider. Document Revised: 12/18/2018 Document Reviewed: 01/17/2016 Elsevier Patient Education  2020 [employer].

## 2021-08-20 ENCOUNTER — Ambulatory Visit (INDEPENDENT_AMBULATORY_CARE_PROVIDER_SITE_OTHER): Payer: Medicaid Other | Admitting: Obstetrics & Gynecology

## 2021-08-20 ENCOUNTER — Other Ambulatory Visit: Payer: Self-pay

## 2021-08-20 ENCOUNTER — Encounter: Payer: Self-pay | Admitting: Obstetrics & Gynecology

## 2021-08-20 VITALS — BP 123/84 | HR 77 | Wt 253.0 lb

## 2021-08-20 DIAGNOSIS — J4541 Moderate persistent asthma with (acute) exacerbation: Secondary | ICD-10-CM

## 2021-08-20 DIAGNOSIS — Z3480 Encounter for supervision of other normal pregnancy, unspecified trimester: Secondary | ICD-10-CM

## 2021-08-20 MED ORDER — BUDESONIDE-FORMOTEROL FUMARATE 160-4.5 MCG/ACT IN AERO: 2.0000 | INHALATION_SPRAY | Freq: Two times a day (BID) | RESPIRATORY_TRACT | 12 refills | Status: DC

## 2021-08-20 NOTE — Progress Notes (Signed)
   LOW-RISK PREGNANCY VISIT Patient name: Sharon Nash MRN [id number]  Date of birth: 09/21/1995 Chief Complaint:   Routine Prenatal Visit  History of Present Illness:   Sharon Nash is a 25 y.o. [id number] female at [redacted]w[redacted]d with an Estimated Date of Delivery: 09/25/21 being seen today for ongoing management of a low-risk pregnancy.   -Asthma- she states this has been getting worse.  Using rescue inhaler sometimes up to 3x per day.  Needs refill on symbicort  Depression screen [facility] 2/9 03/14/2021 12/31/2017  Decreased Interest 2 0  Down, Depressed, Hopeless 0 1  PHQ - 2 Score 2 1  Altered sleeping 2 -  Tired, decreased energy 1 -  Change in appetite 2 -  Feeling bad or failure about yourself  0 -  Trouble concentrating 0 -  Moving slowly or fidgety/restless 0 -  Suicidal thoughts 0 -  PHQ-9 Score 7 -    Today she reports  issues with asthma- see above . Contractions: Not present. Vag. Bleeding: None.  Movement: Present. denies leaking of fluid. Review of Systems:   Pertinent items are noted in HPI Denies abnormal vaginal discharge w/ itching/odor/irritation, headaches, visual changes, chest pain, abdominal pain, severe nausea/vomiting, or problems with urination or bowel movements unless otherwise stated above. Pertinent History Reviewed:  Reviewed past medical,surgical, social, obstetrical and family history.  Reviewed problem list, medications and allergies.  Physical Assessment:   Vitals:   08/20/21 1001  BP: 123/84  Pulse: 77  Weight: 253 lb (114.8 kg)  Body mass index is 39.63 kg/m.        Physical Examination:   General appearance: Well appearing, and in no distress  Mental status: Alert, oriented to person, place, and time  Skin: Warm & dry  Respiratory: Normal respiratory effort, no distress  Abdomen: Soft, gravid, nontender  Pelvic: Cervical exam deferred         Extremities: Edema: Trace  Psych:  mood and affect appropriate  Fetal Status: Fetal Heart Rate  (bpm): 150 Fundal Height: 35 cm Movement: Present    Chaperone: n/a    No results found for this or any previous visit (from the past 24 hour(s)).   Assessment & Plan:  1) Low-risk pregnancy [id number] at [redacted]w[redacted]d with an Estimated Date of Delivery: 09/25/21   2) Asthma-moderate persistent -increased to higher dose Symbicort- continue twice daily -continue with rescue inhaler when needed -reviewed precautions- should she note worsening of symptoms ie unable to catch breath- go to urgent care   Meds:  Meds ordered this encounter  Medications   budesonide-formoterol (SYMBICORT) 160-4.5 MCG/ACT inhaler    Sig: Inhale 2 puffs into the lungs in the morning and at bedtime.    Dispense:  1 each    Refill:  12   Labs/procedures today: doppler  Plan:  Continue routine obstetrical care  Next visit: prefers in person    Reviewed: Preterm labor symptoms and general obstetric precautions including but not limited to vaginal bleeding, contractions, leaking of fluid and fetal movement were reviewed in detail with the patient.  All questions were answered.  Follow-up: Return in about 2 weeks (around 09/03/2021) for LROB visit.  No orders of the defined types were placed in this encounter.   Myna Hidalgo, DO Attending Obstetrician & Gynecologist, [facility] for [employer], [facility]

## 2021-08-22 ENCOUNTER — Encounter (HOSPITAL_COMMUNITY): Payer: Self-pay | Admitting: Obstetrics and Gynecology

## 2021-08-22 ENCOUNTER — Other Ambulatory Visit: Payer: Self-pay

## 2021-08-22 ENCOUNTER — Inpatient Hospital Stay (HOSPITAL_COMMUNITY)
Admission: AD | Admit: 2021-08-22 | Discharge: 2021-08-24 | DRG: 805 | Disposition: A | Discharge status: Home / Self Care | Payer: Medicaid Other | Attending: Obstetrics and Gynecology | Admitting: Obstetrics and Gynecology

## 2021-08-22 DIAGNOSIS — O26899 Other specified pregnancy related conditions, unspecified trimester: Secondary | ICD-10-CM

## 2021-08-22 DIAGNOSIS — O9081 Anemia of the puerperium: Secondary | ICD-10-CM | POA: Diagnosis not present

## 2021-08-22 DIAGNOSIS — O9952 Diseases of the respiratory system complicating childbirth: Secondary | ICD-10-CM | POA: Diagnosis present

## 2021-08-22 DIAGNOSIS — Z6791 Unspecified blood type, Rh negative: Secondary | ICD-10-CM | POA: Diagnosis not present

## 2021-08-22 DIAGNOSIS — O4590 Premature separation of placenta, unspecified, unspecified trimester: Secondary | ICD-10-CM

## 2021-08-22 DIAGNOSIS — Z3490 Encounter for supervision of normal pregnancy, unspecified, unspecified trimester: Secondary | ICD-10-CM

## 2021-08-22 DIAGNOSIS — D563 Thalassemia minor: Secondary | ICD-10-CM | POA: Diagnosis not present

## 2021-08-22 DIAGNOSIS — O4693 Antepartum hemorrhage, unspecified, third trimester: Secondary | ICD-10-CM | POA: Diagnosis not present

## 2021-08-22 DIAGNOSIS — J45909 Unspecified asthma, uncomplicated: Secondary | ICD-10-CM | POA: Diagnosis present

## 2021-08-22 DIAGNOSIS — Z20822 Contact with and (suspected) exposure to covid-19: Secondary | ICD-10-CM | POA: Diagnosis not present

## 2021-08-22 DIAGNOSIS — O4593 Premature separation of placenta, unspecified, third trimester: Principal | ICD-10-CM | POA: Diagnosis present

## 2021-08-22 DIAGNOSIS — R030 Elevated blood-pressure reading, without diagnosis of hypertension: Secondary | ICD-10-CM | POA: Diagnosis present

## 2021-08-22 DIAGNOSIS — O26893 Other specified pregnancy related conditions, third trimester: Secondary | ICD-10-CM | POA: Diagnosis not present

## 2021-08-22 DIAGNOSIS — O285 Abnormal chromosomal and genetic finding on antenatal screening of mother: Secondary | ICD-10-CM | POA: Diagnosis present

## 2021-08-22 DIAGNOSIS — O43813 Placental infarction, third trimester: Secondary | ICD-10-CM | POA: Diagnosis not present

## 2021-08-22 DIAGNOSIS — Z3A35 35 weeks gestation of pregnancy: Secondary | ICD-10-CM | POA: Diagnosis not present

## 2021-08-22 DIAGNOSIS — D62 Acute posthemorrhagic anemia: Secondary | ICD-10-CM | POA: Diagnosis not present

## 2021-08-22 DIAGNOSIS — Z3480 Encounter for supervision of other normal pregnancy, unspecified trimester: Secondary | ICD-10-CM

## 2021-08-22 LAB — COMPREHENSIVE METABOLIC PANEL
ALT: 14 U/L (ref 0–44)
AST: 18 U/L (ref 15–41)
Albumin: 2.2 g/dL — ABNORMAL LOW (ref 3.5–5.0)
Alkaline Phosphatase: 123 U/L (ref 38–126)
Anion gap: 8 (ref 5–15)
BUN: 14 mg/dL (ref 6–20)
CO2: 19 mmol/L — ABNORMAL LOW (ref 22–32)
Calcium: 8.5 mg/dL — ABNORMAL LOW (ref 8.9–10.3)
Chloride: 105 mmol/L (ref 98–111)
Creatinine, Ser: 0.72 mg/dL (ref 0.44–1.00)
GFR, Estimated: >60 mL/min (ref >60)
Glucose, Bld: 90 mg/dL (ref 70–99)
Potassium: 4.1 mmol/L (ref 3.5–5.1)
Sodium: 132 mmol/L — ABNORMAL LOW (ref 135–145)
Total Bilirubin: 0.2 mg/dL — ABNORMAL LOW (ref 0.3–1.2)
Total Protein: 6.4 g/dL — ABNORMAL LOW (ref 6.5–8.1)

## 2021-08-22 LAB — RESP PANEL BY RT-PCR (FLU A&B, COVID) ARPGX2
Influenza A by PCR: NEGATIVE
Influenza B by PCR: NEGATIVE
SARS Coronavirus 2 by RT PCR: NEGATIVE

## 2021-08-22 LAB — CBC
HCT: 25 % — ABNORMAL LOW (ref 36.0–46.0)
Hemoglobin: 7.8 g/dL — ABNORMAL LOW (ref 12.0–15.0)
MCH: 28.8 pg (ref 26.0–34.0)
MCHC: 31.2 g/dL (ref 30.0–36.0)
MCV: 92.3 fL (ref 80.0–100.0)
Platelets: 250 10*3/uL (ref 150–400)
RBC: 2.71 MIL/uL — ABNORMAL LOW (ref 3.87–5.11)
RDW: 13.8 % (ref 11.5–15.5)
WBC: 8.3 10*3/uL (ref 4.0–10.5)
nRBC: 0 % (ref 0.0–0.2)

## 2021-08-22 LAB — PREPARE RBC (CROSSMATCH)

## 2021-08-22 LAB — RPR: RPR Ser Ql: NONREACTIVE

## 2021-08-22 MED ORDER — OXYTOCIN-SODIUM CHLORIDE 30-0.9 UT/500ML-% IV SOLN
1.0000 m[IU]/min | INTRAVENOUS | Status: DC
  Administered 2021-08-22: 10:00:00 2 m[IU]/min via INTRAVENOUS
  Filled 2021-08-22: qty 500

## 2021-08-22 MED ORDER — OXYCODONE-ACETAMINOPHEN 5-325 MG PO TABS
2.0000 | ORAL_TABLET | ORAL | Status: DC | PRN
  Filled 2021-08-22: qty 2

## 2021-08-22 MED ORDER — PRENATAL MULTIVITAMIN CH
1.0000 | ORAL_TABLET | Freq: Every day | ORAL | Status: DC
  Administered 2021-08-23 – 2021-08-24 (×2): 1 via ORAL
  Filled 2021-08-22 (×2): qty 1

## 2021-08-22 MED ORDER — SODIUM CHLORIDE 0.9 % IV SOLN
5.0000 10*6.[IU] | Freq: Once | INTRAVENOUS | Status: AC
  Administered 2021-08-22: 03:00:00 5 10*6.[IU] via INTRAVENOUS
  Filled 2021-08-22: qty 5

## 2021-08-22 MED ORDER — MEASLES, MUMPS & RUBELLA VAC IJ SOLR: 0.5000 mL | Freq: Once | INTRAMUSCULAR | Status: DC

## 2021-08-22 MED ORDER — SODIUM CHLORIDE 0.9% IV SOLUTION: Freq: Once | INTRAVENOUS | Status: DC

## 2021-08-22 MED ORDER — ONDANSETRON HCL 4 MG PO TABS: 4.0000 mg | ORAL_TABLET | ORAL | Status: DC | PRN

## 2021-08-22 MED ORDER — LACTATED RINGERS IV SOLN: 500.0000 mL | INTRAVENOUS | Status: DC | PRN

## 2021-08-22 MED ORDER — LIDOCAINE HCL (PF) 1 % IJ SOLN: 30.0000 mL | INTRAMUSCULAR | Status: DC | PRN

## 2021-08-22 MED ORDER — DIPHENHYDRAMINE HCL 25 MG PO CAPS: 25.0000 mg | ORAL_CAPSULE | Freq: Four times a day (QID) | ORAL | Status: DC | PRN

## 2021-08-22 MED ORDER — SOD CITRATE-CITRIC ACID 500-334 MG/5ML PO SOLN: 30.0000 mL | ORAL | Status: DC | PRN

## 2021-08-22 MED ORDER — ACETAMINOPHEN 325 MG PO TABS
650.0000 mg | ORAL_TABLET | ORAL | Status: DC | PRN
  Administered 2021-08-22 – 2021-08-24 (×7): 650 mg via ORAL
  Filled 2021-08-22 (×7): qty 2

## 2021-08-22 MED ORDER — SENNOSIDES-DOCUSATE SODIUM 8.6-50 MG PO TABS
2.0000 | ORAL_TABLET | ORAL | Status: DC
  Administered 2021-08-23 – 2021-08-24 (×2): 2 via ORAL
  Filled 2021-08-22 (×2): qty 2

## 2021-08-22 MED ORDER — LACTATED RINGERS IV SOLN: Freq: Once | INTRAVENOUS | Status: AC

## 2021-08-22 MED ORDER — OXYCODONE-ACETAMINOPHEN 5-325 MG PO TABS
1.0000 | ORAL_TABLET | ORAL | Status: DC | PRN
  Administered 2021-08-22: 17:00:00 1 via ORAL

## 2021-08-22 MED ORDER — SIMETHICONE 80 MG PO CHEW: 80.0000 mg | CHEWABLE_TABLET | ORAL | Status: DC | PRN

## 2021-08-22 MED ORDER — LACTATED RINGERS IV SOLN: INTRAVENOUS | Status: DC

## 2021-08-22 MED ORDER — PNEUMOCOCCAL VAC POLYVALENT 25 MCG/0.5ML IJ INJ: 0.5000 mL | INJECTION | INTRAMUSCULAR | Status: DC

## 2021-08-22 MED ORDER — OXYTOCIN-SODIUM CHLORIDE 30-0.9 UT/500ML-% IV SOLN: 2.5000 [IU]/h | INTRAVENOUS | Status: DC

## 2021-08-22 MED ORDER — IBUPROFEN 600 MG PO TABS
600.0000 mg | ORAL_TABLET | Freq: Four times a day (QID) | ORAL | Status: DC
  Administered 2021-08-22 – 2021-08-24 (×8): 600 mg via ORAL
  Filled 2021-08-22 (×8): qty 1

## 2021-08-22 MED ORDER — WITCH HAZEL-GLYCERIN EX PADS: 1.0000 "application " | MEDICATED_PAD | CUTANEOUS | Status: DC | PRN

## 2021-08-22 MED ORDER — ONDANSETRON HCL 4 MG/2ML IJ SOLN: 4.0000 mg | INTRAMUSCULAR | Status: DC | PRN

## 2021-08-22 MED ORDER — MOMETASONE FURO-FORMOTEROL FUM 200-5 MCG/ACT IN AERO
2.0000 | INHALATION_SPRAY | Freq: Two times a day (BID) | RESPIRATORY_TRACT | Status: DC
  Administered 2021-08-22 – 2021-08-23 (×4): 2 via RESPIRATORY_TRACT
  Filled 2021-08-22: qty 8.8

## 2021-08-22 MED ORDER — ACETAMINOPHEN 325 MG PO TABS
650.0000 mg | ORAL_TABLET | Freq: Once | ORAL | Status: AC
  Administered 2021-08-22: 09:00:00 650 mg via ORAL
  Filled 2021-08-22: qty 2

## 2021-08-22 MED ORDER — OXYTOCIN BOLUS FROM INFUSION
333.0000 mL | Freq: Once | INTRAVENOUS | Status: AC
  Administered 2021-08-22: 16:00:00 333 mL via INTRAVENOUS

## 2021-08-22 MED ORDER — DIBUCAINE (PERIANAL) 1 % EX OINT: 1.0000 "application " | TOPICAL_OINTMENT | CUTANEOUS | Status: DC | PRN

## 2021-08-22 MED ORDER — DIPHENHYDRAMINE HCL 25 MG PO CAPS
25.0000 mg | ORAL_CAPSULE | Freq: Once | ORAL | Status: AC
  Administered 2021-08-22: 09:00:00 25 mg via ORAL
  Filled 2021-08-22: qty 1

## 2021-08-22 MED ORDER — ONDANSETRON HCL 4 MG/2ML IJ SOLN: 4.0000 mg | Freq: Four times a day (QID) | INTRAMUSCULAR | Status: DC | PRN

## 2021-08-22 MED ORDER — FENTANYL CITRATE (PF) 100 MCG/2ML IJ SOLN
100.0000 ug | INTRAMUSCULAR | Status: DC | PRN
  Administered 2021-08-22 (×8): 100 ug via INTRAVENOUS
  Filled 2021-08-22 (×8): qty 2

## 2021-08-22 MED ORDER — SODIUM CHLORIDE 0.9% IV SOLUTION
Freq: Once | INTRAVENOUS | Status: AC
  Administered 2021-08-22: 08:00:00 1000 mL via INTRAVENOUS

## 2021-08-22 MED ORDER — TERBUTALINE SULFATE 1 MG/ML IJ SOLN
INTRAMUSCULAR | Status: AC
  Filled 2021-08-22: qty 1

## 2021-08-22 MED ORDER — TETANUS-DIPHTH-ACELL PERTUSSIS 5-2.5-18.5 LF-MCG/0.5 IM SUSY: 0.5000 mL | PREFILLED_SYRINGE | Freq: Once | INTRAMUSCULAR | Status: DC

## 2021-08-22 MED ORDER — ALBUTEROL SULFATE HFA 108 (90 BASE) MCG/ACT IN AERS
2.0000 | INHALATION_SPRAY | RESPIRATORY_TRACT | Status: DC | PRN
  Filled 2021-08-22: qty 6.7

## 2021-08-22 MED ORDER — BENZOCAINE-MENTHOL 20-0.5 % EX AERO
1.0000 "application " | INHALATION_SPRAY | CUTANEOUS | Status: DC | PRN
  Administered 2021-08-22: 1 via TOPICAL
  Filled 2021-08-22: qty 56

## 2021-08-22 MED ORDER — PENICILLIN G POT IN DEXTROSE 60000 UNIT/ML IV SOLN
3.0000 10*6.[IU] | INTRAVENOUS | Status: DC
  Administered 2021-08-22 (×2): 3 10*6.[IU] via INTRAVENOUS
  Filled 2021-08-22 (×6): qty 50

## 2021-08-22 MED ORDER — ACETAMINOPHEN 325 MG PO TABS: 650.0000 mg | ORAL_TABLET | ORAL | Status: DC | PRN

## 2021-08-22 MED ORDER — TERBUTALINE SULFATE 1 MG/ML IJ SOLN: 0.2500 mg | Freq: Once | INTRAMUSCULAR | Status: DC | PRN

## 2021-08-22 MED ORDER — COCONUT OIL OIL: 1.0000 "application " | TOPICAL_OIL | Status: DC | PRN

## 2021-08-22 NOTE — Progress Notes (Addendum)
Pt is a 25 yo g4p2 @ 35+1, here with suspected abruption and early labor. Bleeding currently is mild but hgb has dropped from 10.4 2 months to 7.8 on presentation. mother is hemodynamically stable. Bp is mildly elevated but cmp is normal as are platelets, at this time low suspicion for hellp and/or DIC. Mother is feeling well and fht is persistent category 1 with accelerations. 2 units of PRBCs are ordered; it is unclear to what extent this is anemia of pregnancy and to what extent this is acute blood loss anemia. We are beginning augmentation with pitocin and will plan to AROM as soon as safe to do so (very ballotable last cervical exam). Explained to mother that non-reassuring fetal heart tracing remote from delivery would be indication for cesarean as would heavy bleeding or other threat to the mother. I explained that her anemia is cause for some concern and thus we will monitor closely and have a relatively low threshold to proceed to operative delivery. Mother agrees with this plan.

## 2021-08-22 NOTE — MAU Note (Signed)
VB STARTED  AT 11PM PAIN IN BACK AND PELVIC FHR- 137 VB- PAD - RED BLOOD - MOD

## 2021-08-22 NOTE — Progress Notes (Signed)
Sharon Nash is a 25 y.o. [phone] at [redacted]w[redacted]d admitted for bright red vaginal bleeding (concerning for abruption) and preterm labor  Subjective: Feeling more pressure and pain with contractions. Wanting to walk around. Bleeding is the same.   Objective: BP 123/80    Pulse 64    Temp 97.8 F (36.6 C) (Oral)    Resp 16    Ht 5\' 8"  (1.727 m)    Wt 113.5 kg    LMP 11/23/2020 (Approximate)    SpO2 98%    BMI 38.06 kg/m  No intake/output data recorded. Total I/O In: 630.4 [Blood:630.4] Out: -   FHT:  FHR: 120 bpm, variability: moderate,  accelerations:  Present,  decelerations:  Absent UC:   regular, every 2-4 minutes SVE:   Dilation: 5 Effacement (%): 60 Station: -1 Exam by:: Dr. [email or web address], MD  Labs: Lab Results  Component Value Date   WBC 8.3 08/22/2021   HGB 7.8 (L) 08/22/2021   HCT 25.0 (L) 08/22/2021   MCV 92.3 08/22/2021   PLT 250 08/22/2021   Assessment / Plan:  [redacted]w[redacted]d admitted for bright red vaginal bleeding (concerning for abruption) and preterm labor  #Preterm labor in setting of vaginal bleeding concerning for abruption -Head well applied on recheck has made progress from 4-5 cm. AROM with clear/bloody fluid.  -Titrate pitocin to complete -s/p 2 doses of betamethasone 07/2021  #Elevated BP   One elevated BP early AM in 140s. Labs normal. All other BP normotensive  Fetal Wellbeing:  Category I Pain Control:  IV pain meds I/D:   GBS unk, preterm therefore on  PCN  #Anemia in setting of abruption Bleeding currently still continues but in smaller amounts than at admission. Will continue to monitor and quantify with weighing towels/chucks. 2U pRBCs ordered received. Will check CBC pp.   08/2021, M.D. PGY-2 Family Medicine Resident

## 2021-08-22 NOTE — Progress Notes (Addendum)
Patient ID: AYMARA SASSI, female   DOB: 28-Nov-1995, 25 y.o.   MRN: [id number] ADELFA LOZITO is a 25 y.o. [phone] at [redacted]w[redacted]d admitted for  bright red vaginal bleeding and pain  Subjective: Contractions mild, has gotten 1 dose of fentanyl earlier and it helped, requests more. Bleeding still about the same. Denies dizziness/lightheadedness.   Objective: BP 130/85 (BP Location: Right Arm)    Pulse 69    Temp 98.6 F (37 C) (Oral)    Resp 17    Ht 5\' 8"  (1.727 m)    Wt 113.5 kg    LMP 11/23/2020 (Approximate)    BMI 38.06 kg/m  No intake/output data recorded.  FHR baseline 125 bpm, Variability: moderate, Accelerations:present, Decelerations:  Absent Toco: q 5-6  SVE:   Dilation: 3 Effacement (%): 50 Station: -3, Ballotable Exam by:: [email or web address] CNM   Labs: Lab Results  Component Value Date   WBC 8.3 08/22/2021   HGB 7.8 (L) 08/22/2021   HCT 25.0 (L) 08/22/2021   MCV 92.3 08/22/2021   PLT 250 08/22/2021   Assessment / Plan: Patient here for SOL with BRB and suspected partial abruption.  Labor: early. Starting Pit as contractions have spaced out. Needs a second IV, calling IV team to place. Hgb came back at 7.9, giving 2U pRBC. Will check pp. Did have 1 elevated pressure, checking a CMP for PreE labs. Hgb likely low in the setting of abruption. Can consider AROM when head is well applied.  Fetal Wellbeing:  Category I Pain Control:  IV pain meds Pre-eclampsia: N/A I/D:   PCN for preterm/GBS unknown Anticipated MOD: NSVB  08/24/2021 M.D., PGY-2 08/22/2021, 9:05 AM

## 2021-08-22 NOTE — H&P (Addendum)
Sharon Nash is a 25 y.o. [phone] female at 58w1dby 6wk u/s, presenting w/ bright red bleeding that started at 2300, and back & pelvic pain. Felt like period started, went to br and had large amount bright red blood in pants, put on a 'big diaper pad' and filled it up while on her way to hospital. Pain in back/pelvis started after bleeding.   Reports active fetal movement, contractions: regular, vaginal bleeding: flow about like a period/heavier than period; membranes: intact.  Initiated prenatal care at [facility] at 12 wks.   Most recent u/s 05/31/21 @ 23wks: EFW 24%, normal anatomy.   This pregnancy complicated by: Asthma Rh neg-last Rhogam 07/06/21 +silent carrier alpha-thalassemia Bleeding @ 33wks, given BMZ x2, resolved until now  Prenatal History/Complications:  Uncomplicated SVB x2, SAB x1  Past Medical History: Past Medical History:  Diagnosis Date   Asthma    Chronic abdominal pain    Chronic knee pain    Respiratory failure, acute ([place] 06/2012   bipap only, not intubated, due to asthma exacerbation    Past Surgical History: Past Surgical History:  Procedure Laterality Date   NO PAST SURGERIES     None      Obstetrical History: OB History     Gravida  4   Para  2   Term  2   Preterm  0   AB  1   Living  2      SAB  1   IAB  0   Ectopic  0   Multiple  0   Live Births  2           Social History: Social History   Socioeconomic History   Marital status: Single    Spouse name: Not on file   Number of children: Not on file   Years of education: Not on file   Highest education level: Not on file  Occupational History   Not on file  Tobacco Use   Smoking status: Never   Smokeless tobacco: Never  Vaping Use   Vaping Use: Never used  Substance and Sexual Activity   Alcohol use: Not Currently    Comment: occ   Drug use: Not Currently    Types: Marijuana    Comment: LAST SMOKE YESTERDAY   Sexual activity: Not Currently    Birth  control/protection: None  Other Topics Concern   Not on file  Social History Narrative   Not on file   Social Determinants of Health   Financial Resource Strain: Low Risk    Difficulty of Paying Living Expenses: Not very hard  Food Insecurity: No Food Insecurity   Worried About RCharity fundraiserin the Last Year: Never true   Ran Out of Food in the Last Year: Never true  Transportation Needs: No Transportation Needs   Lack of Transportation (Medical): No   Lack of Transportation (Non-Medical): No  Physical Activity: Insufficiently Active   Days of Exercise per Week: 2 days   Minutes of Exercise per Session: 10 min  Stress: No Stress Concern Present   Feeling of Stress : Not at all  Social Connections: Moderately Isolated   Frequency of Communication with Friends and Family: More than three times a week   Frequency of Social Gatherings with Friends and Family: Twice a week   Attends Religious Services: 1 to 4 times per year   Active Member of [employer] Organizations: No   Attends CArchivistMeetings: Never  Marital Status: Never married    Family History: Family History  Problem Relation Age of Onset   Asthma Sister    Breast cancer Maternal Aunt    Diabetes Maternal Grandmother    Hypertension Maternal Grandmother    Diabetes Maternal Grandfather    Asthma Maternal Grandfather    Colon cancer Neg Hx     Allergies: Allergies  Allergen Reactions   Shrimp [Shellfish Allergy] Anaphylaxis    Medications Prior to Admission  Medication Sig Dispense Refill Last Dose   albuterol (VENTOLIN HFA) 108 (90 Base) MCG/ACT inhaler Inhale 2 puffs into the lungs every 4 (four) hours as needed for wheezing or shortness of breath. 18 g 2 08/21/2021   budesonide-formoterol (SYMBICORT) 160-4.5 MCG/ACT inhaler Inhale 2 puffs into the lungs in the morning and at bedtime. 1 each 12 08/21/2021   Prenatal Vit-Fe Fumarate-FA (MULTIVITAMIN-PRENATAL) 27-0.8 MG TABS tablet Take 1  tablet by mouth daily at 12 noon.   08/21/2021   acetaminophen (TYLENOL) 500 MG tablet Take 500 mg by mouth every 6 (six) hours as needed.      albuterol (PROVENTIL) (5 MG/ML) 0.5% nebulizer solution Take 0.5 mLs (2.5 mg total) by nebulization every 6 (six) hours as needed for wheezing or shortness of breath. 20 mL 2    Blood Pressure Monitor MISC For regular home bp monitoring during pregnancy (Patient not taking: Reported on 08/09/2021) 1 each 0    cetirizine (ZYRTEC) 10 MG tablet Take 10 mg by mouth.      fluticasone (FLONASE) 50 MCG/ACT nasal spray Place into both nostrils daily.      Respiratory Therapy Supplies (NEBULIZER/TUBING/MOUTHPIECE) KIT Use as directed with nebulizer 1 kit 0     Review of Systems  Pertinent pos/neg as indicated in HPI  Blood pressure 136/83, pulse 87, temperature 98.9 F (37.2 C), temperature source Oral, resp. rate 18, height 5' 8"  (1.727 m), weight 113.5 kg, last menstrual period 11/23/2020. General appearance: alert, cooperative, and no distress Lungs: clear to auscultation bilaterally Heart: regular rate and rhythm Abdomen: gravid, soft, non-tender Extremities: tr edema  Fetal monitoring: FHR: 155 bpm, variability: moderate,  Accelerations: Present,  decelerations:  Present  mild variables Uterine activity: q 1-79mns Dilation: 4 Effacement (%): 50 Station: -3 Exam by:: MHansel Feinstein CNM Presentation: cephalic   Prenatal labs: ABO, Rh: O/Negative/-- (07/06 1154) Antibody: Negative (10/21 0912) Rubella: 8.35 (07/06 1154) RPR: Non Reactive (10/21 0912)  HBsAg: Negative (07/06 1154)  HIV: Non Reactive (10/21 0912)  GBS:   unknown/preterm 2hr GTT: 80/142/100  No results found for this or any previous visit (from the past 24 hour(s)).   Assessment:  324w1dIUP  [id number] labor  Suspected partial marginal abruption  Cat 2 FHR  GBS  unknown  Plan:  Admit to L&D, cleared w/ NICU (Dr. EhKatherina Mires IV pain meds/epidural prn active  labor  Expectant management  PCN for GBS unknown/preterm  S/P BMZ 11/29 & 12/1  Anticipate NSVB   Plans to bottlefeed  Contraception: Nexplanon inpt  Circumcision: yes  KiRoma SchanzNM, WHNP-BC 08/22/2021, 2:30 AM

## 2021-08-22 NOTE — Discharge Summary (Signed)
Postpartum Discharge Summary  Date of Service updated     Patient Name: Sharon Nash DOB: [birth year] MRN: [id number]  Date of admission: 08/22/2021 Delivery date:08/22/2021  Delivering provider: Layla Barter  Date of discharge: 08/24/2021  Admitting diagnosis: Vaginal bleeding in pregnancy, third trimester [O46.93] Intrauterine pregnancy: [redacted]w[redacted]d    Secondary diagnosis:  Active Problems:   Encounter for supervision of normal pregnancy, antepartum   Rh negative state in antepartum period   Abnormal chromosomal and genetic finding on antenatal screening mother   Placental abruption   Preterm delivery   Vaginal delivery  Additional problems: Acute blood loss anemia    Discharge diagnosis: Preterm Pregnancy Delivered                                              Post partum procedures: None Augmentation: AROM and Pitocin Complications: Placental Abruption  Hospital course: Onset of Labor With Vaginal Delivery      25y.o. yo [id number] [id number] admitted in early labor and with bright red vaginal bleeding on 08/22/2021. She was s/p beta methasone (2 doses) at admission due to bleeding at previous time previously in this pregnancy. She was expectantly managed initially but with continued bleeding concerning for abruption and not making cervical change her labor was augmented with pitocin and AROM. She ultimately progressed to complete and delivered.  Membrane Rupture Time/Date: 2:11 PM ,08/22/2021   Delivery Method:Vaginal, Spontaneous  Episiotomy: None  Lacerations:  None  Patient had an uncomplicated postpartum course.  She is ambulating, tolerating a regular diet, passing flatus, and urinating well. Patient is discharged home in stable condition on 08/24/21.  Newborn Data: Birth date:08/22/2021  Birth time:3:36 PM  Gender:Female  Living status:Living  Apgars:8 ,9  Weight:2220 g   Magnesium Sulfate received: No BMZ received:  Yes Rhophylac:Yes MMR:N/A T-DaP:Given prenatally Flu: No Transfusion: IV venofer   Physical exam  Vitals:   08/23/21 0820 08/23/21 1421 08/23/21 2142 08/24/21 0530  BP: 128/84 126/79 113/67 114/64  Pulse: 84 81 85 74  Resp: 16 16 16 16   Temp: 98.4 F (36.9 C) 98.7 F (37.1 C) 98.7 F (37.1 C) 98.1 F (36.7 C)  TempSrc: Oral Oral Oral Oral  SpO2:  99%    Weight:      Height:       General: alert, cooperative, and no distress Lochia: appropriate Uterine Fundus: firm Incision: N/A DVT Evaluation: pedal edema bilaterally and equally  Labs: Lab Results  Component Value Date   WBC 8.9 08/23/2021   HGB 8.8 (L) 08/23/2021   HCT 27.5 (L) 08/23/2021   MCV 87.9 08/23/2021   PLT 226 08/23/2021   CMP Latest Ref Rng & Units 08/22/2021  Glucose 70 - 99 mg/dL 90  BUN 6 - 20 mg/dL 14  Creatinine 0.44 - 1.00 mg/dL 0.72  Sodium 135 - 145 mmol/L 132(L)  Potassium 3.5 - 5.1 mmol/L 4.1  Chloride 98 - 111 mmol/L 105  CO2 22 - 32 mmol/L 19(L)  Calcium 8.9 - 10.3 mg/dL 8.5(L)  Total Protein 6.5 - 8.1 g/dL 6.4(L)  Total Bilirubin 0.3 - 1.2 mg/dL 0.2(L)  Alkaline Phos 38 - 126 U/L 123  AST 15 - 41 U/L 18  ALT 0 - 44 U/L 14   Edinburgh Score: Edinburgh Postnatal Depression Scale Screening Tool 08/22/2021  I have been able to laugh  and see the funny side of things. 0  I have looked forward with enjoyment to things. 0  I have blamed myself unnecessarily when things went wrong. 1  I have been anxious or worried for no good reason. 0  I have felt scared or panicky for no good reason. 0  Things have been getting on top of me. 0  I have been so unhappy that I have had difficulty sleeping. 0  I have felt sad or miserable. 0  I have been so unhappy that I have been crying. 0  The thought of harming myself has occurred to me. 0  Edinburgh Postnatal Depression Scale Total 1     After visit meds:  Allergies as of 08/24/2021       Reactions   Shrimp [shellfish Allergy] Anaphylaxis         Medication List     STOP taking these medications    Blood Pressure Monitor Misc       TAKE these medications    acetaminophen 500 MG tablet Commonly known as: TYLENOL Take 500 mg by mouth every 6 (six) hours as needed.   albuterol (5 MG/ML) 0.5% nebulizer solution Commonly known as: PROVENTIL Take 0.5 mLs (2.5 mg total) by nebulization every 6 (six) hours as needed for wheezing or shortness of breath.   albuterol 108 (90 Base) MCG/ACT inhaler Commonly known as: VENTOLIN HFA Inhale 2 puffs into the lungs every 4 (four) hours as needed for wheezing or shortness of breath.   budesonide-formoterol 160-4.5 MCG/ACT inhaler Commonly known as: Symbicort Inhale 2 puffs into the lungs in the morning and at bedtime.   cetirizine 10 MG tablet Commonly known as: ZYRTEC Take 10 mg by mouth.   fluticasone 50 MCG/ACT nasal spray Commonly known as: FLONASE Place into both nostrils daily.   ibuprofen 600 MG tablet Commonly known as: ADVIL Take 1 tablet (600 mg total) by mouth every 6 (six) hours.   multivitamin-prenatal 27-0.8 MG Tabs tablet Take 1 tablet by mouth daily at 12 noon.   Nebulizer/Tubing/Mouthpiece Kit Use as directed with nebulizer         Discharge home in stable condition Infant Feeding: Breast Infant Disposition: Hopefully home with mother Discharge instruction: per After Visit Summary and Postpartum booklet. Activity: Advance as tolerated. Pelvic rest for 6 weeks.  Diet: routine diet Future Appointments: Future Appointments  Date Time Provider [place]  09/26/2021  1:30 PM Roma Schanz, CNM [facility]   Follow up Visit: Message sent to [facility] by Dr. Cy Blamer on 12/14  Please schedule this patient for a In person postpartum visit in 4 weeks with the following provider: Any provider. Additional Postpartum F/U: None   High risk pregnancy complicated by:  placental abruption Delivery mode:  Vaginal, Spontaneous  Anticipated Birth  Control:   Plans Nexplanon , would like this at her postpartum visit    08/24/2021 Patriciaann Clan, DO

## 2021-08-22 NOTE — Progress Notes (Signed)
Sharon Nash is a 25 y.o. [phone] at [redacted]w[redacted]d admitted for bright red vaginal bleeding (concerning for abruption) and preterm labor  Subjective: Feeling ok. Bleeding has been less heavy than when she came in. Towel at introitus with ~3cm x3cm spot of pinkish red blood. Towed was placed around 0730  Objective: BP 129/87    Pulse 62    Temp 98.8 F (37.1 C) (Oral)    Resp 15    Ht 5\' 8"  (1.727 m)    Wt 113.5 kg    LMP 11/23/2020 (Approximate)    SpO2 99%    BMI 38.06 kg/m  No intake/output data recorded. No intake/output data recorded.  FHT:  FHR: 120 bpm, variability: moderate,  accelerations:  Present,  decelerations:  Absent UC:   regular, every 2-4 minutes SVE:   Dilation: 3.5 Effacement (%): 60 Station: -2 Exam by:: Dr. [email or web address], MD  Labs: Lab Results  Component Value Date   WBC 8.3 08/22/2021   HGB 7.8 (L) 08/22/2021   HCT 25.0 (L) 08/22/2021   MCV 92.3 08/22/2021   PLT 250 08/22/2021    Assessment / Plan:  [redacted]w[redacted]d admitted for bright red vaginal bleeding (concerning for abruption) and preterm labor  #Preterm labor in setting of vaginal bleeding concerning for abruption - cervix remains ~3-4 cm. Given vaginal bleeding continues (albeit at slow rate) will start pitocin at this time. Station continues to be ballotable. BSUS used to confirm vertex positioning. Will uptitrate pitocin as tolerated and AROM when appropriate. -s/p 2 doses of betamethasone in 07/2021  #Elevated BP   One elevated BP early AM in 140s. Labs normal. All other BP normotensive  Fetal Wellbeing:  Category I Pain Control:  IV pain meds I/D:   GBS unk, preterm therefore on  PCN   #Anemia in setting of abruption Bleeding currently still continues but in smaller amounts than at admission. Will continue to monitor and quantify with weighing towels/chucks. 2U pRBCs ordered . 1st unit being transfused now. And will follow with second unit  08/2021, MD, MPH OB Fellow, Faculty Practice

## 2021-08-22 NOTE — MAU Provider Note (Signed)
Chief Complaint:  Vaginal Bleeding   Event Date/Time   First Provider Initiated Contact with Patient 08/22/21 0122     HPI: Sharon Nash is a 25 y.o. [id number] at 13w1dho presents to maternity admissions reporting vaginal bleeding for 2 hours.  ALso having painful contractions.  Had some bleeding a week ago but it was minimal and exam was normal . She reports good fetal movement, denies urinary symptoms, h/a, dizziness, n/v, diarrhea, constipation or fever/chills.    Vaginal Bleeding The patient's primary symptoms include pelvic pain and vaginal bleeding. The patient's pertinent negatives include no genital itching, genital lesions or genital odor. This is a new problem. The current episode started today. The problem occurs constantly. The problem has been gradually worsening. She is pregnant. Associated symptoms include abdominal pain. Pertinent negatives include no chills, fever or headaches. The vaginal discharge was bloody and watery. The vaginal bleeding is heavier than menses. She has not been passing clots. She has not been passing tissue. Nothing aggravates the symptoms. She has tried nothing for the symptoms.    Past Medical History: Past Medical History:  Diagnosis Date   Asthma    Chronic abdominal pain    Chronic knee pain    [place]   bipap only, not intubated, due to asthma exacerbation    Past obstetric history: OB History  Gravida Para Term Preterm AB Living  4 2 2  0 1 2  SAB IAB Ectopic Multiple Live Births  1 0 0 0 2    # Outcome Date GA Lbr Len/2nd Weight Sex Delivery Anes PTL Lv  4 Current           3 SAB 06/14/20 646w5d       2 Term 03/02/16 3825w2d:33 / 00:29 3310 g F Vag-Spont Other N LIV  1 Term 02/19/11 40w84w0d45 g M Vag-Spont  N LIV    Past Surgical History: Past Surgical History:  Procedure Laterality Date   NO PAST SURGERIES     None      Family History: Family History  Problem Relation Age of Onset    Asthma Sister    Breast cancer Maternal Aunt    Diabetes Maternal Grandmother    Hypertension Maternal Grandmother    Diabetes Maternal Grandfather    Asthma Maternal Grandfather    Colon cancer Neg Hx     Social History: Social History   Tobacco Use   Smoking status: Never   Smokeless tobacco: Never  Vaping Use   Vaping Use: Never used  Substance Use Topics   Alcohol use: Not Currently    Comment: occ   Drug use: Not Currently    Types: Marijuana    Comment: LAST SMOKE YESTERDAY    Allergies:  Allergies  Allergen Reactions   Shrimp [Shellfish Allergy] Anaphylaxis    Meds:  Medications Prior to Admission  Medication Sig Dispense Refill Last Dose   albuterol (VENTOLIN HFA) 108 (90 Base) MCG/ACT inhaler Inhale 2 puffs into the lungs every 4 (four) hours as needed for wheezing or shortness of breath. 18 g 2 08/21/2021   budesonide-formoterol (SYMBICORT) 160-4.5 MCG/ACT inhaler Inhale 2 puffs into the lungs in the morning and at bedtime. 1 each 12 08/21/2021   Prenatal Vit-Fe Fumarate-FA (MULTIVITAMIN-PRENATAL) 27-0.8 MG TABS tablet Take 1 tablet by mouth daily at 12 noon.   08/21/2021   acetaminophen (TYLENOL) 500 MG tablet Take 500 mg by mouth every 6 (six) hours as needed.  albuterol (PROVENTIL) (5 MG/ML) 0.5% nebulizer solution Take 0.5 mLs (2.5 mg total) by nebulization every 6 (six) hours as needed for wheezing or shortness of breath. 20 mL 2    Blood Pressure Monitor MISC For regular home bp monitoring during pregnancy (Patient not taking: Reported on 08/09/2021) 1 each 0    cetirizine (ZYRTEC) 10 MG tablet Take 10 mg by mouth.      fluticasone (FLONASE) 50 MCG/ACT nasal spray Place into both nostrils daily.      Respiratory Therapy Supplies (NEBULIZER/TUBING/MOUTHPIECE) KIT Use as directed with nebulizer 1 kit 0     I have reviewed patient's Past Medical Hx, Surgical Hx, Family Hx, Social Hx, medications and allergies.   ROS:  Review of Systems  Constitutional:   Negative for chills and fever.  Gastrointestinal:  Positive for abdominal pain.  Genitourinary:  Positive for pelvic pain and vaginal bleeding.  Neurological:  Negative for headaches.  Other systems negative  Physical Exam  Patient Vitals for the past 24 hrs:  BP Temp Temp src Pulse Resp Height Weight  08/22/21 0115 136/83 -- -- 87 -- -- --  08/22/21 0100 126/70 98.9 F (37.2 C) Oral 91 18 5' 8"  (1.727 m) 113.5 kg   Constitutional: Well-developed, well-nourished female in no acute distress.  Cardiovascular: normal rate and rhythm Respiratory: normal effort, clear to auscultation bilaterally GI: Abd soft, non-tender, gravid appropriate for gestational age.   No rebound or guarding. MS: Extremities nontender, no edema, normal ROM Neurologic: Alert and oriented x 4.  GU: Neg CVAT.  PELVIC EXAM: Speculum exam showed pooling of watery burgundy fluid in vault, unable to see ferning, but appears very watery.  Dilation: 4 Effacement (%): 50 Station: -3 Presentation: Vertex Exam by:: Hansel Feinstein, CNM    FHT:  Baseline 150 , moderate variability, accelerations present, intermittent variable decelerations Contractions: q 2 mins Irregular     Labs: No results found for this or any previous visit (from the past 24 hour(s)).  O/Negative/-- (07/06 1154)  Imaging:  No results found.  MAU Course/MDM: I have ordered labs and IV fluid bolus NST reviewed, reassuring variability and accels but concerning for intermittent decels Consult Dr Elly Modena with presentation, exam findings and she recommends admission Treatments in MAU included EFM, IV.  She had Rhophylac a month ago.   Assessment: Single IUP at 48w1dVaginal bleeding, watery Preterm Labor with dilation.  Plan: Admit to Labor and Delivery Labor team to follow  MHansel FeinsteinCNM, MSN Certified Nurse-Midwife 08/22/2021 1:22 AM

## 2021-08-22 NOTE — Progress Notes (Signed)
Patient ID: Sharon Nash, female   DOB: 05-22-1996, 25 y.o.   MRN: [id number] Sharon Nash is a 25 y.o. [phone] at [redacted]w[redacted]d admitted for  bright red vaginal bleeding and pain  Subjective: Contractions uncomfortable, has gotten 1 dose of fentanyl earlier and it helped, requests more. Bleeding still 'the same'. Denies dizziness/lightheadedness.   Objective: BP (!) 143/73    Pulse 79    Temp 99.5 F (37.5 C) (Oral)    Resp 16    Ht 5\' 8"  (1.727 m)    Wt 113.5 kg    LMP 11/23/2020 (Approximate)    BMI 38.06 kg/m  No intake/output data recorded.  FHR baseline 135 bpm, Variability: moderate, Accelerations:present, Decelerations:  Absent Toco: q 1-3  Abd non-tender  SVE:   Dilation: 3 Effacement (%): 50 Station: -3, Ballotable Exam by:: [email or web address] CNM BBOW w/ uc  BRB on thighs and small amt on towel   Labs: Lab Results  Component Value Date   WBC 7.1 06/29/2021   HGB 10.4 (L) 06/29/2021   HCT 32.4 (L) 06/29/2021   MCV 90 06/29/2021   PLT 335 06/29/2021    Assessment / Plan: Suspected partial marginal abruption, cx unchanged from last exam 4hrs ago, bleeding stable. Discussed POC w/ Dr. 07/01/2021, will continue to monitor, no augmentation for now. Admission CBC still not returned, RN to call lab.   Labor: early Fetal Wellbeing:  Category I Pain Control:  IV pain meds Pre-eclampsia: N/A I/D:   PCN for preterm/GBS unknown Anticipated MOD: NSVB  Jolayne Panther CNM, WHNP-BC 08/22/2021, 6:25 AM

## 2021-08-23 LAB — CBC
HCT: 27.5 % — ABNORMAL LOW (ref 36.0–46.0)
Hemoglobin: 8.8 g/dL — ABNORMAL LOW (ref 12.0–15.0)
MCH: 28.1 pg (ref 26.0–34.0)
MCHC: 32 g/dL (ref 30.0–36.0)
MCV: 87.9 fL (ref 80.0–100.0)
Platelets: 226 10*3/uL (ref 150–400)
RBC: 3.13 MIL/uL — ABNORMAL LOW (ref 3.87–5.11)
RDW: 16.3 % — ABNORMAL HIGH (ref 11.5–15.5)
WBC: 8.9 10*3/uL (ref 4.0–10.5)
nRBC: 0 % (ref 0.0–0.2)

## 2021-08-23 MED ORDER — LIDOCAINE HCL 1 % IJ SOLN
0.0000 mL | Freq: Once | INTRAMUSCULAR | Status: DC | PRN
  Filled 2021-08-23: qty 20

## 2021-08-23 MED ORDER — ETONOGESTREL 68 MG ~~LOC~~ IMPL
68.0000 mg | DRUG_IMPLANT | Freq: Once | SUBCUTANEOUS | Status: DC
  Filled 2021-08-23: qty 1

## 2021-08-23 MED ORDER — RHO D IMMUNE GLOBULIN 1500 UNIT/2ML IJ SOSY
300.0000 ug | PREFILLED_SYRINGE | Freq: Once | INTRAMUSCULAR | Status: AC
  Administered 2021-08-23: 300 ug via INTRAMUSCULAR
  Filled 2021-08-23: qty 2

## 2021-08-23 MED ORDER — SODIUM CHLORIDE 0.9 % IV SOLN
500.0000 mg | Freq: Once | INTRAVENOUS | Status: AC
  Administered 2021-08-23: 500 mg via INTRAVENOUS
  Filled 2021-08-23: qty 25

## 2021-08-23 NOTE — Progress Notes (Addendum)
POSTPARTUM PROGRESS NOTE  Subjective: Sharon Nash is a 25 y.o. [phone] s/p SVD at [redacted]w[redacted]d.  She reports she doing well. No acute events overnight. She denies any problems with ambulating, voiding or po intake. Denies nausea or vomiting. She has  passed flatus. Pain is well controlled.  Lochia is appropriate.  Objective: Blood pressure (!) 114/53, pulse 84, temperature 98.2 F (36.8 C), temperature source Oral, resp. rate 19, height 5\' 8"  (1.727 m), weight 113.5 kg, last menstrual period 11/23/2020, SpO2 98 %, unknown if currently breastfeeding.  Physical Exam:  General: alert, cooperative and no distress Chest: no respiratory distress Abdomen: soft, non-tender  Uterine Fundus: firm and at level of umbilicus Extremities: No calf swelling or tenderness   no edema  Recent Labs    08/22/21 0634 08/23/21 0419  HGB 7.8* 8.8*  HCT 25.0* 27.5*    Assessment/Plan: Sharon Nash is a 25 y.o. [phone] s/p SVD at [redacted]w[redacted]d for bright red bleeding concerning for placental abruption.  Routine Postpartum Care: Doing well, pain well-controlled.  -- Continue routine care, lactation support  -- Contraception: nexplanon -- Feeding: bottle  #Anemia: received 2U pRBC intrapartum, Hgb 7.8>8.8. Will give IV Venofer.    Dispo: Plan for discharge home tomorrow.  [redacted]w[redacted]d, MD PGY-2 08/23/2021 7:59 AM

## 2021-08-24 LAB — CULTURE, BETA STREP (GROUP B ONLY)

## 2021-08-24 LAB — RH IG WORKUP (INCLUDES ABO/RH)
Fetal Screen: NEGATIVE
Gestational Age(Wks): 35.1
Unit division: 0

## 2021-08-24 LAB — SURGICAL PATHOLOGY

## 2021-08-24 MED ORDER — IBUPROFEN 600 MG PO TABS: 600.0000 mg | ORAL_TABLET | Freq: Four times a day (QID) | ORAL | 0 refills | Status: DC

## 2021-08-24 NOTE — Social Work (Signed)
CLINICAL SOCIAL WORK MATERNAL/CHILD NOTE  Patient Details  Name: Sharon Nash MRN: [id number] Date of Birth: 08/22/2022  Date:  08/24/2021  Clinical Social Worker Initiating Note:  Kathrin Greathouse, [place] Date/Time: Initiated:  08/24/21/0910     Child's Name:  Sharon Nash   Biological Parents:  Mother, Father Sharon Nash 1995[birth year]07, Sharon Nash 09-26-1986)   Need for Interpreter:  None   Reason for Referral:  Current Substance Use/Substance Use During Pregnancy     Address:  [address] [place] [address]    Phone number:  [phone] Additional phone number:   Household Members/Support Persons (HM/SP):   Household Member/Support Person 1, Household Member/Support Person 2, Household Member/Support Person 3   HM/SP Name Relationship DOB or Age  HM/SP -1 Kevionna Heffler February 19, 2011  HM/SP -2 Sophia Edsel Petrin Daughter March 02, 2016  HM/SP -3        HM/SP -4        HM/SP -5        HM/SP -6        HM/SP -7        HM/SP -8          Natural Supports (not living in the home):  Spouse/significant other, Parent   Professional Supports: None   Employment: Part-time   Type of Work: Visual merchandiser   Education:  [place] arranged:    Museum/gallery curator Resources:  Medicaid   Other Resources:  Physicist, medical  , [place] Considerations Which May Impact Care:    Strengths:  Ability to meet basic needs  , Home prepared for child  , Pediatrician chosen   Psychotropic Medications:         Pediatrician:    [place] area  Pediatrician List:   [facility] ([address])  [place]      Pediatrician Fax Number:    Risk Factors/Current Problems:  Substance Use     Cognitive State:  Able to Concentrate  , Alert  , Linear Thinking  , Insightful     Mood/Affect:  Calm  , Comfortable  , Happy      CSW Assessment: CSW received consult for hx Depression and THC use. CSW met with MOB to offer support and complete assessment.    CSW met with MOB at bedside. CSW observed MOB sitting up in the bed and the infant asleep in the bassinet. MOB presented calm and welcomed CSW visit. MOB confirmed the address on file and reported a new contact number [phone]. MOB reported she and her children (see chart above) live in the home together. MOB reported that FOB Sharon Nash lives out the home however he is involved. MOB identified FOB and her mom as supports. CSW inquired how MOB has felt since giving birth. MOB reported feeling okay with a quick and early delivery. MOB reported " I knew he was going to come early because he was low for most of the pregnancy." MOB reported she prepared early and purchased all the essential items for the infant including a crib where the infant will sleep. MOB reported during the pregnancy she felt emotionally up and down. MOB reported a history of depression. MOB reported she was diagnosed with depression in 2017 following the birth of her daughter. MOB reported at the time she and FOB were having  relationship issues. MOB reported no therapy or medication treatment. CSW inquired about MOB coping strategies. MOB reported she will clean, read, listen to music, exercise on her bike and "just stay busy." CSW encouraged MOB to continue using her coping strategies. CSW provided education regarding the baby blues period vs. perinatal mood disorders, discussed treatment and gave resources for mental health follow up if concerns arise.  CSW recommended MOB complete self-evaluation during the postpartum time period using the New Mom Checklist from Postpartum Progress and encouraged MOB to contact a medical professional if symptoms are noted at any time. MOB reported she feels comfortable reaching out to her doctor if she has concerns. MOB denied SI/HI and domestic violence.   CSW  inquired about MOB substance use during the pregnancy. MOB reported she smoked marijuana twice a week until she was five months pregnant because of lack of appetite. CSW informed MOB about the hospital drug screen policy. MOB aware that CSW follow the infants CDS and make a report to CPS, if warranted. MOB reported understanding. MOB denied CPS history of open or open cases.   CSW provided review of Sudden Infant Death Syndrome (SIDS) precautions. MOB has chosen [facility] for the infant's follow up care and will have transportation to appointments.   -Infant's UDS negative, CSW will follow the infant CDS.  CSW identifies no further need for intervention and no barriers to discharge at this time.    CSW Plan/Description:  Sudden Infant Death Syndrome (SIDS) Education, CSW Will Continue to Monitor Umbilical Cord Tissue Drug Screen Results and Make Report if Warranted, [place], Perinatal Mood and Anxiety Disorder (PMADs) Education, No Further Intervention Required/No Barriers to Discharge    Lia Hopping, LCSW 08/24/2021, 10:59 AM

## 2021-08-26 LAB — TYPE AND SCREEN
ABO/RH(D): O NEG
Antibody Screen: POSITIVE
Unit division: 0
Unit division: 0
Unit division: 0
Unit division: 0

## 2021-08-26 LAB — BPAM RBC
Blood Product Expiration Date: 202212192359
Blood Product Expiration Date: 202212192359
Blood Product Expiration Date: 202212282359
Blood Product Expiration Date: 202212282359
ISSUE DATE / TIME: 202212140921
ISSUE DATE / TIME: 202212141208
Unit Type and Rh: 9500
Unit Type and Rh: 9500
Unit Type and Rh: 9500
Unit Type and Rh: 9500

## 2021-09-04 ENCOUNTER — Encounter: Payer: Medicaid Other | Admitting: Women's Health

## 2021-09-05 ENCOUNTER — Telehealth (HOSPITAL_COMMUNITY): Payer: Self-pay | Admitting: *Deleted

## 2021-09-05 DIAGNOSIS — F4320 Adjustment disorder, unspecified: Secondary | ICD-10-CM | POA: Diagnosis not present

## 2021-09-05 NOTE — Telephone Encounter (Signed)
Attempted Hospital Discharge Follow-Up Call.  No answer.  Unable to leave a message. 

## 2021-09-26 ENCOUNTER — Encounter: Payer: Self-pay | Admitting: Women's Health

## 2021-09-26 ENCOUNTER — Other Ambulatory Visit: Payer: Self-pay

## 2021-09-26 ENCOUNTER — Ambulatory Visit (INDEPENDENT_AMBULATORY_CARE_PROVIDER_SITE_OTHER): Payer: Medicaid Other | Admitting: Women's Health

## 2021-09-26 DIAGNOSIS — F53 Postpartum depression: Secondary | ICD-10-CM | POA: Diagnosis not present

## 2021-09-26 DIAGNOSIS — Z3202 Encounter for pregnancy test, result negative: Secondary | ICD-10-CM

## 2021-09-26 DIAGNOSIS — Z30017 Encounter for initial prescription of implantable subdermal contraceptive: Secondary | ICD-10-CM | POA: Diagnosis not present

## 2021-09-26 LAB — POCT URINE PREGNANCY: Preg Test, Ur: NEGATIVE

## 2021-09-26 MED ORDER — SERTRALINE HCL 25 MG PO TABS: 25.0000 mg | ORAL_TABLET | Freq: Every day | ORAL | 6 refills | Status: DC

## 2021-09-26 MED ORDER — ETONOGESTREL 68 MG ~~LOC~~ IMPL
68.0000 mg | DRUG_IMPLANT | Freq: Once | SUBCUTANEOUS | Status: AC
  Administered 2021-09-26: 68 mg via SUBCUTANEOUS

## 2021-09-26 NOTE — Progress Notes (Signed)
POSTPARTUM VISIT Patient name: Sharon Nash MRN [id number]  Date of birth: 25-Oct-1995 Chief Complaint:   Postpartum Care (Interested in [place])  History of Present Illness:   Sharon Nash is a 26 y.o. [phone] African American female being seen today for a postpartum visit. She is 5 weeks postpartum following a spontaneous vaginal delivery at 35.1 gestational weeks. IOL: yes, for marginal placental abruption. Anesthesia: none.  Laceration: none.  Complications: none. Inpatient contraception: no.   Pregnancy uncomplicated. Tobacco use: no. Substance use disorder: no. Last pap smear: 03/14/21 and results were ASCUS w/ HRHPV negative. Next pap smear due: 2025 Patient's last menstrual period was 11/23/2020 (approximate).  Postpartum course has been uncomplicated. Bleeding none. Bowel function is  some constipation . Bladder function is normal. Urinary incontinence? no, fecal incontinence? no Patient is not sexually active. Last sexual activity: prior to birth of baby. Desired contraception:  Nexplanon and still wants BTL, discussed Nexplanon more effective than BTL, still wants both . Patient does not want a pregnancy in the future.  Desired family size is 3 children.   The pregnancy intention screening data noted above was reviewed. Potential methods of contraception were discussed. The patient elected to proceed with No data recorded.  Edinburgh Postpartum Depression Screening: neg, however pt feels she is having problems w/ PPD, sleeping ok, appetite wnl, still finds joy in things she used to, denies SI/HI/II. Used to smoke THC to help, but she quit. Has good support at home. Discussed therapy and meds, pt wants meds, declines therapy  Edinburgh Postnatal Depression Scale - 09/26/21 1348       Edinburgh Postnatal Depression Scale:  In the Past 7 Days   I have been able to laugh and see the funny side of things. 1    I have looked forward with enjoyment to things. 0    I have  blamed myself unnecessarily when things went wrong. 0    I have been anxious or worried for no good reason. 0    I have felt scared or panicky for no good reason. 0    Things have been getting on top of me. 2    I have been so unhappy that I have had difficulty sleeping. 2    I have felt sad or miserable. 1    I have been so unhappy that I have been crying. 1    The thought of harming myself has occurred to me. 0    Edinburgh Postnatal Depression Scale Total 7             GAD 7 : Generalized Anxiety Score 03/14/2021  Nervous, Anxious, on Edge 2  Control/stop worrying 1  Worry too much - different things 1  Trouble relaxing 1  Restless 1  Easily annoyed or irritable 1  Afraid - awful might happen 0  Total GAD 7 Score 7     Baby's course has been uncomplicated. Baby is feeding by breast and bottle: milk supply adequate. Infant has a pediatrician/family doctor? Yes.  Childcare strategy if returning to work/school: n/a-stay at home mom.  Pt has material needs met for her and baby: Yes.   Review of Systems:   Pertinent items are noted in HPI Denies Abnormal vaginal discharge w/ itching/odor/irritation, headaches, visual changes, shortness of breath, chest pain, abdominal pain, severe nausea/vomiting, or problems with urination or bowel movements. Pertinent History Reviewed:  Reviewed past medical,surgical, obstetrical and family history.  Reviewed problem list, medications and allergies. OB History  Gravida Para Term Preterm AB Living  4 3 2 1 1 3   SAB IAB Ectopic Multiple Live Births  1 0 0 0 3    # Outcome Date GA Lbr Len/2nd Weight Sex Delivery Anes PTL Lv  4 Preterm 08/22/21 87w1d16:30 / 00:06 4 lb 14.3 oz (2.22 kg) M Vag-Spont None  LIV  3 SAB 06/14/20 669w5d       2 Term 03/02/16 3870w2d:33 / 00:29 7 lb 4.8 oz (3.31 kg) F Vag-Spont Other N LIV  1 Term 02/19/11 40w30w0dlb 6 oz (3.345 kg) M Vag-Spont  N LIV   Physical Assessment:   Vitals:   09/26/21 1347  BP: 122/80   Pulse: 88  Weight: 231 lb (104.8 kg)  Height: 5' 8"  (1.727 m)  Body mass index is 35.12 kg/m.       Physical Examination:   General appearance: alert, well appearing, and in no distress  Mental status: alert, oriented to person, place, and time  Skin: warm & dry   Cardiovascular: normal heart rate noted   Respiratory: normal respiratory effort, no distress   Breasts: deferred, no complaints   Abdomen: soft, non-tender   Pelvic: examination not indicated. Thin prep pap obtained: No  Rectal: not examined  Extremities: Edema: none   Chaperone: N/A         Results for orders placed or performed in visit on 09/26/21 (from the past 24 hour(s))  POCT urine pregnancy   Collection Time: 09/26/21  2:01 PM  Result Value Ref Range   Preg Test, Ur Negative Negative     NEXPLANON INSERTION  Risks/benefits/side effects of Nexplanon have been discussed and her questions have been answered.  Specifically, a failure rate of 09/998 has been reported, with an increased failure rate if pt takes [place]/or antiseizure medicaitons.  She is aware of the common side effect of irregular bleeding, which the incidence of decreases over time. Signed copy of informed consent in chart.   Time out was performed.  She is right-handed, so her left arm, approximately 10cm from the medial epicondyle and 3-5cm posterior to the sulcus, was cleansed with alcohol and anesthetized with 2cc of 2% Lidocaine.  The area was cleansed again with betadine and the Nexplanon was inserted per manufacturer's recommendations without difficulty.  3 steri-strips and pressure bandage were applied. The patient tolerated the procedure well.   Assessment & Plan:  1) Postpartum exam 2) 5 wks s/p spontaneous vaginal delivery at 35.1wks after IOL d/t marginal abruption 3) breast & bottle feeding 4) Depression screening 5) Nexplanon insertion Pt was instructed to keep the area clean and dry, remove pressure bandage in 24  hours, and keep insertion site covered with the steri-strip for 3-5 days.  Condoms for 2 weeks.  She was given a card indicating date Nexplanon was inserted and date it needs to be removed. Follow-up PRN problems. 6) PPD> rx zoloft 25mg46mclines therapy/IBH referral, f/u 4wks 7) [place] she wants BTL+Nexplanon> discussed Nexplanon more effective than BTL, still wants both, will make appt w/ MD to discuss  Essential components of care per ACOG recommendations:  1.  Mood and well being:  If positive depression screen, discussed and plan developed.  If using tobacco we discussed reduction/cessation and risk of relapse If current substance abuse, we discussed and referral to local resources was offered.   2. Infant care and feeding:  If breastfeeding, discussed returning to work, pumping, breastfeeding-associated pain, guidance regarding  return to fertility while lactating if not using another method. If needed, patient was provided with a letter to be allowed to pump q 2-3hrs to support lactation in a private location with access to a refrigerator to store breastmilk.   Recommended that all caregivers be immunized for flu, pertussis and other preventable communicable diseases If pt does not have material needs met for her/baby, referred to local resources for help obtaining these.  3. Sexuality, contraception and birth spacing Provided guidance regarding sexuality, management of dyspareunia, and resumption of intercourse Discussed avoiding interpregnancy interval <42mhs and recommended birth spacing of 18 months  4. Sleep and fatigue Discussed coping options for fatigue and sleep disruption Encouraged family/partner/community support of 4 hrs of uninterrupted sleep to help with mood and fatigue  5. Physical recovery  If pt had a C/S, assessed incisional pain and providing guidance on normal vs prolonged recovery If pt had a laceration, perineal healing and pain reviewed.  If urinary or fecal  incontinence, discussed management and referred to PT or uro/gyn if indicated  Patient is safe to resume physical activity. Discussed attainment of healthy weight.  6.  Chronic disease management Discussed pregnancy complications if any, and their implications for future childbearing and long-term maternal health. Review recommendations for prevention of recurrent pregnancy complications, such as 17 hydroxyprogesterone caproate to reduce risk for recurrent PTB not applicable, or aspirin to reduce risk of preeclampsia not applicable. Pt had GDM: No. If yes, 2hr GTT scheduled: not applicable. Reviewed medications and non-pregnant dosing including consideration of whether pt is breastfeeding using a reliable resource such as LactMed: yes Referred for f/u w/ PCP or subspecialist providers as indicated: not applicable  7. Health maintenance Mammogram at [place] earlier if indicated Pap smears as indicated  Meds:  Meds ordered this encounter  Medications   sertraline (ZOLOFT) 25 MG tablet    Sig: Take 1 tablet (25 mg total) by mouth daily.    Dispense:  30 tablet    Refill:  6    Order Specific Question:   Supervising Provider    Answer:   EFlorian Buff[[id number]]    Follow-up: Return in about 4 weeks (around 10/24/2021) for med f/u w/ MD only, wants to discuss BTL.   Orders Placed This Encounter  Procedures   POCT urine pregnancy    [facility] [facility]18/2023 2:41 PM

## 2021-09-26 NOTE — Addendum Note (Signed)
Addended by: Colen Darling on: 09/26/2021 02:55 PM   Modules accepted: Orders

## 2021-09-26 NOTE — Patient Instructions (Addendum)
Keep the area clean and dry.  You can remove the big bandage in 24 hours, and the small steri-strip bandage in 3-5 days.  A back up method, such as condoms, should be used for two weeks. You may have irregular vaginal bleeding for the first 6 months after the Nexplanon is placed, then the bleeding usually lightens and it is possible that you may not have any periods.  If you have any concerns, please give [place] a call.    Constipation Drink plenty of fluid, preferably water, throughout the day Eat foods high in fiber such as fruits, vegetables, and grains Exercise, such as walking, is a good way to keep your bowels regular Drink warm fluids, especially warm prune juice, or decaf coffee Eat a 1/2 cup of real oatmeal (not instant), 1/2 cup applesauce, and 1/2-1 cup warm prune juice every day If needed, you may take Colace (docusate sodium) stool softener once or twice a day to help keep the stool soft. If you are pregnant, wait until you are out of your first trimester (12-14 weeks of pregnancy) If you still are having problems with constipation, you may take Miralax once daily as needed to help keep your bowels regular.  If you are pregnant, wait until you are out of your first trimester (12-14 weeks of pregnancy)

## 2021-10-23 ENCOUNTER — Ambulatory Visit: Payer: Medicaid Other | Admitting: Obstetrics & Gynecology

## 2021-11-20 ENCOUNTER — Other Ambulatory Visit: Payer: Self-pay

## 2021-11-20 ENCOUNTER — Ambulatory Visit
Admission: EM | Admit: 2021-11-20 | Discharge: 2021-11-20 | Disposition: A | Discharge status: Home / Self Care | Payer: Medicaid Other | Attending: Urgent Care | Admitting: Urgent Care

## 2021-11-20 DIAGNOSIS — R112 Nausea with vomiting, unspecified: Secondary | ICD-10-CM | POA: Diagnosis not present

## 2021-11-20 DIAGNOSIS — A084 Viral intestinal infection, unspecified: Secondary | ICD-10-CM

## 2021-11-20 DIAGNOSIS — R519 Headache, unspecified: Secondary | ICD-10-CM

## 2021-11-20 DIAGNOSIS — R197 Diarrhea, unspecified: Secondary | ICD-10-CM

## 2021-11-20 MED ORDER — ONDANSETRON 8 MG PO TBDP: 8.0000 mg | ORAL_TABLET | Freq: Three times a day (TID) | ORAL | 0 refills | Status: DC | PRN

## 2021-11-20 MED ORDER — NAPROXEN 500 MG PO TABS: 500.0000 mg | ORAL_TABLET | Freq: Two times a day (BID) | ORAL | 0 refills | Status: DC

## 2021-11-20 MED ORDER — LOPERAMIDE HCL 2 MG PO CAPS: 2.0000 mg | ORAL_CAPSULE | Freq: Two times a day (BID) | ORAL | 0 refills | Status: DC | PRN

## 2021-11-20 NOTE — Discharge Instructions (Addendum)

## 2021-11-20 NOTE — ED Provider Notes (Signed)
?[place] ? ? ?MRN: [id number] DOB: Jan 28, 1996 ? ?Subjective:  ? ?Sharon Nash is a 26 y.o. female presenting for 1 day history of acute onset persistent nausea, vomiting, diarrhea.  She is also had 1 to 18-monthhistory of persistent and recurring headaches with photophobia.  Has been using Tylenol regularly for this.  Denies confusion, weakness, numbness or tingling, bloody stools, hematemesis.  No history of GI disorders.  No history of long distance travel outside the country.  No recent antibiotic use.  No hospitalizations. ? ?No current facility-administered medications for this encounter. ? ?Current Outpatient Medications:  ?  acetaminophen (TYLENOL) 500 MG tablet, Take 500 mg by mouth every 6 (six) hours as needed., Disp: , Rfl:  ?  albuterol (PROVENTIL) (5 MG/ML) 0.5% nebulizer solution, Take 0.5 mLs (2.5 mg total) by nebulization every 6 (six) hours as needed for wheezing or shortness of breath., Disp: 20 mL, Rfl: 2 ?  albuterol (VENTOLIN HFA) 108 (90 Base) MCG/ACT inhaler, Inhale 2 puffs into the lungs every 4 (four) hours as needed for wheezing or shortness of breath., Disp: 18 g, Rfl: 2 ?  budesonide-formoterol (SYMBICORT) 160-4.5 MCG/ACT inhaler, Inhale 2 puffs into the lungs in the morning and at bedtime., Disp: 1 each, Rfl: 12 ?  cetirizine (ZYRTEC) 10 MG tablet, Take 10 mg by mouth., Disp: , Rfl:  ?  fluticasone (FLONASE) 50 MCG/ACT nasal spray, Place into both nostrils daily., Disp: , Rfl:  ?  Prenatal Vit-Fe Fumarate-FA (MULTIVITAMIN-PRENATAL) 27-0.8 MG TABS tablet, Take 1 tablet by mouth daily at 12 noon., Disp: , Rfl:  ?  Respiratory Therapy Supplies (NEBULIZER/TUBING/MOUTHPIECE) KIT, Use as directed with nebulizer, Disp: 1 kit, Rfl: 0 ?  sertraline (ZOLOFT) 25 MG tablet, Take 1 tablet (25 mg total) by mouth daily., Disp: 30 tablet, Rfl: 6  ? ?Allergies  ?Allergen Reactions  ? Shrimp [Shellfish Allergy] Anaphylaxis  ? ? ?Past Medical History:  ?Diagnosis Date  ? Asthma    ? Chronic abdominal pain   ? Chronic knee pain   ? [place]  ? bipap only, not intubated, due to asthma exacerbation  ?  ? ?Past Surgical History:  ?Procedure Laterality Date  ? NO PAST SURGERIES    ? None    ? ? ?Family History  ?Problem Relation Age of Onset  ? Diabetes Maternal Grandmother   ? Hypertension Maternal Grandmother   ? Diabetes Maternal Grandfather   ? Asthma Maternal Grandfather   ? Asthma Sister   ? Breast cancer Maternal Aunt   ? Colon cancer Neg Hx   ? ? ?Social History  ? ?Tobacco Use  ? Smoking status: Never  ? Smokeless tobacco: Never  ?Vaping Use  ? Vaping Use: Former  ? Quit date: 07/23/2021  ?Substance Use Topics  ? Alcohol use: Not Currently  ?  Comment: occ  ? Drug use: Not Currently  ?  Types: Marijuana  ?  Comment: LAST SMOKE YESTERDAY  ? ? ?ROS ? ? ?Objective:  ? ?Vitals: ?BP 131/83   Pulse 86   Temp 99 ?F (37.2 ?C)   Resp 18   LMP 11/16/2021 (Exact Date)   SpO2 98%   Breastfeeding No  ? ?Physical Exam ?Constitutional:   ?   General: She is not in acute distress. ?   Appearance: Normal appearance. She is well-developed. She is not ill-appearing, toxic-appearing or diaphoretic.  ?HENT:  ?   Head: Normocephalic and atraumatic.  ?   Nose: Nose normal.  ?  Mouth/Throat:  ?   Mouth: Mucous membranes are moist.  ?   Pharynx: Oropharynx is clear.  ?Eyes:  ?   General: No scleral icterus.    ?   Right eye: No discharge.     ?   Left eye: No discharge.  ?   Extraocular Movements: Extraocular movements intact.  ?   Conjunctiva/sclera: Conjunctivae normal.  ?Neck:  ?   Meningeal: Brudzinski's sign and Kernig's sign absent.  ?Cardiovascular:  ?   Rate and Rhythm: Normal rate.  ?   Heart sounds: No murmur heard. ?  No friction rub. No gallop.  ?Pulmonary:  ?   Effort: Pulmonary effort is normal. No respiratory distress.  ?   Breath sounds: No stridor. No wheezing, rhonchi or rales.  ?Chest:  ?   Chest wall: No tenderness.  ?Abdominal:  ?   General: Bowel  sounds are normal. There is no distension.  ?   Palpations: Abdomen is soft. There is no mass.  ?   Tenderness: There is no abdominal tenderness. There is no right CVA tenderness, left CVA tenderness, guarding or rebound.  ?Musculoskeletal:  ?   Cervical back: No rigidity.  ?Skin: ?   General: Skin is warm and dry.  ?Neurological:  ?   General: No focal deficit present.  ?   Mental Status: She is alert and oriented to person, place, and time.  ?   Cranial Nerves: No cranial nerve deficit, dysarthria or facial asymmetry.  ?   Motor: No weakness.  ?   Coordination: Coordination normal.  ?   Gait: Gait normal.  ?Psychiatric:     ?   Mood and Affect: Mood normal.     ?   Behavior: Behavior normal.     ?   Thought Content: Thought content normal.     ?   Judgment: Judgment normal.  ? ? ? ?Assessment and Plan :  ? ?PDMP not reviewed this encounter. ? ?1. Viral gastroenteritis   ?2. Nausea vomiting and diarrhea   ?3. Generalized headache   ? ?No signs of an acute encephalopathy.  Offered naproxen for headaches.  Recommended follow-up with the headache clinic of [place]. Will manage for suspected viral gastroenteritis with supportive care.  Recommended patient hydrate well, eat light meals and maintain electrolytes.  Will use Zofran and Imodium for nausea, vomiting and diarrhea. Counseled patient on potential for adverse effects with medications prescribed/recommended today, ER and return-to-clinic precautions discussed, patient verbalized understanding. ? ?  ?Jaynee Eagles, PA-C ?11/20/21 1422 ? ?

## 2021-11-20 NOTE — ED Triage Notes (Signed)
Pt reports headache, chills, vomiting sand diarrhea since 0300 am today.  ?

## 2022-02-26 ENCOUNTER — Encounter (HOSPITAL_COMMUNITY): Payer: Self-pay

## 2022-02-26 ENCOUNTER — Other Ambulatory Visit: Payer: Self-pay

## 2022-02-26 DIAGNOSIS — J45909 Unspecified asthma, uncomplicated: Secondary | ICD-10-CM | POA: Diagnosis not present

## 2022-02-26 DIAGNOSIS — Z87891 Personal history of nicotine dependence: Secondary | ICD-10-CM | POA: Diagnosis not present

## 2022-02-26 DIAGNOSIS — R0602 Shortness of breath: Secondary | ICD-10-CM | POA: Diagnosis present

## 2022-02-26 DIAGNOSIS — J069 Acute upper respiratory infection, unspecified: Secondary | ICD-10-CM | POA: Insufficient documentation

## 2022-02-26 DIAGNOSIS — Z20822 Contact with and (suspected) exposure to covid-19: Secondary | ICD-10-CM | POA: Diagnosis not present

## 2022-02-26 MED ORDER — ALBUTEROL SULFATE HFA 108 (90 BASE) MCG/ACT IN AERS
2.0000 | INHALATION_SPRAY | RESPIRATORY_TRACT | Status: DC | PRN
  Filled 2022-02-26 (×2): qty 6.7

## 2022-02-26 NOTE — ED Triage Notes (Addendum)
Pt presents with nasal congestion, cough, ShOB, and vomiting that started yesterday. Pt with hx of asthma.Pt with labored resp. Able to speak in short sentences between breaths.

## 2022-02-27 ENCOUNTER — Emergency Department (HOSPITAL_COMMUNITY)
Admission: EM | Admit: 2022-02-27 | Discharge: 2022-02-27 | Disposition: A | Discharge status: Home / Self Care | Payer: Medicaid Other | Attending: Emergency Medicine | Admitting: Emergency Medicine

## 2022-02-27 DIAGNOSIS — J069 Acute upper respiratory infection, unspecified: Secondary | ICD-10-CM

## 2022-02-27 DIAGNOSIS — J45901 Unspecified asthma with (acute) exacerbation: Secondary | ICD-10-CM

## 2022-02-27 LAB — SARS CORONAVIRUS 2 BY RT PCR: SARS Coronavirus 2 by RT PCR: NEGATIVE

## 2022-02-27 MED ORDER — ALUM & MAG HYDROXIDE-SIMETH 200-200-20 MG/5ML PO SUSP
30.0000 mL | Freq: Once | ORAL | Status: AC
  Administered 2022-02-27: 30 mL via ORAL
  Filled 2022-02-27: qty 30

## 2022-02-27 MED ORDER — DICYCLOMINE HCL 10 MG PO CAPS
10.0000 mg | ORAL_CAPSULE | Freq: Once | ORAL | Status: AC
  Administered 2022-02-27: 10 mg via ORAL
  Filled 2022-02-27: qty 1

## 2022-02-27 MED ORDER — DEXAMETHASONE SODIUM PHOSPHATE 10 MG/ML IJ SOLN
10.0000 mg | Freq: Once | INTRAMUSCULAR | Status: AC
  Administered 2022-02-27: 10 mg via INTRAMUSCULAR
  Filled 2022-02-27: qty 1

## 2022-02-27 NOTE — Discharge Instructions (Signed)
You were evaluated in the Emergency Department and after careful evaluation, we did not find any emergent condition requiring admission or further testing in the hospital.  Your exam/testing today is overall reassuring.  Symptoms seem to be due to a viral illness, which may be flaring her asthma.  Continue the inhaler at home, Tylenol or Motrin for discomfort.  Please return to the Emergency Department if you experience any worsening of your condition.   Thank you for allowing [place] to be a part of your care.

## 2022-02-27 NOTE — ED Provider Notes (Signed)
[facility]-EMERGENCY DEPT [facility] Emergency Department Provider Note MRN:  [id number]  Arrival date & time: 02/27/22     Chief Complaint   Shortness of Breath (/)   History of Present Illness   Sharon Nash is a 26 y.o. year-old female with a history of asthma presenting to the ED with chief complaint of shortness of breath.  Cough, nasal congestion, started feeling sick a couple days ago.  Patient's 94-month-old got sick first with similar symptoms.  Feeling wheezy today, feels like her asthma is flaring up.  Review of Systems  A thorough review of systems was obtained and all systems are negative except as noted in the HPI and PMH.   Patient's Health History    Past Medical History:  Diagnosis Date   Asthma    Chronic abdominal pain    Chronic knee pain    Respiratory failure, acute (HCC) 06/2012   bipap only, not intubated, due to asthma exacerbation    Past Surgical History:  Procedure Laterality Date   NO PAST SURGERIES     None      Family History  Problem Relation Age of Onset   Diabetes Maternal Grandmother    Hypertension Maternal Grandmother    Diabetes Maternal Grandfather    Asthma Maternal Grandfather    Asthma Sister    Breast cancer Maternal Aunt    Colon cancer Neg Hx     Social History   Socioeconomic History   Marital status: Single    Spouse name: Not on file   Number of children: Not on file   Years of education: Not on file   Highest education level: Not on file  Occupational History   Not on file  Tobacco Use   Smoking status: Never   Smokeless tobacco: Never  Vaping Use   Vaping Use: Former   Quit date: 07/23/2021  Substance and Sexual Activity   Alcohol use: Not Currently    Comment: occ   Drug use: Not Currently    Types: Marijuana   Sexual activity: Yes    Birth control/protection: None  Other Topics Concern   Not on file  Social History Narrative   Not on file   Social Determinants of Health   Financial  Resource Strain: Medium Risk (09/26/2021)   Overall Financial Resource Strain (CARDIA)    Difficulty of Paying Living Expenses: Somewhat hard  Food Insecurity: No Food Insecurity (09/26/2021)   Hunger Vital Sign    Worried About Running Out of Food in the Last Year: Never true    Ran Out of Food in the Last Year: Never true  Transportation Needs: No Transportation Needs (09/26/2021)   PRAPARE - Administrator, Civil Service (Medical): No    Lack of Transportation (Non-Medical): No  Physical Activity: Insufficiently Active (09/26/2021)   Exercise Vital Sign    Days of Exercise per Week: 3 days    Minutes of Exercise per Session: 20 min  Stress: Stress Concern Present (09/26/2021)   [employer] of Occupational Health - Occupational Stress Questionnaire    Feeling of Stress : To some extent  Social Connections: Socially Isolated (09/26/2021)   Social Connection and Isolation Panel [NHANES]    Frequency of Communication with Friends and Family: More than three times a week    Frequency of Social Gatherings with Friends and Family: Once a week    Attends Religious Services: Never    Database administrator or Organizations: No    Attends  Club or Organization Meetings: Never    Marital Status: Never married  Intimate Partner Violence: Not At Risk (09/26/2021)   Humiliation, Afraid, Rape, and Kick questionnaire    Fear of Current or Ex-Partner: No    Emotionally Abused: No    Physically Abused: No    Sexually Abused: No     Physical Exam   Vitals:   02/26/22 2326  Pulse: 82  Resp: 20  Temp: 99.7 F (37.6 C)  SpO2: 99%    CONSTITUTIONAL: Well-appearing, NAD NEURO/PSYCH:  Alert and oriented x 3, no focal deficits EYES:  eyes equal and reactive ENT/NECK:  no LAD, no JVD CARDIO: Regular rate, well-perfused, normal S1 and S2 PULM:  CTAB no wheezing or rhonchi, no increased work of breathing GI/GU:  non-distended, non-tender MSK/SPINE:  No gross deformities, no  edema SKIN:  no rash, atraumatic   *Additional and/or pertinent findings included in MDM below  Diagnostic and Interventional Summary    EKG Interpretation  Date/Time:    Ventricular Rate:    PR Interval:    QRS Duration:   QT Interval:    QTC Calculation:   R Axis:     Text Interpretation:         Labs Reviewed  SARS CORONAVIRUS 2 BY RT PCR    No orders to display    Medications  albuterol (VENTOLIN HFA) 108 (90 Base) MCG/ACT inhaler 2 puff (has no administration in time range)  dexamethasone (DECADRON) injection 10 mg (10 mg Intramuscular Given 02/27/22 0227)  alum & mag hydroxide-simeth (MAALOX/MYLANTA) 200-200-20 MG/5ML suspension 30 mL (30 mLs Oral Given 02/27/22 0225)  dicyclomine (BENTYL) capsule 10 mg (10 mg Oral Given 02/27/22 0224)     Procedures  /  Critical Care Procedures  ED Course and Medical Decision Making  Initial Impression and Ddx Patient is sitting comfortably with normal vital signs, no respiratory distress.  Symptoms seem consistent with upper respiratory infection.  Provided reassurance, return precautions.  Past medical/surgical history that increases complexity of ED encounter: Asthma  Interpretation of Diagnostics COVID test negative.  Patient Reassessment and Ultimate Disposition/Management     Discharge home  Patient management required discussion with the following services or consulting groups:  None  Complexity of Problems Addressed Acute complicated illness or Injury  Additional Data Reviewed and Analyzed Further history obtained from: None  Additional Factors Impacting ED Encounter Risk None  Elmer Sow. Pilar Plate, MD [facility] Emergency Medicine [facility] [email or web address]  Final Clinical Impressions(s) / ED Diagnoses     ICD-10-CM   1. Viral URI with cough  J06.9     2. Mild asthma with exacerbation, unspecified whether persistent  J45.901       ED Discharge Orders     None         Discharge Instructions Discussed with and Provided to Patient:    Discharge Instructions      You were evaluated in the Emergency Department and after careful evaluation, we did not find any emergent condition requiring admission or further testing in the hospital.  Your exam/testing today is overall reassuring.  Symptoms seem to be due to a viral illness, which may be flaring her asthma.  Continue the inhaler at home, Tylenol or Motrin for discomfort.  Please return to the Emergency Department if you experience any worsening of your condition.   Thank you for allowing [place] to be a part of your care.      Sabas Sous, MD 02/27/22 [phone]

## 2022-08-12 ENCOUNTER — Ambulatory Visit (INDEPENDENT_AMBULATORY_CARE_PROVIDER_SITE_OTHER): Payer: Medicaid Other

## 2022-08-12 ENCOUNTER — Ambulatory Visit
Admission: EM | Admit: 2022-08-12 | Discharge: 2022-08-12 | Disposition: A | Discharge status: Home / Self Care | Payer: Medicaid Other | Attending: Nurse Practitioner | Admitting: Nurse Practitioner

## 2022-08-12 DIAGNOSIS — R0602 Shortness of breath: Secondary | ICD-10-CM | POA: Diagnosis not present

## 2022-08-12 DIAGNOSIS — J4541 Moderate persistent asthma with (acute) exacerbation: Secondary | ICD-10-CM

## 2022-08-12 DIAGNOSIS — R059 Cough, unspecified: Secondary | ICD-10-CM | POA: Diagnosis not present

## 2022-08-12 DIAGNOSIS — H6121 Impacted cerumen, right ear: Secondary | ICD-10-CM

## 2022-08-12 DIAGNOSIS — J45901 Unspecified asthma with (acute) exacerbation: Secondary | ICD-10-CM | POA: Diagnosis not present

## 2022-08-12 MED ORDER — BUDESONIDE-FORMOTEROL FUMARATE 160-4.5 MCG/ACT IN AERO: 2.0000 | INHALATION_SPRAY | Freq: Two times a day (BID) | RESPIRATORY_TRACT | 2 refills | Status: DC

## 2022-08-12 MED ORDER — ALBUTEROL SULFATE (2.5 MG/3ML) 0.083% IN NEBU: 2.5000 mg | INHALATION_SOLUTION | Freq: Four times a day (QID) | RESPIRATORY_TRACT | 2 refills | Status: DC | PRN

## 2022-08-12 MED ORDER — ALBUTEROL SULFATE HFA 108 (90 BASE) MCG/ACT IN AERS
2.0000 | INHALATION_SPRAY | Freq: Once | RESPIRATORY_TRACT | Status: AC
  Administered 2022-08-12: 2 via RESPIRATORY_TRACT

## 2022-08-12 MED ORDER — ALBUTEROL SULFATE HFA 108 (90 BASE) MCG/ACT IN AERS: 2.0000 | INHALATION_SPRAY | RESPIRATORY_TRACT | 2 refills | Status: DC | PRN

## 2022-08-12 MED ORDER — PREDNISONE 50 MG PO TABS: ORAL_TABLET | ORAL | 0 refills | Status: DC

## 2022-08-12 MED ORDER — ALBUTEROL SULFATE (2.5 MG/3ML) 0.083% IN NEBU
2.5000 mg | INHALATION_SOLUTION | Freq: Once | RESPIRATORY_TRACT | Status: AC
  Administered 2022-08-12: 2.5 mg via RESPIRATORY_TRACT

## 2022-08-12 MED ORDER — METHYLPREDNISOLONE SODIUM SUCC 125 MG IJ SOLR
125.0000 mg | Freq: Once | INTRAMUSCULAR | Status: AC
  Administered 2022-08-12: 125 mg via INTRAMUSCULAR

## 2022-08-12 NOTE — ED Provider Notes (Signed)
[facility]    CSN: [id number] Arrival date & time: 08/12/22  1720      History   Chief Complaint Chief Complaint  Patient presents with   Medication Refill   Shortness of Breath   Chest Pain    HPI Sharon Nash is a 26 y.o. female.   The history is provided by the patient.   Patient presents with a 2-day history of wheezing, shortness of breath, and chest tightness.  Patient also complains of right ear fullness, and feeling clogged.  Patient reports a history of asthma.  States that she has ran out of her albuterol inhaler, Symbicort, and albuterol nebulizer solution.  She denies fever, chills, cough, inability to speak in a complete sentence, or GI symptoms.  Patient states that she has not had any recent hospitalizations.  States that her asthma is usually under control. Past Medical History:  Diagnosis Date   Asthma    Chronic abdominal pain    Chronic knee pain    Respiratory failure, acute ([place]) 06/2012   bipap only, not intubated, due to asthma exacerbation    Patient Active Problem List   Diagnosis Date Noted   Postpartum depression 09/26/2021   Nexplanon insertion 09/26/2021   Preterm delivery 08/22/2021   Abnormal chromosomal and genetic finding on antenatal screening mother 03/28/2021   Abnormal Pap smear of cervix 03/20/2021   Marijuana use 03/14/2021   Encounter for supervision of normal pregnancy, antepartum 03/07/2021   Rh negative state in antepartum period 03/07/2021   Gastroesophageal reflux disease without esophagitis 01/27/2017   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 Authorizing Provider  acetaminophen (TYLENOL) 500 MG tablet Take 500 mg by mouth every 6 (six) hours as needed.   Yes [provider]  albuterol (PROVENTIL) (5 MG/ML) 0.5% nebulizer solution Take 0.5 mLs (2.5 mg total) by nebulization every 6 (six) hours as needed for wheezing or shortness of breath. 05/15/21  Yes Hagler, Aaron Edelman, MD  albuterol (VENTOLIN HFA) 108 (90 Base) MCG/ACT inhaler Inhale 2 puffs into the lungs every 4 (four) hours as needed for wheezing or shortness of breath. 06/12/21  Yes Melynda Ripple, MD  cetirizine (ZYRTEC) 10 MG tablet Take 10 mg by mouth.   Yes [provider]  fluticasone (FLONASE) 50 MCG/ACT nasal spray Place into both nostrils daily.   Yes [provider]  Respiratory Therapy Supplies (NEBULIZER/TUBING/MOUTHPIECE) KIT Use as directed with nebulizer 12/14/20  Yes Wurst, [place], PA-C  sertraline (ZOLOFT) 25 MG tablet Take 1 tablet (25 mg total) by mouth daily. 09/26/21  Yes Roma Schanz, CNM  budesonide-formoterol (SYMBICORT) 160-4.5 MCG/ACT inhaler Inhale 2 puffs into the lungs in the morning and at bedtime. 08/20/21   Janyth Pupa, DO  loperamide (IMODIUM) 2 MG capsule Take 1 capsule (2 mg total) by  mouth 2 (two) times daily as needed for diarrhea or loose stools. 11/20/21   Jaynee Eagles, PA-C  naproxen (NAPROSYN) 500 MG tablet Take 1 tablet (500 mg total) by mouth 2 (two) times daily with a meal. 11/20/21   Jaynee Eagles, PA-C  ondansetron (ZOFRAN-ODT) 8 MG disintegrating tablet Take 1 tablet (8 mg total) by mouth every 8 (eight) hours as needed for nausea or vomiting. 11/20/21   Jaynee Eagles, PA-C  Prenatal Vit-Fe Fumarate-FA (MULTIVITAMIN-PRENATAL) 27-0.8 MG TABS tablet Take 1 tablet by mouth daily at 12 noon.    [provider]    Family History Family History  Problem Relation Age of Onset   Diabetes Maternal Grandmother    Hypertension Maternal Grandmother    Diabetes Maternal Grandfather     Asthma Maternal Grandfather    Asthma Sister    Breast cancer Maternal Aunt    Colon cancer Neg Hx     Social History Social History   Tobacco Use   Smoking status: Never   Smokeless tobacco: Never  Vaping Use   Vaping Use: Former   Quit date: 07/23/2021  Substance Use Topics   Alcohol use: Not Currently    Comment: occ   Drug use: Not Currently    Types: Marijuana     Allergies   Shrimp [shellfish allergy]   Review of Systems Review of Systems   Physical Exam Triage Vital Signs ED Triage Vitals  Enc Vitals Group     BP 08/12/22 1854 128/77     Pulse Rate 08/12/22 1854 86     Resp 08/12/22 1854 16     Temp 08/12/22 1854 98.1 F (36.7 C)     Temp Source 08/12/22 1854 Oral     SpO2 08/12/22 1854 98 %     Weight --      Height --      Head Circumference --      Peak Flow --      Pain Score 08/12/22 1856 8     Pain Loc --      Pain Edu? --      Excl. in [place]? --    No data found.  Updated Vital Signs BP 128/77 (BP Location: Right Arm)   Pulse 86   Temp 98.1 F (36.7 C) (Oral)   Resp 16   LMP 07/15/2022 (Approximate)   SpO2 98%   Visual Acuity Right Eye Distance:   Left Eye Distance:   Bilateral Distance:    Right Eye Near:   Left Eye Near:    Bilateral Near:     Physical Exam Vitals and nursing note reviewed.  Constitutional:      General: She is not in acute distress.    Appearance: She is well-developed.  HENT:     Head: Normocephalic.     Right Ear: Ear canal and external ear normal. There is impacted cerumen.     Left Ear: Tympanic membrane, ear canal and external ear normal.     Nose: Nose normal.     Mouth/Throat:     Mouth: Mucous membranes are moist.     Pharynx: No oropharyngeal exudate.  Eyes:     Pupils: Pupils are equal, round, and reactive to light.  Cardiovascular:     Rate and Rhythm: Regular rhythm.     Pulses: Normal pulses.     Heart sounds: Normal heart sounds.  Pulmonary:     Effort: Pulmonary effort is  normal. No respiratory distress.     Breath  sounds: Examination of the right-lower field reveals decreased breath sounds. Examination of the left-lower field reveals decreased breath sounds. Decreased breath sounds present. No wheezing, rhonchi or rales.  Abdominal:     General: Bowel sounds are normal.     Palpations: Abdomen is soft.  Musculoskeletal:     Cervical back: Normal range of motion.  Lymphadenopathy:     Cervical: No cervical adenopathy.  Skin:    General: Skin is warm and dry.  Neurological:     General: No focal deficit present.     Mental Status: She is alert and oriented to person, place, and time.  Psychiatric:        Mood and Affect: Mood normal.        Behavior: Behavior normal.      UC Treatments / Results  Labs (all labs ordered are listed, but only abnormal results are displayed) Labs Reviewed - No data to display  EKG   Radiology No results found.  Procedures Procedures (including critical care time)  Medications Ordered in UC Medications  methylPREDNISolone sodium succinate (SOLU-MEDROL) 125 mg/2 mL injection 125 mg (has no administration in time range)  albuterol (PROVENTIL) (2.5 MG/3ML) 0.083% nebulizer solution 2.5 mg (has no administration in time range)    Initial Impression / Assessment and Plan / UC Course  I have reviewed the triage vital signs and the nursing notes.  Pertinent labs & imaging results that were available during my care of the patient were reviewed by me and considered in my medical decision making (see chart for details).     *** Final Clinical Impressions(s) / UC Diagnoses   Final diagnoses:  None   Discharge Instructions   None    ED Prescriptions   None    PDMP not reviewed this encounter.

## 2022-08-12 NOTE — Discharge Instructions (Addendum)
The chest x-ray does not show any signs of pneumonia. Take medication as prescribed. Increase fluids and allow for plenty of rest. Recommend use of a humidifier in your bedroom at nighttime during sleep to help with cough, wheezing, and chest tightness. Also recommend sleeping elevated on pillows while symptoms persist. Please follow-up with your primary care physician for further maintenance of your asthma. Please go to the emergency department if you develop worsening shortness of breath, chest tightness, wheezing, difficulty breathing, or become unable to speak in a complete sentence. Follow-up as needed.

## 2022-08-12 NOTE — ED Triage Notes (Signed)
Pt feeling SOB, chest tightness that started yesterday and ran out of asthma medication and would like a refill on Symbicort and albuterol nebulizer and inhaler.

## 2022-11-09 ENCOUNTER — Ambulatory Visit
Admission: EM | Admit: 2022-11-09 | Discharge: 2022-11-09 | Disposition: A | Discharge status: Home / Self Care | Payer: Medicaid Other

## 2022-11-09 DIAGNOSIS — M542 Cervicalgia: Secondary | ICD-10-CM | POA: Diagnosis not present

## 2022-11-09 MED ORDER — DEXAMETHASONE SODIUM PHOSPHATE 10 MG/ML IJ SOLN
10.0000 mg | INTRAMUSCULAR | Status: AC
  Administered 2022-11-09: 10 mg via INTRAMUSCULAR

## 2022-11-09 MED ORDER — IBUPROFEN 800 MG PO TABS: 800.0000 mg | ORAL_TABLET | Freq: Three times a day (TID) | ORAL | 0 refills | Status: DC

## 2022-11-09 MED ORDER — KETOROLAC TROMETHAMINE 30 MG/ML IJ SOLN
30.0000 mg | Freq: Once | INTRAMUSCULAR | Status: AC
  Administered 2022-11-09: 30 mg via INTRAMUSCULAR

## 2022-11-09 MED ORDER — METHOCARBAMOL 500 MG PO TABS: 500.0000 mg | ORAL_TABLET | Freq: Two times a day (BID) | ORAL | 0 refills | Status: DC

## 2022-11-09 NOTE — ED Triage Notes (Signed)
Pt reports back pain and neck stiffness x 3 days. Tylenol gives no relief.

## 2022-11-09 NOTE — ED Provider Notes (Signed)
[facility]    CSN: [id number] Arrival date & time: 11/09/22  1340      History   Chief Complaint Chief Complaint  Patient presents with   Back Pain    HPI Sharon Nash is a 27 y.o. female.   The history is provided by the patient.   The patient presents for complaints of neck pain that been present for the past several days.  She denies any known injury or trauma, or radiation of pain..  Patient states she has pain when she looks up and down, and when she turns her head from side-to-side.  Patient states that she does have occasional numbness and tingling in her hands and arms, she also endorses neck spasms.  She reports she has been taking ibuprofen for her symptoms with minimal relief.  Past Medical History:  Diagnosis Date   Asthma    Chronic abdominal pain    Chronic knee pain    Respiratory failure, acute ([place]) 06/2012   bipap only, not intubated, due to asthma exacerbation    Patient Active Problem List   Diagnosis Date Noted   Postpartum depression 09/26/2021   Nexplanon insertion 09/26/2021   Preterm delivery 08/22/2021   Abnormal chromosomal and genetic finding on antenatal screening mother 03/28/2021   Abnormal Pap smear of cervix 03/20/2021   Marijuana use 03/14/2021   Encounter for supervision of normal pregnancy, antepartum 03/07/2021   Rh negative state in antepartum period 03/07/2021   Gastroesophageal reflux disease without esophagitis 01/27/2017   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 Authorizing Provider  Multiple Vitamins-Minerals  (MULTIVITAL PO) Take by mouth.   Yes [provider]  acetaminophen (TYLENOL) 500 MG tablet Take 500 mg by mouth every 6 (six) hours as needed.    [provider]  albuterol (PROVENTIL) (2.5 MG/3ML) 0.083% nebulizer solution Take 3 mLs (2.5 mg total) by nebulization every 6 (six) hours as needed for wheezing or shortness of breath. 08/12/22   Libbey Duce-Warren, Alda Lea, NP  albuterol (VENTOLIN HFA) 108 (90 Base) MCG/ACT inhaler Inhale 2 puffs into the lungs every 4 (four) hours as needed for wheezing or shortness of breath. 08/12/22   Salinda Snedeker-Warren, Alda Lea, NP  budesonide-formoterol (SYMBICORT) 160-4.5 MCG/ACT inhaler Inhale 2 puffs into the lungs in the morning and at bedtime. 08/12/22   Jakya Dovidio-Warren, Alda Lea, NP  cetirizine (ZYRTEC) 10 MG tablet Take 10 mg by mouth.    [provider]  fluticasone (FLONASE) 50 MCG/ACT nasal spray Place into both nostrils daily.    [provider]  loperamide (IMODIUM) 2 MG capsule Take 1 capsule (2 mg total) by mouth 2 (two) times daily as needed for diarrhea or loose stools. 11/20/21   Jaynee Eagles, PA-C  naproxen (NAPROSYN) 500 MG tablet Take 1 tablet (500 mg total) by  mouth 2 (two) times daily with a meal. 11/20/21   Jaynee Eagles, PA-C  ondansetron (ZOFRAN-ODT) 8 MG disintegrating tablet Take 1 tablet (8 mg total) by mouth every 8 (eight) hours as needed for nausea or vomiting. 11/20/21   Jaynee Eagles, PA-C  predniSONE (DELTASONE) 50 MG tablet Take 1 tablet daily with breakfast for the next 5 days. 08/12/22   Dahlila Pfahler-Warren, Alda Lea, NP  Prenatal Vit-Fe Fumarate-FA (MULTIVITAMIN-PRENATAL) 27-0.8 MG TABS tablet Take 1 tablet by mouth daily at 12 noon.    [provider]  Respiratory Therapy Supplies (NEBULIZER/TUBING/MOUTHPIECE) KIT Use as directed with nebulizer 12/14/20   Wurst, [place], PA-C  sertraline (ZOLOFT) 25 MG tablet Take 1 tablet (25 mg total) by mouth daily. 09/26/21   Roma Schanz, CNM    Family  History Family History  Problem Relation Age of Onset   Diabetes Maternal Grandmother    Hypertension Maternal Grandmother    Diabetes Maternal Grandfather    Asthma Maternal Grandfather    Asthma Sister    Breast cancer Maternal Aunt    Colon cancer Neg Hx     Social History Social History   Tobacco Use   Smoking status: Never   Smokeless tobacco: Never  Vaping Use   Vaping Use: Former   Quit date: 07/23/2021  Substance Use Topics   Alcohol use: Not Currently    Comment: occ   Drug use: Not Currently    Types: Marijuana     Allergies   Shrimp [shellfish allergy]   Review of Systems Review of Systems Per HPI  Physical Exam Triage Vital Signs ED Triage Vitals  Enc Vitals Group     BP 11/09/22 1422 122/80     Pulse Rate 11/09/22 1422 82     Resp 11/09/22 1422 18     Temp 11/09/22 1422 98.9 F (37.2 C)     Temp Source 11/09/22 1422 Oral     SpO2 11/09/22 1422 98 %     Weight --      Height --      Head Circumference --      Peak Flow --      Pain Score 11/09/22 1428 8     Pain Loc --      Pain Edu? --      Excl. in [place]? --    No data found.  Updated Vital Signs BP 122/80 (BP Location: Right Arm)   Pulse 82   Temp 98.9 F (37.2 C) (Oral)   Resp 18   LMP  (Within Days)   SpO2 98%   Breastfeeding No   Visual Acuity Right Eye Distance:   Left Eye Distance:   Bilateral Distance:    Right Eye Near:   Left Eye Near:    Bilateral Near:     Physical Exam Vitals and nursing note reviewed.  Constitutional:      Appearance: Normal appearance.  HENT:     Head: Normocephalic.  Eyes:     Extraocular Movements: Extraocular movements intact.     Pupils: Pupils are equal, round, and reactive to light.  Cardiovascular:     Rate and Rhythm: Regular rhythm.     Pulses: Normal pulses.     Heart sounds: Normal heart sounds.  Pulmonary:     Effort: Pulmonary effort is normal. No respiratory distress.     Breath sounds: Normal breath sounds. No stridor.  No wheezing, rhonchi or rales.  Musculoskeletal:     Cervical back: Pain with movement, spinous  process tenderness and muscular tenderness present.  Lymphadenopathy:     Cervical: No cervical adenopathy.  Skin:    General: Skin is warm and dry.  Neurological:     General: No focal deficit present.     Mental Status: She is alert and oriented to person, place, and time.  Psychiatric:        Mood and Affect: Mood normal.        Behavior: Behavior normal.      UC Treatments / Results  Labs (all labs ordered are listed, but only abnormal results are displayed) Labs Reviewed - No data to display  EKG   Radiology No results found.  Procedures Procedures (including critical care time)  Medications Ordered in UC Medications - No data to display  Initial Impression / Assessment and Plan / UC Course  I have reviewed the triage vital signs and the nursing notes.  Pertinent labs & imaging results that were available during my care of the patient were reviewed by me and considered in my medical decision making (see chart for details).  The patient is well-appearing, she is in no acute distress, vital signs are stable.  Symptoms consistent with cervical pain.  Patient was given Decadron 10 mg IM and Toradol 30 mg IM in the clinic to help with her neck pain or discomfort.  For her spasm, methocarbamol 500 mg was prescribed, and for her pain, ibuprofen 800 mg was prescribed.  Supportive care recommendations were provided to the patient.  Patient is in agreement with this plan of care and verbalizes understanding.  All questions were answered.  Patient stable for discharge.  Final Clinical Impressions(s) / UC Diagnoses   Final diagnoses:  Neck pain     Discharge Instructions      Take medication as prescribed. Recommend the use of ice or heat as needed.  Apply ice for pain or swelling, heat for spasm or stiffness.  Apply for 20 minutes, remove for 1 hour, then repeat is much as  possible. Gentle stretching and range of motion exercises while symptoms persist. If symptoms fail to improve, or you develop worsening symptoms to include worsening numbness, tingling, or other concerns, please follow-up     ED Prescriptions   None    PDMP not reviewed this encounter.   Tish Men, NP 11/09/22 1512

## 2022-11-09 NOTE — Discharge Instructions (Addendum)
Take medication as prescribed. Recommend the use of ice or heat as needed.  Apply ice for pain or swelling, heat for spasm or stiffness.  Apply for 20 minutes, remove for 1 hour, then repeat is much as possible. Gentle stretching and range of motion exercises while symptoms persist. If symptoms fail to improve, or you develop worsening symptoms to include worsening numbness, tingling, or other concerns, please follow-up

## 2023-02-02 ENCOUNTER — Ambulatory Visit
Admission: EM | Admit: 2023-02-02 | Discharge: 2023-02-02 | Disposition: A | Discharge status: Home / Self Care | Payer: Medicaid Other | Attending: Nurse Practitioner | Admitting: Nurse Practitioner

## 2023-02-02 ENCOUNTER — Encounter: Payer: Self-pay | Admitting: Emergency Medicine

## 2023-02-02 DIAGNOSIS — Z8709 Personal history of other diseases of the respiratory system: Secondary | ICD-10-CM | POA: Diagnosis not present

## 2023-02-02 DIAGNOSIS — M62838 Other muscle spasm: Secondary | ICD-10-CM

## 2023-02-02 DIAGNOSIS — Z760 Encounter for issue of repeat prescription: Secondary | ICD-10-CM

## 2023-02-02 DIAGNOSIS — J4541 Moderate persistent asthma with (acute) exacerbation: Secondary | ICD-10-CM

## 2023-02-02 MED ORDER — PREDNISONE 20 MG PO TABS: 40.0000 mg | ORAL_TABLET | Freq: Every day | ORAL | 0 refills | Status: AC

## 2023-02-02 MED ORDER — FLUTICASONE PROPIONATE 50 MCG/ACT NA SUSP: 2.0000 | Freq: Every day | NASAL | 0 refills | Status: AC

## 2023-02-02 MED ORDER — BUDESONIDE-FORMOTEROL FUMARATE 160-4.5 MCG/ACT IN AERO: 2.0000 | INHALATION_SPRAY | Freq: Two times a day (BID) | RESPIRATORY_TRACT | 0 refills | Status: DC

## 2023-02-02 NOTE — ED Triage Notes (Signed)
Muscle spasm on right side of body x 1 month.  States was given ibuprofen and muscle relaxers that helped the last time she was here.  States she is out of her asthma medication.

## 2023-02-02 NOTE — ED Provider Notes (Signed)
[facility]    CSN: [id number] Arrival date & time: 02/02/23  1146      History   Chief Complaint No chief complaint on file.   HPI Sharon Nash is a 27 y.o. female.   The history is provided by the patient.   The patient presents for complaints of muscle spasms on the right side of her body that been present for the past month, she is also requesting refills for her asthma medication.  Patient denies injury or trauma.  She states that the pain can become so bad that she cannot move her neck, and has to sleep upright.  She denies numbness, tingling, swelling, or weakness in the right lower extremities.  She reports that she was seen for the same or similar symptoms in the past and that she did not find much relief with use of the muscle relaxers.  Reports that she has been taking the ibuprofen for pain.  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   Nexplanon insertion 09/26/2021   Preterm delivery 08/22/2021   Abnormal chromosomal and genetic finding on antenatal screening mother 03/28/2021   Abnormal Pap smear of cervix 03/20/2021   Marijuana use 03/14/2021   Encounter for supervision of normal pregnancy, antepartum 03/07/2021   Rh negative state in antepartum period 03/07/2021   Gastroesophageal reflux disease without esophagitis 01/27/2017   Mild depression 02/20/2016   Asthma exacerbation 07/05/2012    Past Surgical History:  Procedure Laterality Date   NO PAST SURGERIES     None      OB History     Gravida  4   Para  3   Term  2   Preterm  1   AB  1   Living  3      SAB  1   IAB  0   Ectopic  0   Multiple   0   Live Births  3            Home Medications    Prior to Admission medications   Medication Sig Start Date End Date Taking? Authorizing Provider  fluticasone (FLONASE) 50 MCG/ACT nasal spray Place 2 sprays into both nostrils daily. 02/02/23  Yes Davied Nocito-Warren, Sadie Haber, NP  predniSONE (DELTASONE) 20 MG tablet Take 2 tablets (40 mg total) by mouth daily with breakfast for 5 days. 02/02/23 02/07/23 Yes Adante Courington-Warren, Sadie Haber, NP  acetaminophen (TYLENOL) 500 MG tablet Take 500 mg by mouth every 6 (six) hours as needed.    [provider]  albuterol (PROVENTIL) (2.5 MG/3ML) 0.083% nebulizer solution Take 3 mLs (2.5 mg total) by nebulization every 6 (six) hours as needed for wheezing or shortness of breath. 08/12/22   Devanee Pomplun-Warren, Sadie Haber, NP  albuterol (VENTOLIN HFA) 108 (90 Base) MCG/ACT inhaler Inhale 2 puffs into the lungs every 4 (four) hours as needed for wheezing or shortness of breath. 08/12/22   Liston Thum-Warren, Sadie Haber, NP  budesonide-formoterol (SYMBICORT) 160-4.5 MCG/ACT inhaler Inhale 2 puffs into the lungs in the morning and at bedtime. 02/02/23   Kelyn Ponciano-Warren, Sadie Haber, NP  cetirizine (ZYRTEC) 10 MG tablet  Take 10 mg by mouth.    [provider]  ibuprofen (ADVIL) 800 MG tablet Take 1 tablet (800 mg total) by mouth 3 (three) times daily. 11/09/22   Ceairra Mccarver-Warren, Sadie Haber, NP  Multiple Vitamins-Minerals (MULTIVITAL PO) Take by mouth.    [provider]  ondansetron (ZOFRAN-ODT) 8 MG disintegrating tablet Take 1 tablet (8 mg total) by mouth every 8 (eight) hours as needed for nausea or vomiting. 11/20/21   Wallis Bamberg, PA-C  Respiratory Therapy Supplies (NEBULIZER/TUBING/MOUTHPIECE) KIT Use as directed with nebulizer 12/14/20   Wurst, [place], PA-C    Family History Family History  Problem Relation Age of Onset   Diabetes Maternal Grandmother    Hypertension Maternal Grandmother    Diabetes Maternal Grandfather    Asthma Maternal Grandfather     Asthma Sister    Breast cancer Maternal Aunt    Colon cancer Neg Hx     Social History Social History   Tobacco Use   Smoking status: Never   Smokeless tobacco: Never  Vaping Use   Vaping Use: Former   Quit date: 07/23/2021  Substance Use Topics   Alcohol use: Not Currently    Comment: occ   Drug use: Not Currently    Types: Marijuana     Allergies   Shrimp [shellfish allergy]   Review of Systems Review of Systems Per HPI  Physical Exam Triage Vital Signs ED Triage Vitals  Enc Vitals Group     BP 02/02/23 1156 99/64     Pulse Rate 02/02/23 1156 87     Resp 02/02/23 1156 18     Temp 02/02/23 1156 98.4 F (36.9 C)     Temp Source 02/02/23 1156 Oral     SpO2 02/02/23 1156 92 %     Weight --      Height --      Head Circumference --      Peak Flow --      Pain Score 02/02/23 1157 7     Pain Loc --      Pain Edu? --      Excl. in GC? --    No data found.  Updated Vital Signs BP 99/64 (BP Location: Right Arm)   Pulse 87   Temp 98.4 F (36.9 C) (Oral)   Resp 18   LMP 01/09/2023 (Approximate)   SpO2 92%   Visual Acuity Right Eye Distance:   Left Eye Distance:   Bilateral Distance:    Right Eye Near:   Left Eye Near:    Bilateral Near:     Physical Exam Vitals and nursing note reviewed.  Constitutional:      General: She is not in acute distress.    Appearance: Normal appearance.  HENT:     Head: Normocephalic.  Eyes:     Extraocular Movements: Extraocular movements intact.     Pupils: Pupils are equal, round, and reactive to light.  Cardiovascular:     Rate and Rhythm: Normal rate and regular rhythm.     Pulses: Normal pulses.     Heart sounds: Normal heart sounds.  Pulmonary:     Effort: Pulmonary effort is normal. No respiratory distress.     Breath sounds: Normal breath sounds. No stridor. No wheezing, rhonchi or rales.  Abdominal:     General: Bowel sounds are normal.     Palpations: Abdomen is soft.     Tenderness: There is no  abdominal tenderness.  Musculoskeletal:     Right shoulder: Tenderness  present. No swelling or deformity. Decreased range of motion. Normal strength. Normal pulse.     Cervical back: Normal range of motion. Pain with movement and muscular tenderness present.  Skin:    General: Skin is warm and dry.  Neurological:     General: No focal deficit present.     Mental Status: She is alert and oriented to person, place, and time.  Psychiatric:        Mood and Affect: Mood normal.        Behavior: Behavior normal.      UC Treatments / Results  Labs (all labs ordered are listed, but only abnormal results are displayed) Labs Reviewed - No data to display  EKG   Radiology No results found.  Procedures Procedures (including critical care time)  Medications Ordered in UC Medications - No data to display  Initial Impression / Assessment and Plan / UC Course  I have reviewed the triage vital signs and the nursing notes.  Pertinent labs & imaging results that were available during my care of the patient were reviewed by me and considered in my medical decision making (see chart for details).  The patient is well-appearing, she is in no acute distress, vital signs are stable.  Difficult to determine the cause of the patient's muscle spasms.  Suspect inflammation may be contributing to her symptoms.  Will start patient on prednisone 40 mg for the next 5 days.  Patient was advised to take over-the-counter Tylenol, stretching, and use of heat.  Patient was advised that if symptoms are not improving, do recommend that she follow-up with her primary care physician for further evaluation and workup.  Patient Symbicort 160-4.87mcg inhaler was also refilled for underlying asthma.  Patient was also advised that she should establish care with a primary care physician for continued maintenance of her asthma symptoms.  Patient was given information for primary care physician in the area who is excepting  patients.  Patient was in agreement with this plan of care and verbalizes understanding.  All questions were answered.  Patient stable for discharge.  Final Clinical Impressions(s) / UC Diagnoses   Final diagnoses:  Muscle spasm  History of asthma  Medication refill     Discharge Instructions      Take medication as prescribed. Recommend Tylenol arthritis strength 650 mg tablets as needed for muscle pain or discomfort. Continue the use of.  Apply for 20 minutes, remove for 1 hour, then repeat as needed. Make sure you are stretching at least 2-3 times daily to help prevent symptoms. As discussed, it is recommended that you establish care with a primary care physician.  I am providing information for you for a physician in the area who is excepting patients.  Please call as soon as possible to schedule appointment. Follow-up as needed.     ED Prescriptions     Medication Sig Dispense Auth. Provider   budesonide-formoterol (SYMBICORT) 160-4.5 MCG/ACT inhaler Inhale 2 puffs into the lungs in the morning and at bedtime. 1 each Khayden Herzberg-Warren, Sadie Haber, NP   fluticasone (FLONASE) 50 MCG/ACT nasal spray Place 2 sprays into both nostrils daily. 16 g Shellie Rogoff-Warren, Sadie Haber, NP   predniSONE (DELTASONE) 20 MG tablet Take 2 tablets (40 mg total) by mouth daily with breakfast for 5 days. 10 tablet Tatumn Corbridge-Warren, Sadie Haber, NP      PDMP not reviewed this encounter.   Abran Cantor, NP 02/02/23 1442

## 2023-02-02 NOTE — Discharge Instructions (Signed)
Take medication as prescribed. Recommend Tylenol arthritis strength 650 mg tablets as needed for muscle pain or discomfort. Continue the use of.  Apply for 20 minutes, remove for 1 hour, then repeat as needed. Make sure you are stretching at least 2-3 times daily to help prevent symptoms. As discussed, it is recommended that you establish care with a primary care physician.  I am providing information for you for a physician in the area who is excepting patients.  Please call as soon as possible to schedule appointment. Follow-up as needed.

## 2023-05-11 ENCOUNTER — Ambulatory Visit
Admission: EM | Admit: 2023-05-11 | Discharge: 2023-05-11 | Disposition: A | Discharge status: Home / Self Care | Payer: Medicaid Other | Attending: Nurse Practitioner | Admitting: Nurse Practitioner

## 2023-05-11 ENCOUNTER — Ambulatory Visit: Payer: Medicaid Other

## 2023-05-11 DIAGNOSIS — R062 Wheezing: Secondary | ICD-10-CM

## 2023-05-11 DIAGNOSIS — J45901 Unspecified asthma with (acute) exacerbation: Secondary | ICD-10-CM | POA: Diagnosis not present

## 2023-05-11 DIAGNOSIS — H6122 Impacted cerumen, left ear: Secondary | ICD-10-CM

## 2023-05-11 MED ORDER — METHYLPREDNISOLONE SODIUM SUCC 125 MG IJ SOLR
80.0000 mg | Freq: Once | INTRAMUSCULAR | Status: AC
  Administered 2023-05-11: 80 mg via INTRAMUSCULAR

## 2023-05-11 MED ORDER — ALBUTEROL SULFATE (2.5 MG/3ML) 0.083% IN NEBU
2.5000 mg | INHALATION_SOLUTION | Freq: Once | RESPIRATORY_TRACT | Status: AC
  Administered 2023-05-11: 2.5 mg via RESPIRATORY_TRACT

## 2023-05-11 MED ORDER — ALBUTEROL SULFATE (2.5 MG/3ML) 0.083% IN NEBU: 2.5000 mg | INHALATION_SOLUTION | Freq: Four times a day (QID) | RESPIRATORY_TRACT | 0 refills | Status: DC | PRN

## 2023-05-11 MED ORDER — PREDNISONE 20 MG PO TABS: 40.0000 mg | ORAL_TABLET | Freq: Every day | ORAL | 0 refills | Status: AC

## 2023-05-11 MED ORDER — ALBUTEROL SULFATE HFA 108 (90 BASE) MCG/ACT IN AERS: 2.0000 | INHALATION_SPRAY | Freq: Four times a day (QID) | RESPIRATORY_TRACT | 0 refills | Status: DC | PRN

## 2023-05-11 NOTE — Discharge Instructions (Addendum)
The chest x-ray does not show any signs of pneumonia. Take medication as prescribed. Increase fluids and allow for plenty of rest. Recommend use of a humidifier in your bedroom at nighttime during sleep to help with cough, wheezing, and chest tightness. Also recommend sleeping elevated on pillows while symptoms persist. Please follow-up with your primary care physician for further maintenance of your asthma. Please go to the emergency department if you develop worsening shortness of breath, chest tightness, wheezing, difficulty breathing, or become unable to speak in a complete sentence. Follow-up as needed.

## 2023-05-11 NOTE — ED Provider Notes (Signed)
[facility]    CSN: [id number] Arrival date & time: 05/11/23  0850      History   Chief Complaint Chief Complaint  Patient presents with   asthma flare up    HPI Sharon Nash is a 27 y.o. female.   The history is provided by the patient.   The patient presents for complaints of an asthma exacerbation.  Patient states symptoms started approximately 2 days ago when she and her family visited a friend's house who had a rabbit.  Since that time, she complains of nasal congestion, wheezing, shortness of breath, and chest tightness.  Patient denies fever, chills, runny nose, chest pain, abdominal pain, nausea, vomiting, or diarrhea.  Patient reports that she has ran out of her asthma medications.  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   Nexplanon insertion 09/26/2021   Preterm delivery 08/22/2021   Abnormal chromosomal and genetic finding on antenatal screening mother 03/28/2021   Abnormal Pap smear of cervix 03/20/2021   Marijuana use 03/14/2021   Encounter for supervision of normal pregnancy, antepartum 03/07/2021   Rh negative state in antepartum period 03/07/2021   Gastroesophageal reflux disease without esophagitis 01/27/2017   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 Authorizing Provider  albuterol (PROVENTIL) (2.5  MG/3ML) 0.083% nebulizer solution Take 3 mLs (2.5 mg total) by nebulization every 6 (six) hours as needed for wheezing or shortness of breath. 05/11/23  Yes Cambell Rickenbach-Warren, Sadie Haber, NP  albuterol (VENTOLIN HFA) 108 (90 Base) MCG/ACT inhaler Inhale 2 puffs into the lungs every 6 (six) hours as needed. 05/11/23  Yes Arizbeth Cawthorn-Warren, Sadie Haber, NP  predniSONE (DELTASONE) 20 MG tablet Take 2 tablets (40 mg total) by mouth daily with breakfast for 5 days. 05/11/23 05/16/23 Yes Kahleb Mcclane-Warren, Sadie Haber, NP  acetaminophen (TYLENOL) 500 MG tablet Take 500 mg by mouth every 6 (six) hours as needed.    [provider]  budesonide-formoterol (SYMBICORT) 160-4.5 MCG/ACT inhaler Inhale 2 puffs into the lungs in the morning and at bedtime. 02/02/23   Lucetta Baehr-Warren, Sadie Haber, NP  cetirizine (ZYRTEC) 10 MG tablet Take 10 mg by mouth.    [provider]  fluticasone (FLONASE) 50 MCG/ACT nasal spray Place 2 sprays into both nostrils daily. 02/02/23   Nagi Furio-Warren, Sadie Haber, NP  ibuprofen (ADVIL) 800 MG tablet Take 1 tablet (800 mg total) by mouth 3 (three) times daily. 11/09/22   Neeya Prigmore-Warren, Sadie Haber, NP  Multiple Vitamins-Minerals (MULTIVITAL PO) Take by mouth.    [provider]  ondansetron (ZOFRAN-ODT)  8 MG disintegrating tablet Take 1 tablet (8 mg total) by mouth every 8 (eight) hours as needed for nausea or vomiting. 11/20/21   Wallis Bamberg, PA-C  Respiratory Therapy Supplies (NEBULIZER/TUBING/MOUTHPIECE) KIT Use as directed with nebulizer 12/14/20   Wurst, [place], PA-C    Family History Family History  Problem Relation Age of Onset   Diabetes Maternal Grandmother    Hypertension Maternal Grandmother    Diabetes Maternal Grandfather    Asthma Maternal Grandfather    Asthma Sister    Breast cancer Maternal Aunt    Colon cancer Neg Hx     Social History Social History   Tobacco Use   Smoking status: Never   Smokeless tobacco: Never  Vaping Use   Vaping status: Former   Quit  date: 07/23/2021  Substance Use Topics   Alcohol use: Not Currently    Comment: occ   Drug use: Not Currently    Types: Marijuana     Allergies   Shrimp [shellfish allergy]   Review of Systems Review of Systems Per HPI  Physical Exam Triage Vital Signs ED Triage Vitals [05/11/23 0859]  Encounter Vitals Group     BP      Systolic BP Percentile      Diastolic BP Percentile      Pulse      Resp      Temp      Temp src      SpO2      Weight      Height      Head Circumference      Peak Flow      Pain Score 0     Pain Loc      Pain Education      Exclude from Growth Chart    No data found.  Updated Vital Signs LMP 04/10/2023   Visual Acuity Right Eye Distance:   Left Eye Distance:   Bilateral Distance:    Right Eye Near:   Left Eye Near:    Bilateral Near:     Physical Exam Vitals and nursing note reviewed.  Constitutional:      General: She is not in acute distress.    Appearance: Normal appearance.  HENT:     Head: Normocephalic.     Right Ear: Tympanic membrane, ear canal and external ear normal.     Left Ear: Ear canal and external ear normal. There is impacted cerumen.     Nose: Congestion present.     Right Turbinates: Enlarged and swollen.     Left Turbinates: Enlarged and swollen.     Right Sinus: No maxillary sinus tenderness or frontal sinus tenderness.     Left Sinus: No maxillary sinus tenderness or frontal sinus tenderness.     Mouth/Throat:     Lips: Pink.     Mouth: Mucous membranes are moist.     Pharynx: Oropharynx is clear. Uvula midline. Posterior oropharyngeal erythema and postnasal drip present. No pharyngeal swelling.  Eyes:     Extraocular Movements: Extraocular movements intact.     Conjunctiva/sclera: Conjunctivae normal.     Pupils: Pupils are equal, round, and reactive to light.  Cardiovascular:     Rate and Rhythm: Normal rate and regular rhythm.     Pulses: Normal pulses.     Heart sounds: Normal heart sounds.   Pulmonary:     Effort: Pulmonary effort is normal. No respiratory distress.     Breath sounds: No stridor. Wheezing present. No rhonchi or  rales.     Comments: Post nebulizer treatment, lung sounds with increased air movement, no wheezing noted.  Patient reports improvement of her breathing. Abdominal:     General: Bowel sounds are normal.     Palpations: Abdomen is soft.     Tenderness: There is no abdominal tenderness.  Musculoskeletal:     Cervical back: Normal range of motion.  Lymphadenopathy:     Cervical: No cervical adenopathy.  Skin:    General: Skin is warm and dry.  Neurological:     General: No focal deficit present.     Mental Status: She is alert and oriented to person, place, and time.  Psychiatric:        Mood and Affect: Mood normal.        Behavior: Behavior normal.      UC Treatments / Results  Labs (all labs ordered are listed, but only abnormal results are displayed) Labs Reviewed - No data to display  EKG   Radiology DG Chest 2 View  Result Date: 05/11/2023 CLINICAL DATA:  Wheezing, shortness of breath, and chest tightness for 2 days. History of asthma. EXAM: CHEST - 2 VIEW COMPARISON:  Chest radiographs 08/12/2022 and 07/17/2017 FINDINGS: Cardiac silhouette and mediastinal contours are within normal limits. The lungs are clear. No pleural effusion or pneumothorax. No significant skeletal abnormality. IMPRESSION: No active cardiopulmonary disease. Electronically Signed   By: Neita Garnet M.D.   On: 05/11/2023 09:27    Procedures Procedures (including critical care time)  Medications Ordered in UC Medications  albuterol (PROVENTIL) (2.5 MG/3ML) 0.083% nebulizer solution 2.5 mg (2.5 mg Nebulization Given 05/11/23 0924)  methylPREDNISolone sodium succinate (SOLU-MEDROL) 125 mg/2 mL injection 80 mg (80 mg Intramuscular Given 05/11/23 0923)    Initial Impression / Assessment and Plan / UC Course  I have reviewed the triage vital signs and the nursing  notes.  Pertinent labs & imaging results that were available during my care of the patient were reviewed by me and considered in my medical decision making (see chart for details).  The patient is well-appearing, she is in no acute distress, vital signs are stable.  Patient presents for acute asthma exacerbation.  Patient was administered Solu-Medrol 80 mg IM and an albuterol nebulizer treatment.  Chest x-ray was negative for pneumonia.  Patient reports improvement in her breathing after the nebulizer treatment.  Irrigation of the left ear was performed with complete evacuation of cerumen.  Prednisone 40 mg prescribed for the next 5 days for asthma exacerbation, and albuterol inhaler, and albuterol nebulizer solution.  Patient was given strict ER follow-up precautions.  Patient was also advised to follow-up with her primary care or pulmonologist for further care and maintenance of her asthma.  Patient is in agreement with this plan of care and verbalizes understanding.  All questions were answered.  Patient stable for discharge.  Final Clinical Impressions(s) / UC Diagnoses   Final diagnoses:  Moderate asthma with acute exacerbation, unspecified whether persistent  Impacted cerumen, left ear     Discharge Instructions      The chest x-ray does not show any signs of pneumonia. Take medication as prescribed. Increase fluids and allow for plenty of rest. Recommend use of a humidifier in your bedroom at nighttime during sleep to help with cough, wheezing, and chest tightness. Also recommend sleeping elevated on pillows while symptoms persist. Please follow-up with your primary care physician for further maintenance of your asthma. Please go to the emergency department if you develop worsening  shortness of breath, chest tightness, wheezing, difficulty breathing, or become unable to speak in a complete sentence. Follow-up as needed.     ED Prescriptions     Medication Sig Dispense Auth.  Provider   predniSONE (DELTASONE) 20 MG tablet Take 2 tablets (40 mg total) by mouth daily with breakfast for 5 days. 10 tablet Criss Pallone-Warren, Sadie Haber, NP   albuterol (VENTOLIN HFA) 108 (90 Base) MCG/ACT inhaler Inhale 2 puffs into the lungs every 6 (six) hours as needed. 8 g Sagrario Lineberry-Warren, Sadie Haber, NP   albuterol (PROVENTIL) (2.5 MG/3ML) 0.083% nebulizer solution Take 3 mLs (2.5 mg total) by nebulization every 6 (six) hours as needed for wheezing or shortness of breath. 75 mL Solae Norling-Warren, Sadie Haber, NP      PDMP not reviewed this encounter.   Abran Cantor, NP 05/11/23 [phone]

## 2023-05-11 NOTE — ED Triage Notes (Signed)
Pt reports she is having an asthma flare up x 2 days

## 2023-10-09 ENCOUNTER — Ambulatory Visit: Payer: Self-pay | Admitting: Adult Health

## 2023-10-09 ENCOUNTER — Encounter: Payer: Self-pay | Admitting: Adult Health

## 2023-10-09 VITALS — BP 126/85 | HR 76 | Ht 68.0 in | Wt 241.5 lb

## 2023-10-09 DIAGNOSIS — Z1331 Encounter for screening for depression: Secondary | ICD-10-CM

## 2023-10-09 DIAGNOSIS — F32A Depression, unspecified: Secondary | ICD-10-CM | POA: Diagnosis not present

## 2023-10-09 DIAGNOSIS — K439 Ventral hernia without obstruction or gangrene: Secondary | ICD-10-CM | POA: Diagnosis not present

## 2023-10-09 DIAGNOSIS — F419 Anxiety disorder, unspecified: Secondary | ICD-10-CM | POA: Insufficient documentation

## 2023-10-09 MED ORDER — ESCITALOPRAM OXALATE 10 MG PO TABS
10.0000 mg | ORAL_TABLET | Freq: Every day | ORAL | 3 refills | Status: DC
Start: 2023-10-09 — End: 2024-07-14

## 2023-10-09 NOTE — Progress Notes (Signed)
Subjective:     Patient ID: Sharon Nash, female   DOB: 02-01-1996, 28 y.o.   MRN: [id number]  HPI Sharon Nash is a 28 year old black female,single, [phone] in complaining of knot in upper abdomen, has gotten bigger and more painful in last 3-4 months, first noticed about 2 years ago after baby born. She also has anxiety and depression, she was taking zoloft and stopped about 6 months ago for vivid bad dreams.     Component Value Date/Time   DIAGPAP (A) 03/14/2021 1100    - Atypical squamous cells of undetermined significance (ASC-US)   DIAGPAP  12/31/2017 0000    NEGATIVE FOR INTRAEPITHELIAL LESIONS OR MALIGNANCY.   HPVHIGH Negative 03/14/2021 1100   ADEQPAP Satisfactory for evaluation.  The absence of an 03/14/2021 1100   ADEQPAP  03/14/2021 1100    endocervical/transformation zone component is not uncommon in pregnant   ADEQPAP patients. 03/14/2021 1100    Review of Systems +knot in upper abdomen, has gotten bigger and more painful in last 3-4 months, first noticed about 2 years ago after baby born.  + anxiety and depression, she was taking zoloft and stopped about 6 months ago for vivid bad dreams.   Reviewed past medical,surgical, social and family history. Reviewed medications and allergies.  Objective:   Physical Exam BP 126/85 (BP Location: Left Arm, Patient Position: Sitting, Cuff Size: Large)   Pulse 76   Ht 5\' 8"  (1.727 m)   Wt 241 lb 8 oz (109.5 kg)   LMP 10/06/2023 (Exact Date)   BMI 36.72 kg/m     Skin warm and dry.  Lungs: clear to ausculation bilaterally. Cardiovascular: regular rate and rhythm.  Abdomen is soft, and has tenderness upper abdomen, has questionable hernia, can feel when coughs and is tender, do not think it is rectus muscle separation.     10/09/2023    2:08 PM 03/14/2021   11:00 AM 12/31/2017    1:27 PM  Depression screen PHQ 2/9  Decreased Interest 2 2 0  Down, Depressed, Hopeless 3 0 1  PHQ - 2 Score 5 2 1   Altered sleeping 1 2   Tired,  decreased energy 3 1   Change in appetite 3 2   Feeling bad or failure about yourself  2 0   Trouble concentrating 0 0   Moving slowly or fidgety/restless 1 0   Suicidal thoughts 0 0   PHQ-9 Score 15 7        10/09/2023    2:10 PM 03/14/2021   11:01 AM  GAD 7 : Generalized Anxiety Score  Nervous, Anxious, on Edge 3 2  Control/stop worrying 2 1  Worry too much - different things 2 1  Trouble relaxing 3 1  Restless 3 1  Easily annoyed or irritable 3 1  Afraid - awful might happen 0 0  Total GAD 7 Score 16 7      Upstream - 10/09/23 1418       Pregnancy Intention Screening   Does the patient want to become pregnant in the next year? No    Does the patient's partner want to become pregnant in the next year? No    Would the patient like to discuss contraceptive options today? No      Contraception Wrap Up   Current Method Hormonal Implant    End Method Hormonal Implant    Contraception Counseling Provided Yes             Assessment:  1. Anxiety and depression (Primary) Denies any SI, or HI Will try lexapro 10 mg 1 daily Meds ordered this encounter  Medications   escitalopram (LEXAPRO) 10 MG tablet    Sig: Take 1 tablet (10 mg total) by mouth daily.    Dispense:  30 tablet    Refill:  3    Supervising Provider:   Lazaro Nash [[id number]]    If has any dreams stop and call me Declines BH referral at this time  Do not take advil with lexapro, has increased bleeding risk  2. Ventral hernia without obstruction or gangrene +knot in upper abdomen, has gotten bigger and more painful in last 3-4 months, first noticed about 2 years ago after baby born. It is tender and can feel when coughs, do not think it is rectus muscle separation Will refer to RS for further evaluation  - Ambulatory referral to General Surgery     Plan:     Follow up in 8 weeks for ROS

## 2023-10-11 ENCOUNTER — Other Ambulatory Visit: Payer: Self-pay

## 2023-10-11 ENCOUNTER — Emergency Department (HOSPITAL_COMMUNITY): Payer: No Typology Code available for payment source

## 2023-10-11 ENCOUNTER — Emergency Department (HOSPITAL_COMMUNITY)
Admission: EM | Admit: 2023-10-11 | Discharge: 2023-10-11 | Disposition: A | Discharge status: Home / Self Care | Payer: No Typology Code available for payment source | Attending: Emergency Medicine | Admitting: Emergency Medicine

## 2023-10-11 ENCOUNTER — Encounter (HOSPITAL_COMMUNITY): Payer: Self-pay | Admitting: *Deleted

## 2023-10-11 DIAGNOSIS — J111 Influenza due to unidentified influenza virus with other respiratory manifestations: Secondary | ICD-10-CM | POA: Insufficient documentation

## 2023-10-11 DIAGNOSIS — R059 Cough, unspecified: Secondary | ICD-10-CM | POA: Diagnosis present

## 2023-10-11 DIAGNOSIS — R1033 Periumbilical pain: Secondary | ICD-10-CM | POA: Insufficient documentation

## 2023-10-11 LAB — LIPASE, BLOOD: Lipase: 24 U/L (ref 11–51)

## 2023-10-11 LAB — HCG, QUANTITATIVE, PREGNANCY: hCG, Beta Chain, Quant, S: <1 m[IU]/mL (ref <5)

## 2023-10-11 LAB — COMPREHENSIVE METABOLIC PANEL
ALT: 11 U/L (ref 0–44)
AST: 13 U/L — ABNORMAL LOW (ref 15–41)
Albumin: 3.9 g/dL (ref 3.5–5.0)
Alkaline Phosphatase: 56 U/L (ref 38–126)
Anion gap: 13 (ref 5–15)
BUN: 15 mg/dL (ref 6–20)
CO2: 19 mmol/L — ABNORMAL LOW (ref 22–32)
Calcium: 9.3 mg/dL (ref 8.9–10.3)
Chloride: 104 mmol/L (ref 98–111)
Creatinine, Ser: 0.71 mg/dL (ref 0.44–1.00)
GFR, Estimated: >60 mL/min (ref >60)
Glucose, Bld: 101 mg/dL — ABNORMAL HIGH (ref 70–99)
Potassium: 3.6 mmol/L (ref 3.5–5.1)
Sodium: 136 mmol/L (ref 135–145)
Total Bilirubin: 0.7 mg/dL (ref 0.0–1.2)
Total Protein: 8.2 g/dL — ABNORMAL HIGH (ref 6.5–8.1)

## 2023-10-11 LAB — CBC
HCT: 36.9 % (ref 36.0–46.0)
Hemoglobin: 11.8 g/dL — ABNORMAL LOW (ref 12.0–15.0)
MCH: 28.4 pg (ref 26.0–34.0)
MCHC: 32 g/dL (ref 30.0–36.0)
MCV: 88.7 fL (ref 80.0–100.0)
Platelets: 350 10*3/uL (ref 150–400)
RBC: 4.16 MIL/uL (ref 3.87–5.11)
RDW: 13.6 % (ref 11.5–15.5)
WBC: 7.3 10*3/uL (ref 4.0–10.5)
nRBC: 0 % (ref 0.0–0.2)

## 2023-10-11 LAB — URINALYSIS, ROUTINE W REFLEX MICROSCOPIC
Bacteria, UA: NONE SEEN
Bilirubin Urine: NEGATIVE
Glucose, UA: NEGATIVE mg/dL
Ketones, ur: 20 mg/dL — AB
Leukocytes,Ua: NEGATIVE
Nitrite: NEGATIVE
Protein, ur: 30 mg/dL — AB
Specific Gravity, Urine: 1.02 (ref 1.005–1.030)
pH: 6 (ref 5.0–8.0)

## 2023-10-11 MED ORDER — KETOROLAC TROMETHAMINE 15 MG/ML IJ SOLN
15.0000 mg | Freq: Once | INTRAMUSCULAR | Status: AC
  Administered 2023-10-11: 15 mg via INTRAVENOUS
  Filled 2023-10-11: qty 1

## 2023-10-11 MED ORDER — KETOROLAC TROMETHAMINE 30 MG/ML IJ SOLN
30.0000 mg | Freq: Once | INTRAMUSCULAR | Status: AC
  Administered 2023-10-11: 30 mg via INTRAVENOUS
  Filled 2023-10-11: qty 1

## 2023-10-11 MED ORDER — BENZONATATE 100 MG PO CAPS: 100.0000 mg | ORAL_CAPSULE | Freq: Three times a day (TID) | ORAL | 0 refills | Status: DC

## 2023-10-11 MED ORDER — IOHEXOL 300 MG/ML  SOLN
100.0000 mL | Freq: Once | INTRAMUSCULAR | Status: AC | PRN
  Administered 2023-10-11: 100 mL via INTRAVENOUS

## 2023-10-11 MED ORDER — ONDANSETRON 4 MG PO TBDP: 4.0000 mg | ORAL_TABLET | Freq: Three times a day (TID) | ORAL | 0 refills | Status: DC | PRN

## 2023-10-11 NOTE — ED Notes (Signed)
Pt escorted to [facility]. Transport waiting to take pt to CT

## 2023-10-11 NOTE — ED Provider Notes (Signed)
[place] EMERGENCY DEPARTMENT AT [facility] Provider Note   CSN: [id number] Arrival date & time: 10/11/23  1214     History  Chief Complaint  Patient presents with   Abdominal Pain    Sharon Nash is a 28 y.o. female.   Abdominal Pain  This patient is a 28 year old female, she was told by her gynecologist recently that she had a hernia in the supraumbilical region because she has been having pain for months there.  She reports that she still has pain, the pain seem to be worse today, she did have an episode of vomiting she also noticed that she is having coughing fevers chills and a sore throat.  Both of her kids have similar upper respiratory symptoms.  She is passing gas and in fact she is having diarrhea    Home Medications Prior to Admission medications   Medication Sig Start Date End Date Taking? Authorizing Provider  benzonatate (TESSALON) 100 MG capsule Take 1 capsule (100 mg total) by mouth every 8 (eight) hours. 10/11/23  Yes Eber Hong, MD  ondansetron (ZOFRAN-ODT) 4 MG disintegrating tablet Take 1 tablet (4 mg total) by mouth every 8 (eight) hours as needed for nausea. 10/11/23  Yes Eber Hong, MD  acetaminophen (TYLENOL) 500 MG tablet Take 500 mg by mouth every 6 (six) hours as needed.    [provider]  albuterol (PROVENTIL) (2.5 MG/3ML) 0.083% nebulizer solution Take 3 mLs (2.5 mg total) by nebulization every 6 (six) hours as needed for wheezing or shortness of breath. 05/11/23   Leath-Warren, Sadie Haber, NP  albuterol (VENTOLIN HFA) 108 (90 Base) MCG/ACT inhaler Inhale 2 puffs into the lungs every 6 (six) hours as needed. 05/11/23   Leath-Warren, Sadie Haber, NP  budesonide-formoterol (SYMBICORT) 160-4.5 MCG/ACT inhaler Inhale 2 puffs into the lungs in the morning and at bedtime. 02/02/23   Leath-Warren, Sadie Haber, NP  cetirizine (ZYRTEC) 10 MG tablet Take 10 mg by mouth.    [provider]  escitalopram (LEXAPRO) 10 MG tablet Take 1  tablet (10 mg total) by mouth daily. 10/09/23   Adline Potter, NP  etonogestrel (NEXPLANON) 68 MG IMPL implant 1 each by Subdermal route once.    [provider]  fluticasone (FLONASE) 50 MCG/ACT nasal spray Place 2 sprays into both nostrils daily. 02/02/23   Leath-Warren, Sadie Haber, NP  Respiratory Therapy Supplies (NEBULIZER/TUBING/MOUTHPIECE) KIT Use as directed with nebulizer 12/14/20   Wurst, [place], PA-C      Allergies    Shrimp [shellfish allergy] and Sertraline    Review of Systems   Review of Systems  Gastrointestinal:  Positive for abdominal pain.  All other systems reviewed and are negative.   Physical Exam Updated Vital Signs BP 108/62   Pulse 95   Temp (!) 100.6 F (38.1 C) (Oral)   Resp 16   Ht 1.727 m (5\' 8" )   Wt 108.9 kg   LMP 10/06/2023 (Exact Date)   SpO2 92%   BMI 36.49 kg/m  Physical Exam Vitals and nursing note reviewed.  Constitutional:      General: She is not in acute distress.    Appearance: She is well-developed.  HENT:     Head: Normocephalic and atraumatic.     Mouth/Throat:     Pharynx: No oropharyngeal exudate.  Eyes:     General: No scleral icterus.       Right eye: No discharge.        Left eye: No discharge.  Conjunctiva/sclera: Conjunctivae normal.     Pupils: Pupils are equal, round, and reactive to light.  Neck:     Thyroid: No thyromegaly.     Vascular: No JVD.  Cardiovascular:     Rate and Rhythm: Normal rate and regular rhythm.     Heart sounds: Normal heart sounds. No murmur heard.    No friction rub. No gallop.  Pulmonary:     Effort: Pulmonary effort is normal. No respiratory distress.     Breath sounds: Normal breath sounds. No wheezing or rales.  Abdominal:     General: Bowel sounds are normal. There is no distension.     Palpations: Abdomen is soft. There is no mass.     Tenderness: There is abdominal tenderness.     Comments: Mild abdominal tenderness in the midepigastrium to the supraumbilical  region.  There is no other abdominal tenderness, no masses, no peritoneal signs, no guarding  Musculoskeletal:        General: No tenderness. Normal range of motion.     Cervical back: Normal range of motion and neck supple.  Lymphadenopathy:     Cervical: No cervical adenopathy.  Skin:    General: Skin is warm and dry.     Findings: No erythema or rash.  Neurological:     Mental Status: She is alert.     Coordination: Coordination normal.  Psychiatric:        Behavior: Behavior normal.     ED Results / Procedures / Treatments   Labs (all labs ordered are listed, but only abnormal results are displayed) Labs Reviewed  CBC - Abnormal; Notable for the following components:      Result Value   Hemoglobin 11.8 (*)    All other components within normal limits  COMPREHENSIVE METABOLIC PANEL - Abnormal; Notable for the following components:   CO2 19 (*)    Glucose, Bld 101 (*)    Total Protein 8.2 (*)    AST 13 (*)    All other components within normal limits  URINALYSIS, ROUTINE W REFLEX MICROSCOPIC - Abnormal; Notable for the following components:   APPearance CLOUDY (*)    Hgb urine dipstick MODERATE (*)    Ketones, ur 20 (*)    Protein, ur 30 (*)    All other components within normal limits  LIPASE, BLOOD  HCG, QUANTITATIVE, PREGNANCY    EKG None  Radiology CT ABDOMEN PELVIS W CONTRAST Result Date: 10/11/2023 CLINICAL DATA:  Abdominal pain, acute, nonlocalized EXAM: CT ABDOMEN AND PELVIS WITH CONTRAST TECHNIQUE: Multidetector CT imaging of the abdomen and pelvis was performed using the standard protocol following bolus administration of intravenous contrast. RADIATION DOSE REDUCTION: This exam was performed according to the departmental dose-optimization program which includes automated exposure control, adjustment of the mA and/or kV according to patient size and/or use of iterative reconstruction technique. CONTRAST:  OMNIPAQUE IOHEXOL 300 MG/ML  SOLN COMPARISON:   06/15/2017 FINDINGS: Lower chest: Included lung bases are clear.  Heart size is normal. Hepatobiliary: No focal liver abnormality is seen. No gallstones, gallbladder wall thickening, or biliary dilatation. Pancreas: Unremarkable. No pancreatic ductal dilatation or surrounding inflammatory changes. Spleen: Normal in size without focal abnormality. Adrenals/Urinary Tract: Unremarkable adrenal glands. Kidneys enhance symmetrically without focal lesion, stone, or hydronephrosis. Ureters are nondilated. Urinary bladder wall is thickened, likely related to under-distension. Stomach/Bowel: Tiny hiatal hernia. Stomach is within normal limits. Appendix appears normal (series 2, image 59). No evidence of bowel wall thickening, distention, or inflammatory changes. Vascular/Lymphatic:  No significant vascular findings are present. No enlarged abdominal or pelvic lymph nodes. Reproductive: Uterus and bilateral adnexa are unremarkable. Other: No free fluid. No abdominopelvic fluid collection. No pneumoperitoneum. Tiny fat containing periumbilical hernia with subtle fat stranding. Musculoskeletal: No acute or significant osseous findings. IMPRESSION: 1. Tiny fat containing periumbilical hernia with subtle fat stranding. 2. Otherwise, no acute abdominopelvic findings. 3. Urinary bladder wall is thickened, likely related to under-distension. Correlate with urinalysis. 4. Tiny hiatal hernia. Electronically Signed   By: Duanne Guess D.O.   On: 10/11/2023 16:46    Procedures Procedures    Medications Ordered in ED Medications  ketorolac (TORADOL) 30 MG/ML injection 30 mg (30 mg Intravenous Given 10/11/23 1519)  iohexol (OMNIPAQUE) 300 MG/ML solution 100 mL (100 mLs Intravenous Contrast Given 10/11/23 1625)    ED Course/ Medical Decision Making/ A&P                                 Medical Decision Making Amount and/or Complexity of Data Reviewed Radiology: ordered.  Risk Prescription drug management.    This  patient presents to the ED for concern of abdominal pain but also has fever and respiratory symptoms differential diagnosis includes influenza-like illness, when I have the patient do a sit up I am not able to elicit any reproducible masses, she does have a slight rectus diastases, I suspect that she has an influenza-like illness like her to children    Additional history obtained:  Additional history obtained from med record External records from outside source obtained and reviewed including multiple ED visits, admitted for pregnancy related issues in 2022, no recent admissions   Lab Tests:  I Ordered, and personally interpreted labs.  The pertinent results include: No leukocytosis   Imaging Studies ordered:  I ordered imaging studies including CT scan of the abdomen and pelvis I independently visualized and interpreted imaging which showed CT scan of the abdomen and pelvis shows no signs of intra-abdominal abnormalities, there is a fat-containing hernia I agree with the radiologist interpretation   Medicines ordered and prescription drug management:  I ordered medication including Zofran and Tessalon for home for influenza-like symptoms I have reviewed the patients home medicines and have made adjustments as needed   Problem List / ED Course:  Influenza, abdominal pain   Social Determinants of Health:   None          Final Clinical Impression(s) / ED Diagnoses Final diagnoses:  Influenza  Periumbilical abdominal pain    Rx / DC Orders ED Discharge Orders          Ordered    ondansetron (ZOFRAN-ODT) 4 MG disintegrating tablet  Every 8 hours PRN        10/11/23 1753    benzonatate (TESSALON) 100 MG capsule  Every 8 hours        10/11/23 1753              Eber Hong, MD 10/11/23 1754

## 2023-10-11 NOTE — ED Triage Notes (Signed)
Pt BIB [facility] for hernia pain.  Toradol 30mg  given en route per report, IV 20G to left hand. Pt states med did not help with pain.

## 2023-10-11 NOTE — Discharge Instructions (Addendum)
The CT scan shows no signs of hernia, there is a small amount of a fat hernia but this is not a surgical problem.  You do have the flu like your kids, you will be contagious for a week, Tessalon for cough, Zofran for nausea, drink plenty of clear liquids

## 2023-10-23 ENCOUNTER — Ambulatory Visit: Payer: No Typology Code available for payment source | Admitting: Surgery

## 2023-12-04 ENCOUNTER — Ambulatory Visit: Payer: No Typology Code available for payment source | Admitting: Adult Health

## 2024-01-18 ENCOUNTER — Ambulatory Visit
Admission: EM | Admit: 2024-01-18 | Discharge: 2024-01-18 | Disposition: A | Discharge status: Home / Self Care | Attending: Nurse Practitioner | Admitting: Nurse Practitioner

## 2024-01-18 DIAGNOSIS — M542 Cervicalgia: Secondary | ICD-10-CM

## 2024-01-18 DIAGNOSIS — M436 Torticollis: Secondary | ICD-10-CM

## 2024-01-18 DIAGNOSIS — Z8739 Personal history of other diseases of the musculoskeletal system and connective tissue: Secondary | ICD-10-CM

## 2024-01-18 MED ORDER — KETOROLAC TROMETHAMINE 30 MG/ML IJ SOLN
30.0000 mg | Freq: Once | INTRAMUSCULAR | Status: AC
  Administered 2024-01-18: 30 mg via INTRAMUSCULAR

## 2024-01-18 MED ORDER — DEXAMETHASONE SODIUM PHOSPHATE 10 MG/ML IJ SOLN
10.0000 mg | INTRAMUSCULAR | Status: AC
  Administered 2024-01-18: 10 mg via INTRAMUSCULAR

## 2024-01-18 MED ORDER — PREDNISONE 20 MG PO TABS: 40.0000 mg | ORAL_TABLET | Freq: Every day | ORAL | 0 refills | Status: AC

## 2024-01-18 MED ORDER — METHOCARBAMOL 500 MG PO TABS: 500.0000 mg | ORAL_TABLET | Freq: Two times a day (BID) | ORAL | 0 refills | Status: DC

## 2024-01-18 NOTE — Discharge Instructions (Signed)
 You were given an injection of Toradol  30 mg and Decadron  10 mg today.  Do not take any additional NSAIDs such as Aleve , ibuprofen , Advil , or naproxen .  You may take Tylenol  for breakthrough pain or discomfort. Take medication as prescribed.  As discussed, do not take ibuprofen  while you are taking the prednisone .  You may begin ibuprofen  once you complete the prednisone . Recommend the use of ice or heat as needed.  Apply ice for pain or swelling, heat for spasm or stiffness.  Apply for 20 minutes, remove for 1 hour, then repeat is much as possible. Gentle stretching and range of motion exercises while symptoms persist. If symptoms fail to improve with this treatment, you may follow-up in this clinic or with your primary care physician for further evaluation.  As discussed, consider follow-up with orthopedics for continued or reoccurring neck pain. Follow-up as needed.

## 2024-01-18 NOTE — ED Triage Notes (Signed)
 Pt reports she has "crick" in her neck x 1 day

## 2024-01-18 NOTE — ED Provider Notes (Addendum)
 [facility]    CSN: [id number] Arrival date & time: 01/18/24  4098      History   Chief Complaint No chief complaint on file.   HPI Sharon Nash is a 28 y.o. female.   The history is provided by the patient.   Patient presents for complaints of right-sided neck pain that started over the past 24 hours.  Patient states when she woke up this morning, she had pain and stiffness in the right side of her neck that radiated to the mid neck.  Patient states she has difficulty moving her neck from side-to-side and looking up and down.  She denies numbness, tingling, or radiation of pain.  Patient reports prior history of same.  Patient states that she did sleep on 2 pillows last evening.  Patient states that she tried ice and took Tylenol  with minimal relief.  Past Medical History:  Diagnosis Date  . Asthma   . Chronic abdominal pain   . Chronic knee pain   . Respiratory failure, acute (HCC) 06/2012   bipap only, not intubated, due to asthma exacerbation    Patient Active Problem List   Diagnosis Date Noted  . Anxiety and depression 10/09/2023  . Ventral hernia without obstruction or gangrene 10/09/2023  . Postpartum depression 09/26/2021  . Nexplanon  insertion 09/26/2021  . Preterm delivery 08/22/2021  . Abnormal chromosomal and genetic finding on antenatal screening mother 03/28/2021  . Abnormal Pap smear of cervix 03/20/2021  . Marijuana use 03/14/2021  . Encounter for supervision of normal pregnancy, antepartum 03/07/2021  . Rh negative state in antepartum period 03/07/2021  . Gastroesophageal reflux disease without esophagitis 01/27/2017  . 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 Asthma exacerbation 07/05/2012    Past Surgical History:  Procedure Laterality Date  . NO PAST SURGERIES    . None      OB History     Gravida  4   Para  3   Term  2   Preterm  1   AB  1   Living  3      SAB  1   IAB  0   Ectopic  0   Multiple  0   Live Births   3            Home Medications    Prior to Admission medications   Medication Sig Start Date End Date Taking? Authorizing Provider  methocarbamol  (ROBAXIN ) 500 MG tablet Take 1 tablet (500 mg total) by mouth 2 (two) times daily. 01/18/24  Yes Leath-Warren, Belen Bowers, NP  predniSONE  (DELTASONE ) 20 MG tablet Take 2 tablets (40 mg total) by mouth daily with breakfast for 5 days. 01/18/24 01/23/24 Yes Leath-Warren, Belen Bowers, NP  acetaminophen  (TYLENOL ) 500 MG tablet Take 500 mg by mouth every 6 (six) hours as needed.    [provider]  albuterol  (PROVENTIL ) (2.5 MG/3ML) 0.083% nebulizer solution Take 3 mLs (2.5 mg total) by nebulization every 6 (six) hours as needed for wheezing or shortness of breath. 05/11/23   Leath-Warren, Belen Bowers, NP  albuterol  (VENTOLIN  HFA) 108 (90 Base) MCG/ACT inhaler Inhale 2 puffs into the lungs every 6 (six) hours as needed. 05/11/23   Leath-Warren, Belen Bowers, NP  benzonatate  (TESSALON ) 100 MG capsule Take 1 capsule (100 mg total) by mouth every 8 (eight) hours. 10/11/23   Early Glisson, MD  budesonide -formoterol  (SYMBICORT ) 160-4.5 MCG/ACT inhaler Inhale 2 puffs into the lungs in the morning and at bedtime. 02/02/23   Leath-Warren, Janalyn Me  J, NP  cetirizine  (ZYRTEC ) 10 MG tablet Take 10 mg by mouth.    [provider]  escitalopram  (LEXAPRO ) 10 MG tablet Take 1 tablet (10 mg total) by mouth daily. 10/09/23   Javan Messing, NP  etonogestrel  (NEXPLANON ) 68 MG IMPL implant 1 each by Subdermal route once.    [provider]  fluticasone  (FLONASE ) 50 MCG/ACT nasal spray Place 2 sprays into both nostrils daily. 02/02/23   Leath-Warren, Belen Bowers, NP  ondansetron  (ZOFRAN -ODT) 4 MG disintegrating tablet Take 1 tablet (4 mg total) by mouth every 8 (eight) hours as needed for nausea. 10/11/23   Early Glisson, MD  Respiratory Therapy Supplies (NEBULIZER/TUBING/MOUTHPIECE) KIT Use as directed with nebulizer 12/14/20   Wurst, [place], PA-C    Family  History Family History  Problem Relation Age of Onset  . Diabetes Maternal Grandmother   . Hypertension Maternal Grandmother   . Diabetes Maternal Grandfather   . Asthma Maternal Grandfather   . Asthma Sister   . Breast cancer Maternal Aunt   . Colon cancer Neg Hx     Social History Social History   Tobacco Use  . Smoking status: Never  . Smokeless tobacco: Never  Vaping Use  . Vaping status: Former  . Quit date: 07/23/2021  Substance Use Topics  . Alcohol use: Not Currently    Comment: occ  . Drug use: Not Currently    Types: Marijuana     Allergies   Shrimp [shellfish allergy] and Sertraline    Review of Systems Review of Systems Per HPI  Physical Exam Triage Vital Signs ED Triage Vitals [01/18/24 0930]  Encounter Vitals Group     BP      Systolic BP Percentile      Diastolic BP Percentile      Pulse Rate 77     Resp 20     Temp 98.7 F (37.1 C)     Temp Source Oral     SpO2 96 %     Weight      Height      Head Circumference      Peak Flow      Pain Score 10     Pain Loc      Pain Education      Exclude from Growth Chart    No data found.  Updated Vital Signs Pulse 77   Temp 98.7 F (37.1 C) (Oral)   Resp 20   LMP 01/15/2024   SpO2 96%   Visual Acuity Right Eye Distance:   Left Eye Distance:   Bilateral Distance:    Right Eye Near:   Left Eye Near:    Bilateral Near:     Physical Exam Vitals and nursing note reviewed.  Constitutional:      General: She is not in acute distress.    Appearance: Normal appearance.  HENT:     Head: Normocephalic.  Eyes:     Extraocular Movements: Extraocular movements intact.     Conjunctiva/sclera: Conjunctivae normal.     Pupils: Pupils are equal, round, and reactive to light.  Neck:   Cardiovascular:     Rate and Rhythm: Normal rate and regular rhythm.  Pulmonary:     Effort: Pulmonary effort is normal.     Breath sounds: Normal breath sounds.  Musculoskeletal:     Cervical back:  Torticollis present. No erythema or signs of trauma. Pain with movement and muscular tenderness present. Decreased range of motion.  Skin:    General: Skin  is warm and dry.  Neurological:     General: No focal deficit present.     Mental Status: She is alert and oriented to person, place, and time.  Psychiatric:        Mood and Affect: Mood normal.        Behavior: Behavior normal.     UC Treatments / Results  Labs (all labs ordered are listed, but only abnormal results are displayed) Labs Reviewed - No data to display  EKG   Radiology No results found.  Procedures Procedures (including critical care time)  Medications Ordered in UC Medications  dexamethasone  (DECADRON ) injection 10 mg (10 mg Intramuscular Given 01/18/24 0958)  ketorolac  (TORADOL ) 30 MG/ML injection 30 mg (30 mg Intramuscular Given 01/18/24 0959)    Initial Impression / Assessment and Plan / UC Course  I have reviewed the triage vital signs and the nursing notes.  Pertinent labs & imaging results that were available during my care of the patient were reviewed by me and considered in my medical decision making (see chart for details).    Symptoms consistent with torticollis/cervical pain.  Patient was given Decadron  10 mg IM and Toradol  30 mg IM in the clinic to help with her neck pain or discomfort.  For her spasm, methocarbamol  500 mg was prescribed, and for her pain, ibuprofen  800 mg was prescribed.  Supportive care recommendations were provided to the patient to include over-the-counter Tylenol , the use of ice or heat, and stretching exercises.  Discussion with patient regarding consideration for orthopedics as this appears to be reoccurring.  Patient is in agreement with this plan of care and verbalizes understanding.  All questions were answered.  Patient stable for discharge   Final Clinical Impressions(s) / UC Diagnoses   Final diagnoses:  Torticollis  Neck pain   Discharge Instructions      You  were given an injection of Toradol  30 mg and Decadron  10 mg today.  Do not take any additional NSAIDs such as Aleve , ibuprofen , Advil , or naproxen .  You may take Tylenol  for breakthrough pain or discomfort. Take medication as prescribed.  As discussed, do not take ibuprofen  while you are taking the prednisone .  You may begin ibuprofen  once you complete the prednisone . Recommend the use of ice or heat as needed.  Apply ice for pain or swelling, heat for spasm or stiffness.  Apply for 20 minutes, remove for 1 hour, then repeat is much as possible. Gentle stretching and range of motion exercises while symptoms persist. If symptoms fail to improve with this treatment, you may follow-up in this clinic or with your primary care physician for further evaluation.  As discussed, consider follow-up with orthopedics for continued or reoccurring neck pain. Follow-up as needed.  ED Prescriptions     Medication Sig Dispense Auth. Provider   predniSONE  (DELTASONE ) 20 MG tablet Take 2 tablets (40 mg total) by mouth daily with breakfast for 5 days. 10 tablet Leath-Warren, Belen Bowers, NP   methocarbamol  (ROBAXIN ) 500 MG tablet Take 1 tablet (500 mg total) by mouth 2 (two) times daily. 20 tablet Leath-Warren, Belen Bowers, NP      PDMP not reviewed this encounter.   Hardy Lia, NP 01/18/24 1029    Leath-Warren, Belen Bowers, NP 01/18/24 1032

## 2024-01-31 ENCOUNTER — Encounter: Payer: Self-pay | Admitting: *Deleted

## 2024-01-31 ENCOUNTER — Ambulatory Visit
Admission: EM | Admit: 2024-01-31 | Discharge: 2024-01-31 | Disposition: A | Discharge status: Home / Self Care | Attending: Nurse Practitioner | Admitting: Nurse Practitioner

## 2024-01-31 DIAGNOSIS — N760 Acute vaginitis: Secondary | ICD-10-CM | POA: Diagnosis present

## 2024-01-31 DIAGNOSIS — Z113 Encounter for screening for infections with a predominantly sexual mode of transmission: Secondary | ICD-10-CM | POA: Diagnosis present

## 2024-01-31 LAB — POCT URINALYSIS DIP (MANUAL ENTRY)
Bilirubin, UA: NEGATIVE
Glucose, UA: NEGATIVE mg/dL
Ketones, POC UA: NEGATIVE mg/dL
Nitrite, UA: NEGATIVE
Protein Ur, POC: 100 mg/dL — AB
Spec Grav, UA: >=1.03 — AB (ref 1.010–1.025)
Urobilinogen, UA: 0.2 U/dL
pH, UA: 6 (ref 5.0–8.0)

## 2024-01-31 MED ORDER — NYSTATIN 100000 UNIT/GM EX CREA: TOPICAL_CREAM | CUTANEOUS | 0 refills | Status: DC

## 2024-01-31 MED ORDER — FLUCONAZOLE 150 MG PO TABS: ORAL_TABLET | ORAL | 0 refills | Status: DC

## 2024-01-31 MED ORDER — NITROFURANTOIN MONOHYD MACRO 100 MG PO CAPS: 100.0000 mg | ORAL_CAPSULE | Freq: Two times a day (BID) | ORAL | 0 refills | Status: DC

## 2024-01-31 NOTE — Discharge Instructions (Addendum)
 The urinalysis shows that you may have a urinary tract infection.  A urine culture, Cytology swab, HIV, and RPR test are pending.  You will be contacted if the pending test results are abnormal.  You will also access to your results via MyChart. Take medication as prescribed. You may take over-the-counter Tylenol  or ibuprofen  as needed for pain or discomfort. Continue warm sitz bath's as needed for pain or discomfort.  You may apply a small amount of Epsom salt as needed. Cleanse the vaginal area with warm water only while symptoms persist. Refrain from sexual intercourse until symptoms improve. If your results are positive and you require treatment, you will need to refrain from sexual intercourse for 7 days after completing treatment. If your test results are positive, you will need to notify all sexual partners. Increase condom use with each sexual encounter. Follow-up as needed.

## 2024-01-31 NOTE — ED Provider Notes (Signed)
 [facility]    CSN: [id number] Arrival date & time: 01/31/24  1153      History   Chief Complaint Chief Complaint  Patient presents with   SEXUALLY TRANSMITTED DISEASE    HPI Sharon Nash is a 28 y.o. female.   The history is provided by the patient.   Patient presents for complaints of vaginal irritation, itching, and swelling.  Patient also reports pain with urination and vaginal discharge.  Denies fever, chills, urinary frequency, urgency, hematuria, low back pain, flank pain, or pelvic pain.  Patient reports that she recently reunited with her children's father.  She also states at the same time, she used a new soap.  Last menstrual cycle 01/15/2024.  States that she has not been sexually active, but is concerned about the fathers status.  She is requesting STI testing along with HIV and syphilis testing.  Past Medical History:  Diagnosis Date   Asthma    Chronic abdominal pain    Chronic knee pain    Respiratory failure, acute (HCC) 06/2012   bipap only, not intubated, due to asthma exacerbation    Patient Active Problem List   Diagnosis Date Noted   Anxiety and depression 10/09/2023   Ventral hernia without obstruction or gangrene 10/09/2023   Postpartum depression 09/26/2021   Nexplanon  insertion 09/26/2021   Preterm delivery 08/22/2021   Abnormal chromosomal and genetic finding on antenatal screening mother 03/28/2021   Abnormal Pap smear of cervix 03/20/2021   Marijuana use 03/14/2021   Encounter for supervision of normal pregnancy, antepartum 03/07/2021   Rh negative state in antepartum period 03/07/2021   Gastroesophageal reflux disease without esophagitis 01/27/2017   Mild depression 02/20/2016   Asthma exacerbation 07/05/2012    Past Surgical History:  Procedure Laterality Date   NO PAST SURGERIES     None      OB History     Gravida  4   Para  3   Term  2   Preterm  1   AB  1   Living  3      SAB  1   IAB  0    Ectopic  0   Multiple  0   Live Births  3            Home Medications    Prior to Admission medications   Medication Sig Start Date End Date Taking? Authorizing Provider  budesonide -formoterol  (SYMBICORT ) 160-4.5 MCG/ACT inhaler Inhale 2 puffs into the lungs in the morning and at bedtime. 02/02/23  Yes Leath-Warren, Belen Bowers, NP  cetirizine  (ZYRTEC ) 10 MG tablet Take 10 mg by mouth.   Yes [provider]  escitalopram  (LEXAPRO ) 10 MG tablet Take 1 tablet (10 mg total) by mouth daily. 10/09/23  Yes Lendia Quay A, NP  fluconazole (DIFLUCAN) 150 MG tablet Take 1 tablet by mouth today.  May repeat every 3 days up to 2 additional doses. 01/31/24  Yes Leath-Warren, Belen Bowers, NP  fluticasone  (FLONASE ) 50 MCG/ACT nasal spray Place 2 sprays into both nostrils daily. 02/02/23  Yes Leath-Warren, Belen Bowers, NP  nitrofurantoin , macrocrystal-monohydrate, (MACROBID ) 100 MG capsule Take 1 capsule (100 mg total) by mouth 2 (two) times daily. 01/31/24  Yes Leath-Warren, Belen Bowers, NP  nystatin cream (MYCOSTATIN) Apply to affected area 2 times daily 01/31/24  Yes Leath-Warren, Belen Bowers, NP  acetaminophen  (TYLENOL ) 500 MG tablet Take 500 mg by mouth every 6 (six) hours as needed.    [provider]  albuterol  (  PROVENTIL ) (2.5 MG/3ML) 0.083% nebulizer solution Take 3 mLs (2.5 mg total) by nebulization every 6 (six) hours as needed for wheezing or shortness of breath. 05/11/23   Leath-Warren, Belen Bowers, NP  albuterol  (VENTOLIN  HFA) 108 (90 Base) MCG/ACT inhaler Inhale 2 puffs into the lungs every 6 (six) hours as needed. 05/11/23   Leath-Warren, Belen Bowers, NP  benzonatate  (TESSALON ) 100 MG capsule Take 1 capsule (100 mg total) by mouth every 8 (eight) hours. 10/11/23   Early Glisson, MD  etonogestrel  (NEXPLANON ) 68 MG IMPL implant 1 each by Subdermal route once.    [provider]  methocarbamol  (ROBAXIN ) 500 MG tablet Take 1 tablet (500 mg total) by mouth 2 (two) times daily.  01/18/24   Leath-Warren, Belen Bowers, NP  ondansetron  (ZOFRAN -ODT) 4 MG disintegrating tablet Take 1 tablet (4 mg total) by mouth every 8 (eight) hours as needed for nausea. 10/11/23   Early Glisson, MD  Respiratory Therapy Supplies (NEBULIZER/TUBING/MOUTHPIECE) KIT Use as directed with nebulizer 12/14/20   Wurst, [place], PA-C    Family History Family History  Problem Relation Age of Onset   Diabetes Maternal Grandmother    Hypertension Maternal Grandmother    Diabetes Maternal Grandfather    Asthma Maternal Grandfather    Asthma Sister    Breast cancer Maternal Aunt    Colon cancer Neg Hx     Social History Social History   Tobacco Use   Smoking status: Never   Smokeless tobacco: Never  Vaping Use   Vaping status: Former   Quit date: 07/23/2021  Substance Use Topics   Alcohol use: Not Currently    Comment: occ   Drug use: Yes    Types: Marijuana     Allergies   Shrimp [shellfish allergy] and Sertraline    Review of Systems Review of Systems Per HPI  Physical Exam Triage Vital Signs ED Triage Vitals  Encounter Vitals Group     BP 01/31/24 1231 123/84     Systolic BP Percentile --      Diastolic BP Percentile --      Pulse Rate 01/31/24 1231 96     Resp 01/31/24 1231 18     Temp 01/31/24 1231 98.7 F (37.1 C)     Temp Source 01/31/24 1231 Oral     SpO2 01/31/24 1231 97 %     Weight --      Height --      Head Circumference --      Peak Flow --      Pain Score 01/31/24 1230 10     Pain Loc --      Pain Education --      Exclude from Growth Chart --    No data found.  Updated Vital Signs BP 123/84 (BP Location: Right Arm)   Pulse 96   Temp 98.7 F (37.1 C) (Oral)   Resp 18   LMP 01/15/2024   SpO2 97%   Visual Acuity Right Eye Distance:   Left Eye Distance:   Bilateral Distance:    Right Eye Near:   Left Eye Near:    Bilateral Near:     Physical Exam Vitals and nursing note reviewed.  Constitutional:      General: She is not in acute  distress.    Appearance: Normal appearance.  HENT:     Head: Normocephalic.  Cardiovascular:     Rate and Rhythm: Normal rate and regular rhythm.     Pulses: Normal pulses.     Heart  sounds: Normal heart sounds.  Pulmonary:     Effort: Pulmonary effort is normal.     Breath sounds: Normal breath sounds.  Abdominal:     General: Bowel sounds are normal.     Palpations: Abdomen is soft.     Tenderness: There is no abdominal tenderness.  Genitourinary:    Comments: GU exam deferred, self swab performed  Musculoskeletal:     Cervical back: Normal range of motion.  Skin:    General: Skin is warm and dry.  Neurological:     General: No focal deficit present.     Mental Status: She is alert and oriented to person, place, and time.  Psychiatric:        Mood and Affect: Mood normal.        Behavior: Behavior normal.      UC Treatments / Results  Labs (all labs ordered are listed, but only abnormal results are displayed) Labs Reviewed  POCT URINALYSIS DIP (MANUAL ENTRY) - Abnormal; Notable for the following components:      Result Value   Clarity, UA cloudy (*)    Spec Grav, UA >=1.030 (*)    Blood, UA moderate (*)    Protein Ur, POC =100 (*)    Leukocytes, UA Small (1+) (*)    All other components within normal limits  URINE CULTURE  HIV ANTIBODY (ROUTINE TESTING W REFLEX)  RPR  CERVICOVAGINAL ANCILLARY ONLY    EKG   Radiology No results found.  Procedures Procedures (including critical care time)  Medications Ordered in UC Medications - No data to display  Initial Impression / Assessment and Plan / UC Course  I have reviewed the triage vital signs and the nursing notes.  Pertinent labs & imaging results that were available during my care of the patient were reviewed by me and considered in my medical decision making (see chart for details).  Cytology swab, HIV, and RPR test are pending.  Urinalysis with small leukocytes, protein, and blood, suspicious for  UTI.  Urine culture is pending.  Patient's vaginal symptoms are consistent with vaginitis.  Will treat with fluconazole 150 mg and nystatin cream in the interim.  For your possible UTI, Macrobid  100 mg prescribed.  Supportive care recommendations were provided and discussed with the patient to include continuing warm sitz bath's, cleansing the vaginal area with warm water only, and avoiding sexual intercourse until symptoms improve.  Patient was advised if the pending test results are abnormal, she will be contacted.  Patient advised she will need to notify all sexual partners, and refrain from sexual intercourse for an additional 7 days after completing treatment.  Patient was in agreement with this plan of care and verbalizes understanding.  All questions were answered.  Patient stable for discharge.   Final Clinical Impressions(s) / UC Diagnoses   Final diagnoses:  Vaginitis and vulvovaginitis  Screening examination for sexually transmitted disease     Discharge Instructions      The urinalysis shows that you may have a urinary tract infection.  A urine culture, Cytology swab, HIV, and RPR test are pending.  You will be contacted if the pending test results are abnormal.  You will also access to your results via MyChart. Take medication as prescribed. You may take over-the-counter Tylenol  or ibuprofen  as needed for pain or discomfort. Continue warm sitz bath's as needed for pain or discomfort.  You may apply a small amount of Epsom salt as needed. Cleanse the vaginal area with warm water  only while symptoms persist. Refrain from sexual intercourse until symptoms improve. If your results are positive and you require treatment, you will need to refrain from sexual intercourse for 7 days after completing treatment. If your test results are positive, you will need to notify all sexual partners. Increase condom use with each sexual encounter. Follow-up as needed.   ED Prescriptions      Medication Sig Dispense Auth. Provider   fluconazole (DIFLUCAN) 150 MG tablet Take 1 tablet by mouth today.  May repeat every 3 days up to 2 additional doses. 3 tablet Leath-Warren, Belen Bowers, NP   nystatin cream (MYCOSTATIN) Apply to affected area 2 times daily 60 g Leath-Warren, Belen Bowers, NP   nitrofurantoin , macrocrystal-monohydrate, (MACROBID ) 100 MG capsule Take 1 capsule (100 mg total) by mouth 2 (two) times daily. 10 capsule Leath-Warren, Belen Bowers, NP      PDMP not reviewed this encounter.   Hardy Lia, NP 01/31/24 1359

## 2024-01-31 NOTE — ED Triage Notes (Addendum)
 Pt states that she has vaginal discharge and her vagina is swollen. She thought it was a yeast infection at first but now she isnt sure. She did recently go back to her ex. She is doing sitz baths.   Pt states she would like cyto and blood work.

## 2024-02-01 LAB — URINE CULTURE

## 2024-02-01 LAB — RPR: RPR Ser Ql: NONREACTIVE

## 2024-02-01 LAB — HIV ANTIBODY (ROUTINE TESTING W REFLEX): HIV Screen 4th Generation wRfx: NONREACTIVE

## 2024-02-03 LAB — CERVICOVAGINAL ANCILLARY ONLY
Bacterial Vaginitis (gardnerella): POSITIVE — AB
Candida Glabrata: NEGATIVE
Candida Vaginitis: POSITIVE — AB
Chlamydia: NEGATIVE
Comment: NEGATIVE
Comment: NEGATIVE
Comment: NEGATIVE
Comment: NEGATIVE
Comment: NEGATIVE
Comment: NORMAL
Neisseria Gonorrhea: NEGATIVE
Trichomonas: POSITIVE — AB

## 2024-02-04 ENCOUNTER — Ambulatory Visit (HOSPITAL_COMMUNITY): Payer: Self-pay

## 2024-02-04 MED ORDER — METRONIDAZOLE 500 MG PO TABS: 500.0000 mg | ORAL_TABLET | Freq: Two times a day (BID) | ORAL | 0 refills | Status: AC

## 2024-07-13 ENCOUNTER — Emergency Department (HOSPITAL_COMMUNITY)

## 2024-07-13 ENCOUNTER — Encounter (HOSPITAL_COMMUNITY): Payer: Self-pay | Admitting: Emergency Medicine

## 2024-07-13 ENCOUNTER — Inpatient Hospital Stay (HOSPITAL_COMMUNITY)
Admission: EM | Admit: 2024-07-13 | Discharge: 2024-07-17 | DRG: 472 | Disposition: A | Discharge status: Home / Self Care | Attending: Neurosurgery | Admitting: Neurosurgery

## 2024-07-13 ENCOUNTER — Other Ambulatory Visit: Payer: Self-pay

## 2024-07-13 DIAGNOSIS — Z7951 Long term (current) use of inhaled steroids: Secondary | ICD-10-CM

## 2024-07-13 DIAGNOSIS — R109 Unspecified abdominal pain: Secondary | ICD-10-CM | POA: Diagnosis present

## 2024-07-13 DIAGNOSIS — S12690A Other displaced fracture of seventh cervical vertebra, initial encounter for closed fracture: Secondary | ICD-10-CM | POA: Diagnosis present

## 2024-07-13 DIAGNOSIS — Z803 Family history of malignant neoplasm of breast: Secondary | ICD-10-CM

## 2024-07-13 DIAGNOSIS — T148XXA Other injury of unspecified body region, initial encounter: Secondary | ICD-10-CM

## 2024-07-13 DIAGNOSIS — Y92410 Unspecified street and highway as the place of occurrence of the external cause: Secondary | ICD-10-CM

## 2024-07-13 DIAGNOSIS — S22059A Unspecified fracture of T5-T6 vertebra, initial encounter for closed fracture: Secondary | ICD-10-CM | POA: Diagnosis present

## 2024-07-13 DIAGNOSIS — S12630A Unspecified traumatic displaced spondylolisthesis of seventh cervical vertebra, initial encounter for closed fracture: Principal | ICD-10-CM | POA: Diagnosis present

## 2024-07-13 DIAGNOSIS — S12600A Unspecified displaced fracture of seventh cervical vertebra, initial encounter for closed fracture: Secondary | ICD-10-CM | POA: Diagnosis not present

## 2024-07-13 DIAGNOSIS — S22049A Unspecified fracture of fourth thoracic vertebra, initial encounter for closed fracture: Secondary | ICD-10-CM | POA: Diagnosis present

## 2024-07-13 DIAGNOSIS — S12530A Unspecified traumatic displaced spondylolisthesis of sixth cervical vertebra, initial encounter for closed fracture: Secondary | ICD-10-CM | POA: Diagnosis present

## 2024-07-13 DIAGNOSIS — Z8249 Family history of ischemic heart disease and other diseases of the circulatory system: Secondary | ICD-10-CM

## 2024-07-13 DIAGNOSIS — Z833 Family history of diabetes mellitus: Secondary | ICD-10-CM

## 2024-07-13 DIAGNOSIS — K219 Gastro-esophageal reflux disease without esophagitis: Secondary | ICD-10-CM | POA: Diagnosis present

## 2024-07-13 DIAGNOSIS — G8929 Other chronic pain: Secondary | ICD-10-CM | POA: Diagnosis present

## 2024-07-13 DIAGNOSIS — Z91013 Allergy to seafood: Secondary | ICD-10-CM

## 2024-07-13 DIAGNOSIS — M4312 Spondylolisthesis, cervical region: Secondary | ICD-10-CM | POA: Diagnosis present

## 2024-07-13 DIAGNOSIS — Z888 Allergy status to other drugs, medicaments and biological substances status: Secondary | ICD-10-CM

## 2024-07-13 DIAGNOSIS — Z825 Family history of asthma and other chronic lower respiratory diseases: Secondary | ICD-10-CM

## 2024-07-13 DIAGNOSIS — J45909 Unspecified asthma, uncomplicated: Secondary | ICD-10-CM | POA: Diagnosis present

## 2024-07-13 LAB — CBC WITH DIFFERENTIAL/PLATELET
Abs Immature Granulocytes: 0.05 K/uL (ref 0.00–0.07)
Basophils Absolute: 0 K/uL (ref 0.0–0.1)
Basophils Relative: 0 %
Eosinophils Absolute: 0 K/uL (ref 0.0–0.5)
Eosinophils Relative: 0 %
HCT: 38.7 % (ref 36.0–46.0)
Hemoglobin: 12.2 g/dL (ref 12.0–15.0)
Immature Granulocytes: 1 %
Lymphocytes Relative: 9 %
Lymphs Abs: 0.6 K/uL — ABNORMAL LOW (ref 0.7–4.0)
MCH: 28.7 pg (ref 26.0–34.0)
MCHC: 31.5 g/dL (ref 30.0–36.0)
MCV: 91.1 fL (ref 80.0–100.0)
Monocytes Absolute: 0.2 K/uL (ref 0.1–1.0)
Monocytes Relative: 4 %
Neutro Abs: 5.1 K/uL (ref 1.7–7.7)
Neutrophils Relative %: 86 %
Platelets: 352 K/uL (ref 150–400)
RBC: 4.25 MIL/uL (ref 3.87–5.11)
RDW: 13.3 % (ref 11.5–15.5)
WBC: 5.9 K/uL (ref 4.0–10.5)
nRBC: 0 % (ref 0.0–0.2)

## 2024-07-13 LAB — COMPREHENSIVE METABOLIC PANEL WITH GFR
ALT: 8 U/L (ref 0–44)
AST: 22 U/L (ref 15–41)
Albumin: 4.1 g/dL (ref 3.5–5.0)
Alkaline Phosphatase: 68 U/L (ref 38–126)
Anion gap: 13 (ref 5–15)
BUN: 9 mg/dL (ref 6–20)
CO2: 21 mmol/L — ABNORMAL LOW (ref 22–32)
Calcium: 9.4 mg/dL (ref 8.9–10.3)
Chloride: 104 mmol/L (ref 98–111)
Creatinine, Ser: 0.7 mg/dL (ref 0.44–1.00)
GFR, Estimated: >60 mL/min (ref >60)
Glucose, Bld: 109 mg/dL — ABNORMAL HIGH (ref 70–99)
Potassium: 4.1 mmol/L (ref 3.5–5.1)
Sodium: 138 mmol/L (ref 135–145)
Total Bilirubin: 0.4 mg/dL (ref 0.0–1.2)
Total Protein: 7.6 g/dL (ref 6.5–8.1)

## 2024-07-13 LAB — URINALYSIS, ROUTINE W REFLEX MICROSCOPIC
Bilirubin Urine: NEGATIVE
Glucose, UA: NEGATIVE mg/dL
Hgb urine dipstick: NEGATIVE
Ketones, ur: NEGATIVE mg/dL
Nitrite: NEGATIVE
Protein, ur: 30 mg/dL — AB
Specific Gravity, Urine: 1.013 (ref 1.005–1.030)
Squamous Epithelial / HPF: >50 /HPF (ref 0–5)
pH: 6 (ref 5.0–8.0)

## 2024-07-13 LAB — PREGNANCY, URINE: Preg Test, Ur: NEGATIVE

## 2024-07-13 MED ORDER — ONDANSETRON HCL 4 MG/2ML IJ SOLN
4.0000 mg | Freq: Once | INTRAMUSCULAR | Status: AC
  Administered 2024-07-13: 4 mg via INTRAVENOUS
  Filled 2024-07-13: qty 2

## 2024-07-13 MED ORDER — ACETAMINOPHEN 325 MG PO TABS
650.0000 mg | ORAL_TABLET | Freq: Once | ORAL | Status: AC
  Administered 2024-07-13: 650 mg via ORAL
  Filled 2024-07-13: qty 2

## 2024-07-13 MED ORDER — MORPHINE SULFATE (PF) 4 MG/ML IV SOLN
4.0000 mg | Freq: Once | INTRAVENOUS | Status: AC
  Administered 2024-07-13: 4 mg via INTRAVENOUS
  Filled 2024-07-13: qty 1

## 2024-07-13 MED ORDER — METHOCARBAMOL 500 MG PO TABS
500.0000 mg | ORAL_TABLET | Freq: Once | ORAL | Status: AC
Start: 2024-07-13 — End: 2024-07-13
  Administered 2024-07-13: 500 mg via ORAL
  Filled 2024-07-13: qty 1

## 2024-07-13 NOTE — ED Notes (Signed)
 Ambulatory to restroom
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
## 2024-07-13 NOTE — ED Notes (Signed)
 [place]-J placed and previous C-collar removed.
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
## 2024-07-14 ENCOUNTER — Emergency Department (HOSPITAL_COMMUNITY)

## 2024-07-14 ENCOUNTER — Inpatient Hospital Stay (HOSPITAL_COMMUNITY)

## 2024-07-14 DIAGNOSIS — R109 Unspecified abdominal pain: Secondary | ICD-10-CM | POA: Diagnosis present

## 2024-07-14 DIAGNOSIS — S12600A Unspecified displaced fracture of seventh cervical vertebra, initial encounter for closed fracture: Secondary | ICD-10-CM | POA: Diagnosis present

## 2024-07-14 DIAGNOSIS — Z833 Family history of diabetes mellitus: Secondary | ICD-10-CM | POA: Diagnosis not present

## 2024-07-14 DIAGNOSIS — Y92410 Unspecified street and highway as the place of occurrence of the external cause: Secondary | ICD-10-CM | POA: Diagnosis not present

## 2024-07-14 DIAGNOSIS — Z888 Allergy status to other drugs, medicaments and biological substances status: Secondary | ICD-10-CM | POA: Diagnosis not present

## 2024-07-14 DIAGNOSIS — Z91013 Allergy to seafood: Secondary | ICD-10-CM | POA: Diagnosis not present

## 2024-07-14 DIAGNOSIS — J45909 Unspecified asthma, uncomplicated: Secondary | ICD-10-CM | POA: Diagnosis present

## 2024-07-14 DIAGNOSIS — Z825 Family history of asthma and other chronic lower respiratory diseases: Secondary | ICD-10-CM | POA: Diagnosis not present

## 2024-07-14 DIAGNOSIS — G8929 Other chronic pain: Secondary | ICD-10-CM | POA: Diagnosis present

## 2024-07-14 DIAGNOSIS — S12690A Other displaced fracture of seventh cervical vertebra, initial encounter for closed fracture: Secondary | ICD-10-CM | POA: Diagnosis present

## 2024-07-14 DIAGNOSIS — Z7951 Long term (current) use of inhaled steroids: Secondary | ICD-10-CM | POA: Diagnosis not present

## 2024-07-14 DIAGNOSIS — S12630A Unspecified traumatic displaced spondylolisthesis of seventh cervical vertebra, initial encounter for closed fracture: Secondary | ICD-10-CM | POA: Diagnosis present

## 2024-07-14 DIAGNOSIS — Z8249 Family history of ischemic heart disease and other diseases of the circulatory system: Secondary | ICD-10-CM | POA: Diagnosis not present

## 2024-07-14 DIAGNOSIS — K219 Gastro-esophageal reflux disease without esophagitis: Secondary | ICD-10-CM | POA: Diagnosis present

## 2024-07-14 DIAGNOSIS — M4312 Spondylolisthesis, cervical region: Secondary | ICD-10-CM | POA: Diagnosis present

## 2024-07-14 DIAGNOSIS — Z803 Family history of malignant neoplasm of breast: Secondary | ICD-10-CM | POA: Diagnosis not present

## 2024-07-14 DIAGNOSIS — S12530A Unspecified traumatic displaced spondylolisthesis of sixth cervical vertebra, initial encounter for closed fracture: Secondary | ICD-10-CM | POA: Diagnosis present

## 2024-07-14 DIAGNOSIS — F418 Other specified anxiety disorders: Secondary | ICD-10-CM | POA: Diagnosis not present

## 2024-07-14 DIAGNOSIS — S22059A Unspecified fracture of T5-T6 vertebra, initial encounter for closed fracture: Secondary | ICD-10-CM | POA: Diagnosis present

## 2024-07-14 DIAGNOSIS — E669 Obesity, unspecified: Secondary | ICD-10-CM | POA: Diagnosis not present

## 2024-07-14 DIAGNOSIS — S22049A Unspecified fracture of fourth thoracic vertebra, initial encounter for closed fracture: Secondary | ICD-10-CM | POA: Diagnosis present

## 2024-07-14 MED ORDER — SODIUM CHLORIDE 0.9 % IV SOLN: Freq: Once | INTRAVENOUS | Status: AC

## 2024-07-14 MED ORDER — METHOCARBAMOL 500 MG PO TABS
1000.0000 mg | ORAL_TABLET | Freq: Three times a day (TID) | ORAL | Status: DC
  Administered 2024-07-14 – 2024-07-17 (×8): 1000 mg via ORAL
  Filled 2024-07-14 (×8): qty 2

## 2024-07-14 MED ORDER — OXYCODONE HCL 5 MG PO TABS
5.0000 mg | ORAL_TABLET | ORAL | Status: DC | PRN
  Administered 2024-07-14 – 2024-07-16 (×5): 10 mg via ORAL
  Filled 2024-07-14 (×5): qty 2

## 2024-07-14 MED ORDER — HYDROMORPHONE HCL 1 MG/ML IJ SOLN
0.5000 mg | INTRAMUSCULAR | Status: AC | PRN
  Administered 2024-07-14 (×3): 0.5 mg via INTRAVENOUS
  Filled 2024-07-14 (×3): qty 1

## 2024-07-14 MED ORDER — LIDOCAINE 5 % EX PTCH
1.0000 | MEDICATED_PATCH | CUTANEOUS | Status: DC
  Administered 2024-07-14 – 2024-07-17 (×5): 1 via TRANSDERMAL
  Filled 2024-07-14 (×5): qty 1

## 2024-07-14 MED ORDER — ENOXAPARIN SODIUM 40 MG/0.4ML IJ SOSY
40.0000 mg | PREFILLED_SYRINGE | Freq: Two times a day (BID) | INTRAMUSCULAR | Status: DC
  Administered 2024-07-15 – 2024-07-17 (×4): 40 mg via SUBCUTANEOUS
  Filled 2024-07-14 (×4): qty 0.4

## 2024-07-14 MED ORDER — METHOCARBAMOL 500 MG PO TABS: ORAL_TABLET | ORAL | 2 refills | Status: DC

## 2024-07-14 MED ORDER — PNEUMOCOCCAL 20-VAL CONJ VACC 0.5 ML IM SUSY
0.5000 mL | PREFILLED_SYRINGE | INTRAMUSCULAR | Status: DC
  Filled 2024-07-14: qty 0.5

## 2024-07-14 MED ORDER — HYDROCODONE-ACETAMINOPHEN 5-325 MG PO TABS
1.0000 | ORAL_TABLET | Freq: Four times a day (QID) | ORAL | Status: DC | PRN
Start: 2024-07-14 — End: 2024-07-15

## 2024-07-14 MED ORDER — KETOROLAC TROMETHAMINE 15 MG/ML IJ SOLN
30.0000 mg | Freq: Four times a day (QID) | INTRAMUSCULAR | Status: DC
  Administered 2024-07-14 – 2024-07-17 (×11): 30 mg via INTRAVENOUS
  Filled 2024-07-14 (×11): qty 2

## 2024-07-14 MED ORDER — HYDROCODONE-ACETAMINOPHEN 5-325 MG PO TABS: 1.0000 | ORAL_TABLET | Freq: Four times a day (QID) | ORAL | 0 refills | Status: DC | PRN

## 2024-07-14 MED ORDER — ACETAMINOPHEN 500 MG PO TABS
1000.0000 mg | ORAL_TABLET | Freq: Four times a day (QID) | ORAL | Status: DC
  Administered 2024-07-14 – 2024-07-17 (×9): 1000 mg via ORAL
  Filled 2024-07-14 (×9): qty 2
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
## 2024-07-14 NOTE — ED Notes (Signed)
 C collar in place on assessment.
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
## 2024-07-14 NOTE — Progress Notes (Signed)
 Patient arrived to unit via ED. Patient Aox4, vitals WNL and focused assessment completed. Patient oriented to room and unit.; Calllight in reach, bed in lowest position and grip socks applied.  Patient belongings: cell phone, chargers and clothes which is kept at bedside with patient.   07/14/24 2058  Vitals  Temp 99.2 F (37.3 C)  Temp Source Oral  BP 127/72  MAP (mmHg) 88  BP Location Right Arm  BP Method Automatic  Patient Position (if appropriate) Sitting  Pulse Rate 98  Pulse Rate Source Monitor  ECG Heart Rate 94  Resp 20  MEWS COLOR  MEWS Score Color Green  Oxygen  Therapy  SpO2 98 %  O2 Device Room Air  Pain Assessment  Pain Scale 0-10  Pain Score 0  MEWS Score  MEWS Temp 0  MEWS Systolic 0  MEWS Pulse 0  MEWS RR 0  MEWS LOC 0  MEWS Score 0

## 2024-07-14 NOTE — ED Notes (Signed)
 Family at bedside, pt denies any needs. Awaiting MRI

## 2024-07-14 NOTE — Progress Notes (Signed)
 Patient ID: Sharon Nash, female   DOB: [birth year], 28 y.o.   MRN: [id number] Vital sign's are stable patient is awake and alert with modest pain about her neck and shoulders Motor function remains good. I have advised that she should be discharged home in a hard cervical collar and I like to see her in 2 weeks time for follow-up visit.  I will write her discharge this evening along with prescription medications.

## 2024-07-14 NOTE — ED Notes (Signed)
 PT arrived via EMS, PT alert and talking IV continued and no needs noted. PT A&OX4 and VSS. PT placed with BP cuff and Pulse OX and C-Collar in place.
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
## 2024-07-15 ENCOUNTER — Other Ambulatory Visit: Payer: Self-pay | Admitting: Neurosurgery

## 2024-07-15 LAB — SURGICAL PCR SCREEN
MRSA, PCR: NEGATIVE
Staphylococcus aureus: NEGATIVE

## 2024-07-15 LAB — URINE CULTURE

## 2024-07-15 LAB — TYPE AND SCREEN
ABO/RH(D): O NEG
Antibody Screen: NEGATIVE

## 2024-07-15 MED ORDER — MUPIROCIN 2 % EX OINT: 1.0000 | TOPICAL_OINTMENT | Freq: Two times a day (BID) | CUTANEOUS | Status: DC

## 2024-07-15 MED ORDER — HYDROCODONE-ACETAMINOPHEN 5-325 MG PO TABS
1.0000 | ORAL_TABLET | Freq: Four times a day (QID) | ORAL | Status: DC | PRN
  Administered 2024-07-15 (×2): 2 via ORAL
  Filled 2024-07-15 (×2): qty 2

## 2024-07-15 MED ORDER — ONDANSETRON HCL 4 MG/2ML IJ SOLN
4.0000 mg | Freq: Three times a day (TID) | INTRAMUSCULAR | Status: DC | PRN
  Administered 2024-07-15 – 2024-07-16 (×2): 4 mg via INTRAVENOUS
  Filled 2024-07-15 (×2): qty 2

## 2024-07-15 NOTE — Anesthesia Preprocedure Evaluation (Addendum)
 Anesthesia Evaluation  Patient identified by MRN, date of birth, ID band Patient awake    Reviewed: Allergy & Precautions, NPO status , Patient's Chart, lab work & pertinent test results  History of Anesthesia Complications Negative for: history of anesthetic complications  Airway Mallampati: III   Neck ROM: Limited   Comment: [place] J collar on Dental  (+) Dental Advisory Given   Pulmonary neg shortness of breath, asthma , neg sleep apnea, neg COPD, neg recent URI   Pulmonary exam normal breath sounds clear to auscultation       Cardiovascular negative cardio ROS  Rhythm:Regular Rate:Normal     Neuro/Psych neg Seizures PSYCHIATRIC DISORDERS Anxiety Depression    superior endplate fracture of T4 and T5    GI/Hepatic ,GERD  ,,(+)     substance abuse  marijuana use  Endo/Other  negative endocrine ROS    Renal/GU negative Renal ROS     Musculoskeletal   Abdominal  (+) + obese  Peds  Hematology negative hematology ROS (+)   Anesthesia Other Findings   Reproductive/Obstetrics                              Anesthesia Physical Anesthesia Plan  ASA: 3  Anesthesia Plan: General   Post-op Pain Management: Tylenol  PO (pre-op)*   Induction: Intravenous  PONV Risk Score and Plan: 3 and Ondansetron , Dexamethasone , Midazolam and Treatment may vary due to age or medical condition  Airway Management Planned: Oral ETT and Video Laryngoscope Planned  Additional Equipment:   Intra-op Plan:   Post-operative Plan: Extubation in OR  Informed Consent: I have reviewed the patients History and Physical, chart, labs and discussed the procedure including the risks, benefits and alternatives for the proposed anesthesia with the patient or authorized representative who has indicated his/her understanding and acceptance.     Dental advisory given  Plan Discussed with: CRNA and  Anesthesiologist  Anesthesia Plan Comments: (Risks of general anesthesia discussed including, but not limited to, sore throat, hoarse voice, chipped/damaged teeth, injury to vocal cords, nausea and vomiting, allergic reactions, lung infection, heart attack, stroke, and death. All questions answered. )         Anesthesia Quick Evaluation

## 2024-07-15 NOTE — Plan of Care (Signed)
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
## 2024-07-15 NOTE — Progress Notes (Signed)
 SLP Cancellation Note  Patient Details Name: MEEGHAN SKIPPER MRN: [id number] DOB: 1996-05-20   Cancelled treatment:       Reason Eval/Treat Not Completed: Other (comment) Attempted x2 will f/u    Alinna Siple, Consuelo Fitch 07/15/2024, 2:43 PM

## 2024-07-15 NOTE — Progress Notes (Signed)
 Patient ID: Sharon Nash, female   DOB: [birth year], 28 y.o.   MRN: [id number]. BP (!) 126/97 (BP Location: Right Arm)   Pulse 84   Temp 98.3 F (36.8 C) (Oral)   Resp 18   Ht 5' 7 (1.702 m)   Wt 97.5 kg   LMP 06/21/2024   SpO2 97%   BMI 33.67 kg/m  Alert and oriented x 4.  Moving all extremities well I had a long discussion with Miss [name].  I believe that she should undergo an ACDF at C6/7 for perched facet, facet fracture, and traumatic listhesis of 6 on 7. I went over the procedure, explained my rationale, and she has agreed to proceed with the ACDF tomorrow.
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
## 2024-07-16 ENCOUNTER — Inpatient Hospital Stay (HOSPITAL_COMMUNITY)

## 2024-07-16 ENCOUNTER — Inpatient Hospital Stay (HOSPITAL_COMMUNITY)
Admission: EM | Disposition: A | Discharge status: Home / Self Care | Payer: Self-pay | Source: Home / Self Care | Attending: Neurological Surgery

## 2024-07-16 ENCOUNTER — Encounter (HOSPITAL_COMMUNITY): Payer: Self-pay | Admitting: Neurological Surgery

## 2024-07-16 ENCOUNTER — Inpatient Hospital Stay (HOSPITAL_COMMUNITY): Payer: Self-pay | Admitting: Anesthesiology

## 2024-07-16 ENCOUNTER — Encounter (HOSPITAL_COMMUNITY): Payer: Self-pay | Admitting: Anesthesiology

## 2024-07-16 DIAGNOSIS — S12600A Unspecified displaced fracture of seventh cervical vertebra, initial encounter for closed fracture: Secondary | ICD-10-CM | POA: Diagnosis present

## 2024-07-16 DIAGNOSIS — J45909 Unspecified asthma, uncomplicated: Secondary | ICD-10-CM

## 2024-07-16 DIAGNOSIS — E669 Obesity, unspecified: Secondary | ICD-10-CM

## 2024-07-16 DIAGNOSIS — F418 Other specified anxiety disorders: Secondary | ICD-10-CM

## 2024-07-16 DIAGNOSIS — Z6833 Body mass index (BMI) 33.0-33.9, adult: Secondary | ICD-10-CM

## 2024-07-16 HISTORY — PX: ANTERIOR CERVICAL DECOMP/DISCECTOMY FUSION: SHX1161

## 2024-07-16 LAB — CBC
HCT: 36.2 % (ref 36.0–46.0)
Hemoglobin: 11.4 g/dL — ABNORMAL LOW (ref 12.0–15.0)
MCH: 28.8 pg (ref 26.0–34.0)
MCHC: 31.5 g/dL (ref 30.0–36.0)
MCV: 91.4 fL (ref 80.0–100.0)
Platelets: 313 K/uL (ref 150–400)
RBC: 3.96 MIL/uL (ref 3.87–5.11)
RDW: 13.2 % (ref 11.5–15.5)
WBC: 9.2 K/uL (ref 4.0–10.5)
nRBC: 0 % (ref 0.0–0.2)

## 2024-07-16 LAB — CREATININE, SERUM
Creatinine, Ser: 0.76 mg/dL (ref 0.44–1.00)
GFR, Estimated: >60 mL/min (ref >60)

## 2024-07-16 MED ORDER — DIAZEPAM 5 MG PO TABS
5.0000 mg | ORAL_TABLET | Freq: Four times a day (QID) | ORAL | Status: DC | PRN
  Administered 2024-07-16 – 2024-07-17 (×2): 5 mg via ORAL
  Filled 2024-07-16 (×2): qty 1

## 2024-07-16 MED ORDER — OXYCODONE HCL ER 10 MG PO T12A
10.0000 mg | EXTENDED_RELEASE_TABLET | Freq: Two times a day (BID) | ORAL | Status: DC
  Administered 2024-07-16 – 2024-07-17 (×2): 10 mg via ORAL
  Filled 2024-07-16 (×2): qty 1

## 2024-07-16 MED ORDER — FENTANYL CITRATE (PF) 250 MCG/5ML IJ SOLN
INTRAMUSCULAR | Status: AC
  Filled 2024-07-16: qty 5

## 2024-07-16 MED ORDER — FENTANYL CITRATE (PF) 250 MCG/5ML IJ SOLN
INTRAMUSCULAR | Status: DC | PRN
  Administered 2024-07-16: 100 ug via INTRAVENOUS
  Administered 2024-07-16 (×3): 50 ug via INTRAVENOUS

## 2024-07-16 MED ORDER — OXYCODONE HCL 5 MG/5ML PO SOLN: 5.0000 mg | Freq: Once | ORAL | Status: DC | PRN

## 2024-07-16 MED ORDER — ALBUTEROL SULFATE HFA 108 (90 BASE) MCG/ACT IN AERS
INHALATION_SPRAY | RESPIRATORY_TRACT | Status: DC | PRN
  Administered 2024-07-16: 10 via RESPIRATORY_TRACT

## 2024-07-16 MED ORDER — AMISULPRIDE (ANTIEMETIC) 5 MG/2ML IV SOLN: 10.0000 mg | Freq: Once | INTRAVENOUS | Status: DC | PRN

## 2024-07-16 MED ORDER — ACETAMINOPHEN 10 MG/ML IV SOLN
INTRAVENOUS | Status: AC
  Filled 2024-07-16: qty 100

## 2024-07-16 MED ORDER — LORATADINE 10 MG PO TABS
10.0000 mg | ORAL_TABLET | Freq: Every day | ORAL | Status: DC
  Administered 2024-07-16 – 2024-07-17 (×2): 10 mg via ORAL
  Filled 2024-07-16 (×2): qty 1

## 2024-07-16 MED ORDER — MORPHINE SULFATE (PF) 2 MG/ML IV SOLN: 2.0000 mg | INTRAVENOUS | Status: DC | PRN

## 2024-07-16 MED ORDER — POTASSIUM CHLORIDE IN NACL 20-0.9 MEQ/L-% IV SOLN
INTRAVENOUS | Status: DC
  Filled 2024-07-16 (×2): qty 1000

## 2024-07-16 MED ORDER — LACTATED RINGERS IV SOLN: INTRAVENOUS | Status: DC | PRN

## 2024-07-16 MED ORDER — OXYCODONE HCL 5 MG PO TABS
5.0000 mg | ORAL_TABLET | ORAL | Status: DC | PRN
  Administered 2024-07-16 – 2024-07-17 (×3): 5 mg via ORAL
  Filled 2024-07-16 (×3): qty 1

## 2024-07-16 MED ORDER — VITAMIN D 25 MCG (1000 UNIT) PO TABS
1000.0000 [IU] | ORAL_TABLET | Freq: Every morning | ORAL | Status: DC
  Administered 2024-07-17: 1000 [IU] via ORAL
  Filled 2024-07-16: qty 1

## 2024-07-16 MED ORDER — PROPOFOL 10 MG/ML IV BOLUS
INTRAVENOUS | Status: AC
  Filled 2024-07-16: qty 20

## 2024-07-16 MED ORDER — THROMBIN (RECOMBINANT) 5000 UNITS EX SOLR
CUTANEOUS | Status: DC | PRN
  Administered 2024-07-16: 10 mL via TOPICAL

## 2024-07-16 MED ORDER — HYDROMORPHONE HCL 1 MG/ML IJ SOLN
INTRAMUSCULAR | Status: AC
  Filled 2024-07-16: qty 1

## 2024-07-16 MED ORDER — ROCURONIUM BROMIDE 100 MG/10ML IV SOLN
INTRAVENOUS | Status: DC | PRN
  Administered 2024-07-16: 70 mg via INTRAVENOUS
  Administered 2024-07-16: 20 mg via INTRAVENOUS

## 2024-07-16 MED ORDER — ACETAMINOPHEN 500 MG PO TABS: 1000.0000 mg | ORAL_TABLET | Freq: Once | ORAL | Status: DC

## 2024-07-16 MED ORDER — MAGNESIUM CITRATE PO SOLN
1.0000 | Freq: Once | ORAL | Status: DC | PRN
Start: 2024-07-16 — End: 2024-07-17

## 2024-07-16 MED ORDER — OXYCODONE HCL 5 MG PO TABS: 5.0000 mg | ORAL_TABLET | Freq: Once | ORAL | Status: DC | PRN

## 2024-07-16 MED ORDER — THROMBIN 5000 UNITS EX KIT
PACK | CUTANEOUS | Status: AC
Start: 2024-07-16 — End: 2024-07-16
  Filled 2024-07-16: qty 2

## 2024-07-16 MED ORDER — CHLORHEXIDINE GLUCONATE 0.12 % MT SOLN
15.0000 mL | Freq: Once | OROMUCOSAL | Status: AC
  Administered 2024-07-16: 15 mL via OROMUCOSAL
  Filled 2024-07-16: qty 15

## 2024-07-16 MED ORDER — LIDOCAINE HCL (PF) 2 % IJ SOLN
INTRAMUSCULAR | Status: DC | PRN
  Administered 2024-07-16: 100 mg via INTRADERMAL

## 2024-07-16 MED ORDER — LIDOCAINE-EPINEPHRINE 0.5 %-1:200000 IJ SOLN
INTRAMUSCULAR | Status: DC | PRN
  Administered 2024-07-16: 4 mL

## 2024-07-16 MED ORDER — HYDROMORPHONE HCL 1 MG/ML IJ SOLN
0.2500 mg | INTRAMUSCULAR | Status: DC | PRN
  Administered 2024-07-16 (×2): 0.5 mg via INTRAVENOUS

## 2024-07-16 MED ORDER — ORAL CARE MOUTH RINSE
15.0000 mL | Freq: Once | OROMUCOSAL | Status: AC
Start: 2024-07-16 — End: 2024-07-16

## 2024-07-16 MED ORDER — MIDAZOLAM HCL 2 MG/2ML IJ SOLN
INTRAMUSCULAR | Status: AC
  Filled 2024-07-16: qty 2

## 2024-07-16 MED ORDER — ADULT MULTIVITAMIN W/MINERALS CH
1.0000 | ORAL_TABLET | Freq: Every morning | ORAL | Status: DC
  Administered 2024-07-17: 1 via ORAL
  Filled 2024-07-16: qty 1

## 2024-07-16 MED ORDER — BISACODYL 5 MG PO TBEC: 5.0000 mg | DELAYED_RELEASE_TABLET | Freq: Every day | ORAL | Status: DC | PRN

## 2024-07-16 MED ORDER — MIDAZOLAM HCL 5 MG/5ML IJ SOLN
INTRAMUSCULAR | Status: DC | PRN
  Administered 2024-07-16: 2 mg via INTRAVENOUS

## 2024-07-16 MED ORDER — LACTATED RINGERS IV SOLN: INTRAVENOUS | Status: DC

## 2024-07-16 MED ORDER — DEXAMETHASONE SOD PHOSPHATE PF 10 MG/ML IJ SOLN
INTRAMUSCULAR | Status: DC | PRN
  Administered 2024-07-16: 10 mg via INTRAVENOUS

## 2024-07-16 MED ORDER — CEFAZOLIN SODIUM-DEXTROSE 2-4 GM/100ML-% IV SOLN
INTRAVENOUS | Status: AC
  Filled 2024-07-16: qty 100

## 2024-07-16 MED ORDER — FLUTICASONE PROPIONATE 50 MCG/ACT NA SUSP
2.0000 | Freq: Every day | NASAL | Status: DC
  Administered 2024-07-17: 2 via NASAL
  Filled 2024-07-16: qty 16

## 2024-07-16 MED ORDER — ALBUTEROL SULFATE (2.5 MG/3ML) 0.083% IN NEBU
3.0000 mL | INHALATION_SOLUTION | Freq: Four times a day (QID) | RESPIRATORY_TRACT | Status: DC | PRN
Start: 2024-07-16 — End: 2024-07-17

## 2024-07-16 MED ORDER — SENNA 8.6 MG PO TABS
1.0000 | ORAL_TABLET | Freq: Two times a day (BID) | ORAL | Status: DC
  Administered 2024-07-16 – 2024-07-17 (×2): 8.6 mg via ORAL
  Filled 2024-07-16 (×2): qty 1

## 2024-07-16 MED ORDER — CEFAZOLIN SODIUM-DEXTROSE 2-3 GM-%(50ML) IV SOLR
INTRAVENOUS | Status: DC | PRN
  Administered 2024-07-16: 2 g via INTRAVENOUS

## 2024-07-16 MED ORDER — HYDROMORPHONE HCL 1 MG/ML IJ SOLN: 0.2500 mg | INTRAMUSCULAR | Status: DC | PRN

## 2024-07-16 MED ORDER — SODIUM CHLORIDE 0.9% FLUSH
3.0000 mL | INTRAVENOUS | Status: DC | PRN
Start: 2024-07-16 — End: 2024-07-17

## 2024-07-16 MED ORDER — AMISULPRIDE (ANTIEMETIC) 5 MG/2ML IV SOLN
INTRAVENOUS | Status: AC
  Filled 2024-07-16: qty 4

## 2024-07-16 MED ORDER — 0.9 % SODIUM CHLORIDE (POUR BTL) OPTIME
TOPICAL | Status: DC | PRN
  Administered 2024-07-16: 1000 mL

## 2024-07-16 MED ORDER — MENTHOL 3 MG MT LOZG: 1.0000 | LOZENGE | OROMUCOSAL | Status: DC | PRN

## 2024-07-16 MED ORDER — SODIUM CHLORIDE 0.9% FLUSH
3.0000 mL | Freq: Two times a day (BID) | INTRAVENOUS | Status: DC
  Administered 2024-07-16 – 2024-07-17 (×2): 3 mL via INTRAVENOUS

## 2024-07-16 MED ORDER — PHENOL 1.4 % MT LIQD
1.0000 | OROMUCOSAL | Status: DC | PRN
  Administered 2024-07-16: 1 via OROMUCOSAL
  Filled 2024-07-16: qty 177

## 2024-07-16 MED ORDER — LIDOCAINE-EPINEPHRINE 0.5 %-1:200000 IJ SOLN
INTRAMUSCULAR | Status: AC
  Filled 2024-07-16: qty 50

## 2024-07-16 MED ORDER — SENNOSIDES-DOCUSATE SODIUM 8.6-50 MG PO TABS: 1.0000 | ORAL_TABLET | Freq: Every evening | ORAL | Status: DC | PRN

## 2024-07-16 MED ORDER — SODIUM CHLORIDE 0.9 % IV SOLN: 250.0000 mL | INTRAVENOUS | Status: DC

## 2024-07-16 MED ORDER — ONDANSETRON HCL 4 MG/2ML IJ SOLN
INTRAMUSCULAR | Status: DC | PRN
  Administered 2024-07-16: 4 mg via INTRAVENOUS

## 2024-07-16 MED ORDER — ACETAMINOPHEN 10 MG/ML IV SOLN
INTRAVENOUS | Status: DC | PRN
  Administered 2024-07-16: 1000 mg via INTRAVENOUS

## 2024-07-16 MED ORDER — AMISULPRIDE (ANTIEMETIC) 5 MG/2ML IV SOLN
10.0000 mg | Freq: Once | INTRAVENOUS | Status: DC | PRN
  Administered 2024-07-16: 10 mg via INTRAVENOUS

## 2024-07-16 MED ORDER — ACETAMINOPHEN 500 MG PO TABS
1000.0000 mg | ORAL_TABLET | Freq: Four times a day (QID) | ORAL | Status: DC | PRN
Start: 2024-07-16 — End: 2024-07-17
  Administered 2024-07-16: 1000 mg via ORAL

## 2024-07-16 MED ORDER — PROPOFOL 10 MG/ML IV BOLUS
INTRAVENOUS | Status: DC | PRN
  Administered 2024-07-16: 200 mg via INTRAVENOUS

## 2024-07-16 SURGICAL SUPPLY — 2 items
NDL HYPO 25X1 1.5 SAFETY (NEEDLE) ×1 IMPLANT
NDL SPNL 22GX3.5 QUINCKE BK (NEEDLE) ×1 IMPLANT

## 2024-07-16 NOTE — Progress Notes (Signed)
 OT Cancellation Note  Patient Details Name: Sharon Nash MRN: [id number] DOB: 05-13-1996   Cancelled Treatment:    Reason Eval/Treat Not Completed: Other (comment)- noted plans for ACDF today.  Will follow up and see post op when appropriate.   Etta NOVAK, OT Acute Rehabilitation Services Office [phone] Secure Chat Preferred    Etta GORMAN Hope 07/16/2024, 8:18 AM

## 2024-07-16 NOTE — Progress Notes (Signed)
   07/16/24 1832  Vitals  Temp 98.4 F (36.9 C)  Temp Source Oral  BP 127/79  MAP (mmHg) 94  BP Location Right Arm  BP Method Automatic  Patient Position (if appropriate) Lying  Pulse Rate 88  Pulse Rate Source Monitor  Resp 18  Level of Consciousness  Level of Consciousness Alert  MEWS COLOR  MEWS Score Color Green  Oxygen  Therapy  SpO2 94 %  O2 Device Room Air  MEWS Score  MEWS Temp 0  MEWS Systolic 0  MEWS Pulse 0  MEWS RR 0  MEWS LOC 0  MEWS Score 0   Pt on unit from PACU, a&o x4, VSS, incision to neck is clean and dry.

## 2024-07-16 NOTE — Progress Notes (Signed)
 Pt off unit to OR

## 2024-07-16 NOTE — Progress Notes (Signed)
 PT Cancellation Note  Patient Details Name: MAIZEE REINHOLD MRN: [id number] DOB: September 24, 1995   Cancelled Treatment:    Reason Eval/Treat Not Completed: Patient at procedure or test/unavailable - plan for cervical fusion this date, PT to check back post-op.   Tara Wich S, PT DPT Acute Rehabilitation Services Secure Chat Preferred  Office [phone]    Johana FORBES [place] 07/16/2024, 8:56 AM

## 2024-07-16 NOTE — Progress Notes (Signed)
 BP 133/81   Pulse 73   Temp 98.7 F (37.1 C) (Oral)   Resp 16   Ht 5' 7 (1.702 m)   Wt 97.5 kg   LMP 06/21/2024   SpO2 98%   BMI 33.67 kg/m  Sharon Nash has decided to undergo an anterior cervical decompression and arthrodesis for a traumatic listhesis and cervical fracture at levels C6/7. Risks and benefits including but not limited to bleeding, infection, paralysis, weakness in one or both extremities, bowel and/or bladder dysfunction, fusion failure, hardware failure, need for further surgery, no relief of pain. She understands and wishes to proceed.  Alert and oriented x 4, speech is clear and fluent Moving all extremities Tongue and uvula midline

## 2024-07-16 NOTE — Discharge Instructions (Signed)
Anterior Cervical Fusion Care After Pinching of the nerves is a common cause of long-term pain. When this happens, a procedure called an anterior cervical fusion is sometimes performed. It relieves the pressure on the pinched nerve roots or spinal cord in the neck. An anterior cervical fusion means that the operation is done through the front (anterior) of your neck to fuse bones in your neck together. This procedure is done to relieve the pressure on pinched nerve roots or spinal cord. This operation is done to control the movement of your spine, which may be pressing on the nerves. This may relieve the pain. The procedure that stops the movement of the spine is called a fusion. The cut by the surgeon (incision) is usually within a skin fold line under your chin. After moving the neck muscles gently apart, the neurosurgeon uses an operating microscope and removes the injured intervertebral disk (the cushion or pad of tissue between the bones of the spine). This takes the pressure off the nerves or spinal cord. This is called decompression. The area where the disc was removed is then filled with a bone graft. The graft will fuse the vertebrae together over time. This means it causes the vertebral bodies to grow together. The bone graft may be obtained from your own bone (your hip for example), or may be obtained from a bone bank. Receiving bone from a bone bank is similar to a blood bank, only the bone comes from human donors who have recently died. This type of graft is referred to as allograft bone. The preformed bone plug is safe and will not be rejected by your body. It does not contain blood cells. In some cases, the surgeon may use hardware in your neck to help stabilize it. This means that metal plates or pins or screws may be used to:  Provide extra support to the neck.   Help the bones to grow together more easily.  A cervical fusion procedure takes a couple hours to several hours, depending on  what needs to be done. Your caregiver will be able to answer your questions for you. HOME CARE INSTRUCTIONS   It will be normal to have a sore throat and have difficulty swallowing foods for a couple weeks following surgery. See your caregiver if this seems to be getting worse rather than better.   You may resume normal diet and activities as directed or allowed. Generally, walking and stair climbing are fine. Avoid lifting more than ten pounds and do no lifting above your head.   If given a cervical collar, remove only for bathing and eating, or as directed.   Use only showers for cleaning up, with no bathing, until seen.   You may apply ice to the surgical or bone donor site for 15 to 20 minutes each hour while awake for the first couple days following surgery. Put the ice in a plastic bag and place a towel between the bag of ice and your skin.   Change dressings if necessary or as directed.   You may drive in 10 days   Take prescribed medication as directed. Only take over-the-counter or prescription medicines for pain, discomfort, or fever as directed by your caregiver.   Make an appointment to see your caregiver for suture or staple removal when instructed.   If physical therapy was prescribed, follow your caregiver's directions.  SEEK IMMEDIATE MEDICAL CARE IF:  There is redness, swelling, or increasing pain in the wound.   There is  pus coming from the wound.   An unexplained oral temperature over 102 F (38.9 C) develops.   There is a bad smell coming from the wound or dressing.   You have swelling in your calf or leg.   You develop shortness of breath or chest pain.   The wound edges break open after sutures or staples have been removed.   Your pain is not controlled with medicine.   You seem to be getting worse rather than better.  Document Released: 04/09/2004 Document Revised: 05/08/2011 Document Reviewed: 06/15/2008 [facility] Patient Information 2012 [place].

## 2024-07-16 NOTE — Op Note (Signed)
 07/16/2024  5:54 PM  PATIENT:  Sharon Nash  28 y.o. 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 Dorn  ANESTHESIA:   general  EBL:  Total I/O In: 1150 [I.V.:1000; IV Piggyback:150] Out: -   BLOOD ADMINISTERED:none  CELL SAVER GIVEN:none  COUNT:per nursing  DRAINS: none   SPECIMEN:  No Specimen  DICTATION: Ms. Benham was taken to the operating room, intubated, and placed under general anesthesia without difficulty. She was positioned supine with her head in slight extension on a horseshoe headrest. The neck was prepped and draped in a sterile manner. I infiltrated 4 cc's 1/2%lidocaine /1:200,000 strength epinephrine into the planned incision starting from the midline to the medial border of the left sternocleidomastoid muscle. I opened the incision with a 10 blade and dissected sharply through soft tissue to the platysma. I dissected in the plane superior to the platysma both rostrally and caudally. I then opened the platysma in a horizontal fashion with Metzenbaum scissors, and dissected in the inferior plane rostrally and caudally. With both blunt and sharp technique I created an avascular corridor to the cervical spine. I placed a spinal needle(s) in the disc space at C4/5, and 5/6 . I then reflected the longus colli from C6 to C7 and placed self retaining retractors. I opened the disc space(s) at C6/7 with a 15 blade. I removed disc with curettes, Kerrison punches, and the drill. Using the drill I removed osteophytes and prepared for the decompression.  We decompressed the spinal canal and the C7 root(s) with the drill, Kerrison punches, and the curettes. I used the  microscope to aid in microdissection. I removed the posterior longitudinal ligament to fully expose and decompress the thecal sac. I exposed the roots laterally taking down the 6/7 uncovertebral joints. With the decompression complete I moved on to the arthrodesis. I used the drill to level the surfaces of C6, and 7. I removed soft tissue to prepare the disc space and the bony surfaces. I measured the space and placed a 9mm structural allograft into the disc space.  I then placed the anterior instrumentation. I placed 2 screws in each vertebral body through the plate. I locked the screws into place. Intraoperative xray showed the graft, plate, and screws to be in good position. I irrigated the wound, achieved hemostasis, and closed the wound in layers. I approximated the platysma, and the subcuticular plane with vicryl sutures. I used Dermabond for a sterile dressing.   PLAN OF CARE: Admit to inpatient   PATIENT DISPOSITION:  PACU - hemodynamically stable.   Delay start of Pharmacological VTE agent (>24hrs) due to surgical blood loss or risk of bleeding:  no

## 2024-07-16 NOTE — Discharge Summary (Signed)
 Physician Discharge Summary  Patient ID: Sharon Nash MRN: [id number] DOB/AGE: May 18, 1996 28 y.o.  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 She was taken to the operating room for an uncomplicated ACDF at C6/7 11/07. Post op she is doing well, ambulating, voiding, and tolerating a regular diet. Voice is strong, moving all extremities well. Will be discharged to home.   Treatments: surgery: as above, nuvasive hardware, 9mm allograft  Discharge Exam: Blood pressure 127/79, pulse 88, temperature 98.4 F (36.9 C), temperature source Oral, resp. rate 18, height 5' 7 (1.702 m), weight 97.5 kg, last menstrual period 06/21/2024, SpO2 94%. General appearance: alert, cooperative, appears stated age, and no distress  Disposition: Discharge disposition: 01-Home or Self Care      cervical fracture   Allergies as of 07/16/2024       Reactions   Shrimp [shellfish Allergy] Anaphylaxis   Sertraline  Other (See Comments)   Having bad dreams        Medication List     TAKE these medications    acetaminophen  500 MG tablet Commonly known as: TYLENOL  Take 1,000 mg by mouth every 6 (six) hours as needed for mild pain (pain score 1-3) or headache.   albuterol  108 (90 Base) MCG/ACT inhaler Commonly known as: VENTOLIN  HFA Inhale 2 puffs into the lungs every 6 (six) hours as needed.   albuterol  (2.5 MG/3ML) 0.083% nebulizer solution Commonly known as: PROVENTIL  Take 3 mLs (2.5 mg total) by nebulization every 6 (six) hours as needed for wheezing or shortness of breath.    budesonide -formoterol  160-4.5 MCG/ACT inhaler Commonly known as: Symbicort  Inhale 2 puffs into the lungs in the morning and at bedtime.   cetirizine  10 MG tablet Commonly known as: ZYRTEC  Take 10 mg by mouth daily as needed for allergies or rhinitis.   Cholecalciferol 25 MCG (1000 UT) capsule Take 1,000 Units by mouth in the morning.   fluticasone  50 MCG/ACT nasal spray Commonly known as: FLONASE  Place 2 sprays into both nostrils daily.   HAIR SKIN & NAILS GUMMIES PO Take 2 each by mouth in the morning. Chew two gummies by mouth daily in the morning   HYDROcodone -acetaminophen  5-325 MG tablet Commonly known as: NORCO/VICODIN Take 1 tablet by mouth every 6 (six) hours as needed for severe pain (pain score 7-10).   Icy Hot 10-30 % Stck Apply 1 Application topically as needed (joint and muscle pain).   Icy Hot Lidocaine  Plus Menthol  4-1 % Ptch Generic drug: Lidocaine -Menthol  Apply 1-3 patches topically as needed (muscle and joint paint).   methocarbamol  500 MG tablet Commonly known as: ROBAXIN  1 tablet every 6 hours as needed for muscle spasms and neck pain   Nexplanon  68 MG Impl implant Generic drug: etonogestrel  1 each by Subdermal route once.   One-A-Day Womens Formula Tabs Take 1 tablet by mouth in the morning.   Tiger Balm Extra Strength 11-10 % Oint Apply 1 Application topically daily as needed (joint and muscle pain).        Follow-up Information     Gillie Duncans, MD Follow up in 3 week(s).   Specialty: Neurosurgery Why: please call the office to  make an appointment Contact information: [address] [place] [address] [phone]                 Signed: Rockey Peru 07/16/2024, 7:46 PM

## 2024-07-16 NOTE — Transfer of Care (Signed)
 Immediate Anesthesia Transfer of Care Note  Patient: Sharon Nash  Procedure(s) Performed: ANTERIOR CERVICAL DECOMPRESSION/DISCECTOMY FUSION CERVICAL SIX-SEVEN  Patient Location: PACU  Anesthesia Type:General  Level of Consciousness: drowsy and patient cooperative  Airway & Oxygen  Therapy: Patient Spontanous Breathing and Patient connected to face mask oxygen   Post-op Assessment: Report given to RN, Post -op Vital signs reviewed and stable, Patient moving all extremities, and Patient moving all extremities X 4  Post vital signs: Reviewed and stable  Last Vitals:  Vitals Value Taken Time  BP 143/59 07/16/24 17:30  Temp    Pulse 108 07/16/24 17:33  Resp 19 07/16/24 17:33  SpO2 97 % 07/16/24 17:33  Vitals shown include unfiled device data.  Last Pain:  Vitals:   07/16/24 1347  TempSrc: Oral  PainSc:       Patients Stated Pain Goal: 0 (07/13/24 2317)  Complications: No notable events documented.

## 2024-07-16 NOTE — Progress Notes (Signed)
 SLP Cancellation Note  Patient Details Name: Sharon Nash MRN: [id number] DOB: [birth year]   Cancelled treatment:       Reason Eval/Treat Not Completed: Patient at procedure or test/unavailable   Almeta Geisel, Consuelo Fitch 07/16/2024, 9:51 AM

## 2024-07-16 NOTE — Anesthesia Procedure Notes (Signed)
 Procedure Name: Intubation Date/Time: 07/16/2024 3:19 PM  Performed by: Arvell Edsel HERO, CRNAPre-anesthesia Checklist: Patient identified, Emergency Drugs available, Suction available, Patient being monitored and Timeout performed Patient Re-evaluated:Patient Re-evaluated prior to induction Oxygen  Delivery Method: Circle system utilized Preoxygenation: Pre-oxygenation with 100% oxygen  Induction Type: IV induction Ventilation: Mask ventilation without difficulty and Oral airway inserted - appropriate to patient size Laryngoscope Size: Glidescope and 3 Grade View: Grade I Tube size: 7.0 mm Number of attempts: 1 Airway Equipment and Method: Video-laryngoscopy Placement Confirmation: ETT inserted through vocal cords under direct vision, breath sounds checked- equal and bilateral and positive ETCO2 Secured at: 23 cm Tube secured with: Tape Dental Injury: Teeth and Oropharynx as per pre-operative assessment  Comments: Head and neck remained in line and neutral position

## 2024-07-17 MED ORDER — HYDROCODONE-ACETAMINOPHEN 5-325 MG PO TABS: 1.0000 | ORAL_TABLET | Freq: Four times a day (QID) | ORAL | 0 refills | Status: AC | PRN

## 2024-07-17 MED ORDER — METHOCARBAMOL 500 MG PO TABS: ORAL_TABLET | ORAL | 2 refills | Status: DC

## 2024-07-17 NOTE — Progress Notes (Signed)
 POD1 s/p C6-7 ACDF. Overall doing well Ok to discharge. DC orders in place Norco script re-routed to different pharmacy

## 2024-07-17 NOTE — Progress Notes (Signed)
 Order to discharge patient home. Family at bedside. Discharge instructions/AVS given to and reviewed with patient. Education provided as needed. Patient verbalized understanding. 2 PIVS removed by this RN. Personal belongings sent home with the patient. Home via private vehicle.
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
## 2024-07-17 NOTE — Anesthesia Postprocedure Evaluation (Signed)
 Anesthesia Post Note  Patient: Sharon Nash  Procedure(s) Performed: ANTERIOR CERVICAL DECOMPRESSION/DISCECTOMY FUSION CERVICAL SIX-SEVEN     Patient location during evaluation: PACU Anesthesia Type: General Level of consciousness: awake Pain management: pain level controlled Vital Signs Assessment: post-procedure vital signs reviewed and stable Respiratory status: spontaneous breathing, nonlabored ventilation and respiratory function stable Cardiovascular status: blood pressure returned to baseline and stable Postop Assessment: no apparent nausea or vomiting Anesthetic complications: no   No notable events documented.                Delon Aisha Arch

## 2024-07-17 NOTE — Progress Notes (Signed)
 PT Cancellation Note  Patient Details Name: Sharon Nash MRN: [id number] DOB: [birth year]   Cancelled Treatment:    Reason Eval/Treat Not Completed: Pain limiting ability to participate  Pt reporting pain 10/10 with RN present and about to give pain medications. Will return after pain medication has a chance to take effect.    Sharon RAMAN, PT Acute Rehabilitation Services  Office [phone]  Sharon Nash 07/17/2024, 8:35 AM

## 2024-07-19 ENCOUNTER — Encounter (HOSPITAL_COMMUNITY): Payer: Self-pay | Admitting: Neurosurgery

## 2024-07-19 MED FILL — Thrombin For Soln 5000 Unit: CUTANEOUS | Qty: 2 | Status: AC

## 2024-09-23 ENCOUNTER — Ambulatory Visit (INDEPENDENT_AMBULATORY_CARE_PROVIDER_SITE_OTHER)

## 2024-09-23 ENCOUNTER — Ambulatory Visit
Admission: EM | Admit: 2024-09-23 | Discharge: 2024-09-23 | Disposition: A | Discharge status: Home / Self Care | Attending: Nurse Practitioner | Admitting: Nurse Practitioner

## 2024-09-23 DIAGNOSIS — Z760 Encounter for issue of repeat prescription: Secondary | ICD-10-CM | POA: Diagnosis not present

## 2024-09-23 DIAGNOSIS — M79644 Pain in right finger(s): Secondary | ICD-10-CM | POA: Diagnosis not present

## 2024-09-23 DIAGNOSIS — M546 Pain in thoracic spine: Secondary | ICD-10-CM

## 2024-09-23 DIAGNOSIS — J4541 Moderate persistent asthma with (acute) exacerbation: Secondary | ICD-10-CM | POA: Diagnosis not present

## 2024-09-23 DIAGNOSIS — R0789 Other chest pain: Secondary | ICD-10-CM | POA: Diagnosis not present

## 2024-09-23 MED ORDER — ALBUTEROL SULFATE (2.5 MG/3ML) 0.083% IN NEBU: 2.5000 mg | INHALATION_SOLUTION | Freq: Four times a day (QID) | RESPIRATORY_TRACT | 0 refills | Status: DC | PRN

## 2024-09-23 MED ORDER — ALBUTEROL SULFATE HFA 108 (90 BASE) MCG/ACT IN AERS: 2.0000 | INHALATION_SPRAY | Freq: Four times a day (QID) | RESPIRATORY_TRACT | 0 refills | Status: DC | PRN

## 2024-09-23 MED ORDER — ALBUTEROL SULFATE HFA 108 (90 BASE) MCG/ACT IN AERS
2.0000 | INHALATION_SPRAY | Freq: Once | RESPIRATORY_TRACT | Status: AC
  Administered 2024-09-23: 2 via RESPIRATORY_TRACT

## 2024-09-23 MED ORDER — KETOROLAC TROMETHAMINE 30 MG/ML IJ SOLN
30.0000 mg | Freq: Once | INTRAMUSCULAR | Status: AC
  Administered 2024-09-23: 30 mg via INTRAMUSCULAR

## 2024-09-23 MED ORDER — TIZANIDINE HCL 4 MG PO TABS: 4.0000 mg | ORAL_TABLET | Freq: Three times a day (TID) | ORAL | 0 refills | Status: AC | PRN

## 2024-09-23 MED ORDER — BUDESONIDE-FORMOTEROL FUMARATE 160-4.5 MCG/ACT IN AERO: 2.0000 | INHALATION_SPRAY | Freq: Two times a day (BID) | RESPIRATORY_TRACT | 0 refills | Status: DC

## 2024-09-23 NOTE — Discharge Instructions (Addendum)
 Xrays are negative today for broken or fractured bones.  Continue Tylenol  or ibuprofen  as needed for pain.  You can also take the tizanidine  as needed for muscular pain.  Follow up with Neurosurgery regarding ongoing pain.  Refills have been sent to the pharmacy for your asthma.  Please follow up with a PCP for future refills.

## 2024-09-23 NOTE — ED Triage Notes (Signed)
 Pt present back and chest pain, symptoms started over two months ago from [facility] in November. Pt also would like a medication refill  Inhaler for her asthma.
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
## 2024-10-03 ENCOUNTER — Emergency Department (HOSPITAL_COMMUNITY)

## 2024-10-03 ENCOUNTER — Other Ambulatory Visit: Payer: Self-pay

## 2024-10-03 ENCOUNTER — Inpatient Hospital Stay (HOSPITAL_COMMUNITY)
Admission: EM | Admit: 2024-10-03 | Discharge: 2024-10-06 | DRG: 189 | Disposition: A | Discharge status: Home / Self Care | Attending: Family Medicine | Admitting: Family Medicine

## 2024-10-03 ENCOUNTER — Encounter (HOSPITAL_COMMUNITY): Payer: Self-pay

## 2024-10-03 DIAGNOSIS — R062 Wheezing: Secondary | ICD-10-CM

## 2024-10-03 DIAGNOSIS — Z8249 Family history of ischemic heart disease and other diseases of the circulatory system: Secondary | ICD-10-CM

## 2024-10-03 DIAGNOSIS — E66811 Obesity, class 1: Secondary | ICD-10-CM | POA: Diagnosis present

## 2024-10-03 DIAGNOSIS — J9601 Acute respiratory failure with hypoxia: Principal | ICD-10-CM | POA: Diagnosis present

## 2024-10-03 DIAGNOSIS — D649 Anemia, unspecified: Secondary | ICD-10-CM | POA: Diagnosis present

## 2024-10-03 DIAGNOSIS — G8929 Other chronic pain: Secondary | ICD-10-CM | POA: Diagnosis present

## 2024-10-03 DIAGNOSIS — Z6835 Body mass index (BMI) 35.0-35.9, adult: Secondary | ICD-10-CM

## 2024-10-03 DIAGNOSIS — E669 Obesity, unspecified: Secondary | ICD-10-CM | POA: Diagnosis present

## 2024-10-03 DIAGNOSIS — J45901 Unspecified asthma with (acute) exacerbation: Secondary | ICD-10-CM | POA: Diagnosis present

## 2024-10-03 DIAGNOSIS — Z803 Family history of malignant neoplasm of breast: Secondary | ICD-10-CM

## 2024-10-03 DIAGNOSIS — J4542 Moderate persistent asthma with status asthmaticus: Secondary | ICD-10-CM | POA: Diagnosis present

## 2024-10-03 DIAGNOSIS — Z7951 Long term (current) use of inhaled steroids: Secondary | ICD-10-CM

## 2024-10-03 DIAGNOSIS — K219 Gastro-esophageal reflux disease without esophagitis: Secondary | ICD-10-CM | POA: Diagnosis present

## 2024-10-03 DIAGNOSIS — Z833 Family history of diabetes mellitus: Secondary | ICD-10-CM

## 2024-10-03 DIAGNOSIS — Z1152 Encounter for screening for COVID-19: Secondary | ICD-10-CM

## 2024-10-03 DIAGNOSIS — F17200 Nicotine dependence, unspecified, uncomplicated: Secondary | ICD-10-CM | POA: Diagnosis present

## 2024-10-03 DIAGNOSIS — Z825 Family history of asthma and other chronic lower respiratory diseases: Secondary | ICD-10-CM

## 2024-10-03 DIAGNOSIS — Z91013 Allergy to seafood: Secondary | ICD-10-CM

## 2024-10-03 DIAGNOSIS — F419 Anxiety disorder, unspecified: Secondary | ICD-10-CM | POA: Diagnosis present

## 2024-10-03 DIAGNOSIS — Z888 Allergy status to other drugs, medicaments and biological substances status: Secondary | ICD-10-CM

## 2024-10-03 DIAGNOSIS — J4541 Moderate persistent asthma with (acute) exacerbation: Principal | ICD-10-CM

## 2024-10-03 LAB — CBC
HCT: 39 % (ref 36.0–46.0)
Hemoglobin: 11.9 g/dL — ABNORMAL LOW (ref 12.0–15.0)
MCH: 28.3 pg (ref 26.0–34.0)
MCHC: 30.5 g/dL (ref 30.0–36.0)
MCV: 92.9 fL (ref 80.0–100.0)
Platelets: 379 10*3/uL (ref 150–400)
RBC: 4.2 MIL/uL (ref 3.87–5.11)
RDW: 13.2 % (ref 11.5–15.5)
WBC: 9 10*3/uL (ref 4.0–10.5)
nRBC: 0 % (ref 0.0–0.2)

## 2024-10-03 LAB — COMPREHENSIVE METABOLIC PANEL WITH GFR
ALT: 5 U/L (ref 0–44)
AST: 14 U/L — ABNORMAL LOW (ref 15–41)
Albumin: 4.1 g/dL (ref 3.5–5.0)
Alkaline Phosphatase: 74 U/L (ref 38–126)
Anion gap: 15 (ref 5–15)
BUN: 8 mg/dL (ref 6–20)
CO2: 19 mmol/L — ABNORMAL LOW (ref 22–32)
Calcium: 9.1 mg/dL (ref 8.9–10.3)
Chloride: 107 mmol/L (ref 98–111)
Creatinine, Ser: 0.61 mg/dL (ref 0.44–1.00)
GFR, Estimated: >60 mL/min
Glucose, Bld: 121 mg/dL — ABNORMAL HIGH (ref 70–99)
Potassium: 3.5 mmol/L (ref 3.5–5.1)
Sodium: 141 mmol/L (ref 135–145)
Total Bilirubin: <0.2 mg/dL (ref 0.0–1.2)
Total Protein: 7.5 g/dL (ref 6.5–8.1)

## 2024-10-03 LAB — I-STAT CHEM 8, ED
BUN: 8 mg/dL (ref 6–20)
Calcium, Ion: 1.16 mmol/L (ref 1.15–1.40)
Chloride: 108 mmol/L (ref 98–111)
Creatinine, Ser: 0.7 mg/dL (ref 0.44–1.00)
Glucose, Bld: 155 mg/dL — ABNORMAL HIGH (ref 70–99)
HCT: 38 % (ref 36.0–46.0)
Hemoglobin: 12.9 g/dL (ref 12.0–15.0)
Potassium: 4.1 mmol/L (ref 3.5–5.1)
Sodium: 140 mmol/L (ref 135–145)
TCO2: 24 mmol/L (ref 22–32)

## 2024-10-03 LAB — RESP PANEL BY RT-PCR (RSV, FLU A&B, COVID)  RVPGX2
Influenza A by PCR: NEGATIVE
Influenza B by PCR: NEGATIVE
Resp Syncytial Virus by PCR: NEGATIVE
SARS Coronavirus 2 by RT PCR: NEGATIVE

## 2024-10-03 MED ORDER — IPRATROPIUM BROMIDE 0.02 % IN SOLN
0.5000 mg | Freq: Once | RESPIRATORY_TRACT | Status: AC
  Administered 2024-10-03: 0.5 mg via RESPIRATORY_TRACT
  Filled 2024-10-03: qty 2.5

## 2024-10-03 MED ORDER — LORAZEPAM 2 MG/ML IJ SOLN
0.5000 mg | Freq: Once | INTRAMUSCULAR | Status: AC
  Administered 2024-10-03: 0.5 mg via INTRAVENOUS
  Filled 2024-10-03: qty 1

## 2024-10-03 MED ORDER — ALBUTEROL SULFATE (2.5 MG/3ML) 0.083% IN NEBU
15.0000 mg/h | INHALATION_SOLUTION | RESPIRATORY_TRACT | Status: DC
  Administered 2024-10-03: 15 mg/h via RESPIRATORY_TRACT
  Filled 2024-10-03: qty 12

## 2024-10-03 MED ORDER — ACETAMINOPHEN 500 MG PO TABS
1000.0000 mg | ORAL_TABLET | Freq: Once | ORAL | Status: AC
  Administered 2024-10-03: 1000 mg via ORAL
  Filled 2024-10-03: qty 2

## 2024-10-03 MED ORDER — ALBUTEROL SULFATE (2.5 MG/3ML) 0.083% IN NEBU
10.0000 mg/h | INHALATION_SOLUTION | RESPIRATORY_TRACT | Status: DC
  Administered 2024-10-03: 10 mg/h via RESPIRATORY_TRACT

## 2024-10-03 MED ORDER — ONDANSETRON HCL 4 MG/2ML IJ SOLN
4.0000 mg | Freq: Once | INTRAMUSCULAR | Status: AC
  Administered 2024-10-03: 4 mg via INTRAVENOUS
  Filled 2024-10-03: qty 2

## 2024-10-03 MED ORDER — ALBUTEROL SULFATE (2.5 MG/3ML) 0.083% IN NEBU
INHALATION_SOLUTION | RESPIRATORY_TRACT | Status: AC
  Filled 2024-10-03: qty 12

## 2024-10-03 MED ORDER — ALBUTEROL (5 MG/ML) CONTINUOUS INHALATION SOLN
10.0000 mg/h | INHALATION_SOLUTION | RESPIRATORY_TRACT | Status: DC
  Administered 2024-10-03: 10 mg/h via RESPIRATORY_TRACT
  Filled 2024-10-03: qty 20

## 2024-10-03 MED ORDER — IPRATROPIUM BROMIDE 0.02 % IN SOLN
RESPIRATORY_TRACT | Status: AC
  Filled 2024-10-03: qty 2.5

## 2024-10-03 MED ORDER — ALBUTEROL SULFATE (2.5 MG/3ML) 0.083% IN NEBU
5.0000 mg | INHALATION_SOLUTION | Freq: Once | RESPIRATORY_TRACT | Status: AC
  Administered 2024-10-03: 5 mg via RESPIRATORY_TRACT
  Filled 2024-10-03: qty 6

## 2024-10-03 NOTE — ED Notes (Signed)
 Pt reports decreased nausea at this time. Aerosol mask placed back on pt. Pt calmer & less diaphoretic then upon arrival.

## 2024-10-03 NOTE — ED Provider Notes (Addendum)
 " [place] [facility] AT [facility] Provider Note   CSN: [id number] Arrival date & time: 10/03/24  1846     Patient presents with: Respiratory Distress   Sharon Nash is a 29 y.o. female.   Patient with hx asthma, with c/o increased wheezing/sob. Pt limited historian due to severe sob. Ems indicates gave albuterol  treatment, solumedrol iv, and is receiving 2 gm MG iv. Occasional non prod cough. No fever. No chest pain. No leg pain or swelling.   The history is provided by the patient, medical records and the EMS personnel. The history is limited by the condition of the patient.       Prior to Admission medications  Medication Sig Start Date End Date Taking? Authorizing Provider  acetaminophen  (TYLENOL ) 500 MG tablet Take 1,000 mg by mouth every 6 (six) hours as needed for mild pain (pain score 1-3) or headache.    [provider]  albuterol  (PROVENTIL ) (2.5 MG/3ML) 0.083% nebulizer solution Take 3 mLs (2.5 mg total) by nebulization every 6 (six) hours as needed for wheezing or shortness of breath. 09/23/24   Chandra Harlene LABOR, NP  albuterol  (VENTOLIN  HFA) 108 (90 Base) MCG/ACT inhaler Inhale 2 puffs into the lungs every 6 (six) hours as needed. 09/23/24   Chandra Harlene LABOR, NP  Biotin w/ Vitamins C & E (HAIR SKIN & NAILS GUMMIES PO) Take 2 each by mouth in the morning. Chew two gummies by mouth daily in the morning    [provider]  budesonide -formoterol  (SYMBICORT ) 160-4.5 MCG/ACT inhaler Inhale 2 puffs into the lungs in the morning and at bedtime. 09/23/24   Chandra Harlene LABOR, NP  cetirizine  (ZYRTEC ) 10 MG tablet Take 10 mg by mouth daily as needed for allergies or rhinitis.    [provider]  Cholecalciferol  25 MCG (1000 UT) capsule Take 1,000 Units by mouth in the morning.    [provider]  etonogestrel  (NEXPLANON ) 68 MG IMPL implant 1 each by Subdermal route once.    [provider]  fluticasone   (FLONASE ) 50 MCG/ACT nasal spray Place 2 sprays into both nostrils daily. 02/02/23   Leath-Warren, Etta PARAS, NP  HYDROcodone -acetaminophen  (NORCO/VICODIN) 5-325 MG tablet Take 1 tablet by mouth every 6 (six) hours as needed for severe pain (pain score 7-10). 07/17/24   Darnella Dorn SAUNDERS, MD  Lidocaine -Menthol  (ICY HOT LIDOCAINE  PLUS MENTHOL ) 4-1 % PTCH Apply 1-3 patches topically as needed (muscle and joint paint).    [provider]  Menthol -Camphor (TIGER BALM EXTRA STRENGTH) 11-10 % OINT Apply 1 Application topically daily as needed (joint and muscle pain).    [provider]  Menthol -Methyl Salicylate (ICY HOT) 10-30 % STCK Apply 1 Application topically as needed (joint and muscle pain).    [provider]  methocarbamol  (ROBAXIN ) 500 MG tablet 1 tablet every 6 hours as needed for muscle spasms and neck pain 07/17/24   Darnella Dorn SAUNDERS, MD  Multiple Vitamins-Calcium (ONE-A-DAY WOMENS FORMULA) TABS Take 1 tablet by mouth in the morning.    [provider]  tiZANidine  (ZANAFLEX ) 4 MG tablet Take 1 tablet (4 mg total) by mouth every 8 (eight) hours as needed for muscle spasms. Do not take with alcohol or while driving or operating heavy machinery.  May cause drowsiness. 09/23/24   Chandra Harlene LABOR, NP    Allergies: Shrimp [shellfish allergy] and Sertraline     Review of Systems  Constitutional:  Negative for fever.  Respiratory:  Positive for shortness of breath  and wheezing.   Cardiovascular:  Negative for chest pain.  Gastrointestinal:  Negative for abdominal pain and vomiting.  Genitourinary:  Negative for flank pain.  Musculoskeletal:  Negative for neck pain and neck stiffness.  Skin:  Negative for rash.  Neurological:  Negative for headaches.    Updated Vital Signs BP 123/63   Pulse (!) 105   Temp (!) 97.2 F (36.2 C) (Axillary)   Resp 16   LMP 09/23/2024 Comment: Pt started cycle 4 days ago, she is currently still on her cycle.  SpO2 100%    Physical Exam Vitals and nursing note reviewed.  Constitutional:      General: She is in acute distress.     Appearance: She is well-developed.  HENT:     Head: Atraumatic.     Nose: Nose normal.     Mouth/Throat:     Mouth: Mucous membranes are moist.     Pharynx: Oropharynx is clear.  Eyes:     General: No scleral icterus.    Conjunctiva/sclera: Conjunctivae normal.     Pupils: Pupils are equal, round, and reactive to light.  Neck:     Trachea: No tracheal deviation.     Comments: Trachea midline. Thyroid not grossly enlarged or tender. No neck stiffness or rigidity.  Cardiovascular:     Rate and Rhythm: Normal rate and regular rhythm.     Pulses: Normal pulses.     Heart sounds: Normal heart sounds. No murmur heard.    No friction rub. No gallop.  Pulmonary:     Effort: Respiratory distress present.     Breath sounds: Wheezing present.  Abdominal:     General: There is no distension.     Palpations: Abdomen is soft.     Tenderness: There is no abdominal tenderness.  Genitourinary:    Comments: No cva tenderness.  Musculoskeletal:        General: No swelling or tenderness.     Cervical back: Normal range of motion and neck supple. No rigidity. No muscular tenderness.     Right lower leg: No edema.     Left lower leg: No edema.  Lymphadenopathy:     Cervical: No cervical adenopathy.  Skin:    General: Skin is warm and dry.     Findings: No rash.  Neurological:     Mental Status: She is alert.     Comments: Alert, speech normal.   Psychiatric:        Mood and Affect: Mood normal.     (all labs ordered are listed, but only abnormal results are displayed) Results for orders placed or performed during the hospital encounter of 10/03/24  I-stat chem 8, ed   Collection Time: 10/03/24  6:56 PM  Result Value Ref Range   Sodium 140 135 - 145 mmol/L   Potassium 4.1 3.5 - 5.1 mmol/L   Chloride 108 98 - 111 mmol/L   BUN 8 6 - 20 mg/dL   Creatinine, Ser 9.29 0.44 -  1.00 mg/dL   Glucose, Bld 844 (H) 70 - 99 mg/dL   Calcium, Ion 8.83 8.84 - 1.40 mmol/L   TCO2 24 22 - 32 mmol/L   Hemoglobin 12.9 12.0 - 15.0 g/dL   HCT 61.9 63.9 - 53.9 %  Comprehensive metabolic panel with GFR   Collection Time: 10/03/24  7:10 PM  Result Value Ref Range   Sodium 141 135 - 145 mmol/L   Potassium 3.5 3.5 - 5.1 mmol/L   Chloride 107 98 -  111 mmol/L   CO2 19 (L) 22 - 32 mmol/L   Glucose, Bld 121 (H) 70 - 99 mg/dL   BUN 8 6 - 20 mg/dL   Creatinine, Ser 9.38 0.44 - 1.00 mg/dL   Calcium 9.1 8.9 - 89.6 mg/dL   Total Protein 7.5 6.5 - 8.1 g/dL   Albumin 4.1 3.5 - 5.0 g/dL   AST 14 (L) 15 - 41 U/L   ALT 5 0 - 44 U/L   Alkaline Phosphatase 74 38 - 126 U/L   Total Bilirubin <0.2 0.0 - 1.2 mg/dL   GFR, Estimated >39 >39 mL/min   Anion gap 15 5 - 15  CBC   Collection Time: 10/03/24  7:10 PM  Result Value Ref Range   WBC 9.0 4.0 - 10.5 K/uL   RBC 4.20 3.87 - 5.11 MIL/uL   Hemoglobin 11.9 (L) 12.0 - 15.0 g/dL   HCT 60.9 63.9 - 53.9 %   MCV 92.9 80.0 - 100.0 fL   MCH 28.3 26.0 - 34.0 pg   MCHC 30.5 30.0 - 36.0 g/dL   RDW 86.7 88.4 - 84.4 %   Platelets 379 150 - 400 K/uL   nRBC 0.0 0.0 - 0.2 %  Resp panel by RT-PCR (RSV, Flu A&B, Covid) Anterior Nasal Swab   Collection Time: 10/03/24  7:20 PM   Specimen: Anterior Nasal Swab  Result Value Ref Range   SARS Coronavirus 2 by RT PCR NEGATIVE NEGATIVE   Influenza A by PCR NEGATIVE NEGATIVE   Influenza B by PCR NEGATIVE NEGATIVE   Resp Syncytial Virus by PCR NEGATIVE NEGATIVE   DG Chest Port 1 View Result Date: 10/03/2024 EXAM: 1 VIEW(S) XRAY OF THE CHEST 10/03/2024 07:50:48 PM COMPARISON: None available. CLINICAL HISTORY: Shortness of breath. FINDINGS: LUNGS AND PLEURA: No focal pulmonary opacity. No pleural effusion. No pneumothorax. HEART AND MEDIASTINUM: No acute abnormality of the cardiac and mediastinal silhouettes. BONES AND SOFT TISSUES: No acute osseous abnormality. IMPRESSION: 1. No acute cardiopulmonary process.  Electronically signed by: Oneil Devonshire MD 10/03/2024 07:54 PM EST RP Workstation: [id number]   DG Thoracic Spine 2 View Result Date: 09/23/2024 CLINICAL DATA:  Back pain. EXAM: THORACIC SPINE 2 VIEWS COMPARISON:  Radiograph dated 04/12/2010. FINDINGS: There is no evidence of thoracic spine fracture. Alignment is normal. No other significant bone abnormalities are identified. IMPRESSION: Negative. Electronically Signed   By: Vanetta Chou M.D.   On: 09/23/2024 14:46   DG Finger Thumb Right Result Date: 09/23/2024 CLINICAL DATA:  Thumb pain. EXAM: RIGHT THUMB 2+V COMPARISON:  None Available. FINDINGS: There is no evidence of fracture or dislocation. There is no evidence of arthropathy or other focal bone abnormality. Soft tissues are unremarkable. IMPRESSION: Negative. Electronically Signed   By: Vanetta Chou M.D.   On: 09/23/2024 14:45     EKG: None  Radiology: [facility] Chest Port 1 View Result Date: 10/03/2024 EXAM: 1 VIEW(S) XRAY OF THE CHEST 10/03/2024 07:50:48 PM COMPARISON: None available. CLINICAL HISTORY: Shortness of breath. FINDINGS: LUNGS AND PLEURA: No focal pulmonary opacity. No pleural effusion. No pneumothorax. HEART AND MEDIASTINUM: No acute abnormality of the cardiac and mediastinal silhouettes. BONES AND SOFT TISSUES: No acute osseous abnormality. IMPRESSION: 1. No acute cardiopulmonary process. Electronically signed by: Oneil Devonshire MD 10/03/2024 07:54 PM EST RP Workstation: [id number]     Procedures   Medications Ordered in the ED  albuterol  (PROVENTIL ) (2.5 MG/3ML) 0.083% nebulizer solution (  Not Given 10/03/24 1858)  ipratropium (ATROVENT ) 0.02 % nebulizer solution (  Canceled  Entry 10/03/24 1938)  albuterol  (PROVENTIL ) (2.5 MG/3ML) 0.083% nebulizer solution (  Canceled Entry 10/03/24 1937)  ipratropium (ATROVENT ) nebulizer solution 0.5 mg (0.5 mg Nebulization Given 10/03/24 1853)  ondansetron  (ZOFRAN ) injection 4 mg (4 mg Intravenous Given 10/03/24 1922)                                     Medical Decision Making Problems Addressed: Acute respiratory failure with hypoxia [facility]): acute illness or injury with systemic symptoms that poses a threat to life or bodily functions Moderate persistent asthma with exacerbation: acute illness or injury with systemic symptoms that poses a threat to life or bodily functions Moderate persistent asthma with status asthmaticus: acute illness or injury with systemic symptoms that poses a threat to life or bodily functions Wheezing: acute illness or injury  Amount and/or Complexity of Data Reviewed Independent Historian: EMS    Details: hx External Data Reviewed: notes. Labs: ordered. Decision-making details documented in ED Course. Radiology: ordered and independent interpretation performed. Decision-making details documented in ED Course. ECG/medicine tests: ordered and independent interpretation performed. Decision-making details documented in ED Course. Discussion of management or test interpretation with external provider(s): medicine  Risk OTC drugs. Prescription drug management. Decision regarding hospitalization.   Iv ns. Continuous pulse ox and cardiac monitoring. Labs ordered/sent. Imaging ordered.   Differential diagnosis includes asthma exacerbation, pna, uri, etc. Dispo decision including potential need for admission considered - will get labs and imaging and reassess.   Reviewed nursing notes and prior charts for additional history. External reports reviewed. Additional history from: EMS.   Continuous albuterol  and atrovent  neb.   Cardiac monitor: sinus rhythm, rate 100.  Labs reviewed/interpreted by me - wbc and hct normal. Chem largely unremarkable.   Xrays reviewed/interpreted by me - no pna.   Recheck improved air movement, persistent diffuse wheezing. Additional albuterol  and atrovent  neb.   Hospitalists consulted for admission. Discussed pt - will admit  CRITICAL CARE RE: acute asthma  exacerbation/status asthmaticus, continuous albuterol  nebs.  Performed by: Chey Rachels E Rosio Weiss Total critical care time: 45 minutes Critical care time was exclusive of separately billable procedures and treating other patients. Critical care was necessary to treat or prevent imminent or life-threatening deterioration. Critical care was time spent personally by me on the following activities: development of treatment plan with patient and/or surrogate as well as nursing, discussions with consultants, evaluation of patient's response to treatment, examination of patient, obtaining history from patient or surrogate, ordering and performing treatments and interventions, ordering and review of laboratory studies, ordering and review of radiographic studies, pulse oximetry and re-evaluation of patient's condition.       Final diagnoses:  None    ED Discharge Orders     None          Bernard Drivers, MD 10/03/24 2046  "

## 2024-10-03 NOTE — ED Provider Notes (Signed)
 Care assumed at shift change. History of severe asthma, prior intubations, awaiting admission. Has had nebs, steroids and magnesium .  Physical Exam  BP 139/72   Pulse (!) 120   Temp (!) 97.4 F (36.3 C) (Axillary)   Resp 18   LMP 09/23/2024 Comment: Pt started cycle 4 days ago, she is currently still on her cycle.  SpO2 99%   Physical Exam  Procedures  .Critical Care  Performed by: Roselyn Carlin NOVAK, MD Authorized by: Roselyn Carlin NOVAK, MD   Critical care provider statement:    Critical care time (minutes):  80   Critical care time was exclusive of:  Separately billable procedures and treating other patients   Critical care was necessary to treat or prevent imminent or life-threatening deterioration of the following conditions:  Respiratory failure   Critical care was time spent personally by me on the following activities:  Development of treatment plan with patient or surrogate, discussions with consultants, evaluation of patient's response to treatment, examination of patient, ordering and review of laboratory studies, ordering and review of radiographic studies, ordering and performing treatments and interventions, pulse oximetry, re-evaluation of patient's condition and review of old charts   I assumed direction of critical care for this patient from another provider in my specialty: yes     Care discussed with: admitting provider     ED Course / MDM   Clinical Course as of 10/04/24 0112  [place] Oct 03, 2024  2139 Patient seen by Hospitalist, still with increased WOB and wheezing. Will remain in ED pending 4 hour CAT. May consider BiPAP if WOB worsens.  [CS]  2156 Patient feels like some of her symptoms were anxiety driven while finishing up prior 1hour CAT. Will give a small dose of ativan  for anxiolysis.  [CS]  2350 Patient now on BiPAP and appears more comfortable. Still having some wheezing but improved WOB. Will reconsult Hospitalist for admission.  [CS]  Mon Oct 04, 2024   0045 Patient has finished neb, still having wheezing but no significant change in WOB, RR or SpO2. Will give another dose of magnesium .  [CS]  0110 Spoke with Blondie, NP Hospitalist who will evaluate for admission.  [CS]    Clinical Course User Index [CS] Roselyn Carlin NOVAK, MD   Medical Decision Making Problems Addressed: Acute respiratory failure with hypoxia [facility]): acute illness or injury Moderate persistent asthma with exacerbation: acute illness or injury Moderate persistent asthma with status asthmaticus: acute illness or injury  Risk OTC drugs. Prescription drug management. Decision regarding hospitalization.       Roselyn Carlin NOVAK, MD 10/04/24 [phone]

## 2024-10-03 NOTE — H&P (Incomplete)
 " History and Physical    Patient: Sharon Nash FMW:[id number] DOB: [birth year] DOA: 10/03/2024 DOS: the patient was seen and examined on 10/03/2024 PCP: Patient, No Pcp Per  Patient coming from: Home  Chief Complaint:  Chief Complaint  Patient presents with   Respiratory Distress   HPI: Sharon Nash is a 29 y.o. female with medical history significant of ***       Review of Systems: As mentioned in the history of present illness. All other systems reviewed and are negative. Past Medical History:  Diagnosis Date   Asthma    Chronic abdominal pain    Chronic knee pain    Respiratory failure, acute (HCC) 06/09/2012   bipap only, not intubated, due to asthma exacerbation   Past Surgical History:  Procedure Laterality Date   ANTERIOR CERVICAL DECOMP/DISCECTOMY FUSION N/A 07/16/2024   Procedure: ANTERIOR CERVICAL DECOMPRESSION/DISCECTOMY FUSION CERVICAL SIX-SEVEN;  Surgeon: Gillie Duncans, MD;  Location: [facility] OR;  Service: Neurosurgery;  Laterality: N/A;  ACDF C6-7   NO PAST SURGERIES     None     Social History:  reports that she has never smoked. She has never used smokeless tobacco. She reports that she does not currently use alcohol. She reports current drug use. Drug: Marijuana.  Allergies[1]  Family History  Problem Relation Age of Onset   Diabetes Maternal Grandmother    Hypertension Maternal Grandmother    Diabetes Maternal Grandfather    Asthma Maternal Grandfather    Asthma Sister    Breast cancer Maternal Aunt    Colon cancer Neg Hx     Prior to Admission medications  Medication Sig Start Date End Date Taking? Authorizing Provider  acetaminophen  (TYLENOL ) 500 MG tablet Take 1,000 mg by mouth every 6 (six) hours as needed for mild pain (pain score 1-3) or headache.    [provider]  albuterol  (PROVENTIL ) (2.5 MG/3ML) 0.083% nebulizer solution Take 3 mLs (2.5 mg total) by nebulization every 6 (six) hours as needed for wheezing or shortness of  breath. 09/23/24   Chandra Harlene LABOR, NP  albuterol  (VENTOLIN  HFA) 108 (90 Base) MCG/ACT inhaler Inhale 2 puffs into the lungs every 6 (six) hours as needed. 09/23/24   Chandra Harlene LABOR, NP  Biotin w/ Vitamins C & E (HAIR SKIN & NAILS GUMMIES PO) Take 2 each by mouth in the morning. Chew two gummies by mouth daily in the morning    [provider]  budesonide -formoterol  (SYMBICORT ) 160-4.5 MCG/ACT inhaler Inhale 2 puffs into the lungs in the morning and at bedtime. 09/23/24   Chandra Harlene LABOR, NP  cetirizine  (ZYRTEC ) 10 MG tablet Take 10 mg by mouth daily as needed for allergies or rhinitis.    [provider]  Cholecalciferol  25 MCG (1000 UT) capsule Take 1,000 Units by mouth in the morning.    [provider]  etonogestrel  (NEXPLANON ) 68 MG IMPL implant 1 each by Subdermal route once.    [provider]  fluticasone  (FLONASE ) 50 MCG/ACT nasal spray Place 2 sprays into both nostrils daily. 02/02/23   Leath-Warren, Etta PARAS, NP  HYDROcodone -acetaminophen  (NORCO/VICODIN) 5-325 MG tablet Take 1 tablet by mouth every 6 (six) hours as needed for severe pain (pain score 7-10). 07/17/24   Darnella Dorn SAUNDERS, MD  Lidocaine -Menthol  (ICY HOT LIDOCAINE  PLUS MENTHOL ) 4-1 % PTCH Apply 1-3 patches topically as needed (muscle and joint paint).    [provider]  Menthol -Camphor (TIGER BALM EXTRA STRENGTH) 11-10 % OINT Apply 1 Application topically daily  as needed (joint and muscle pain).    [provider]  Menthol -Methyl Salicylate (ICY HOT) 10-30 % STCK Apply 1 Application topically as needed (joint and muscle pain).    [provider]  methocarbamol  (ROBAXIN ) 500 MG tablet 1 tablet every 6 hours as needed for muscle spasms and neck pain 07/17/24   Darnella Dorn SAUNDERS, MD  Multiple Vitamins-Calcium (ONE-A-DAY WOMENS FORMULA) TABS Take 1 tablet by mouth in the morning.    [provider]  tiZANidine  (ZANAFLEX ) 4 MG tablet Take 1 tablet (4 mg  total) by mouth every 8 (eight) hours as needed for muscle spasms. Do not take with alcohol or while driving or operating heavy machinery.  May cause drowsiness. 09/23/24   Chandra Harlene LABOR, NP    Physical Exam: Vitals:   10/03/24 1938 10/03/24 1945 10/03/24 2000 10/03/24 2015  BP: 128/74 (!) 145/85 123/63 (!) 162/77  Pulse: (!) 102 (!) 108 (!) 105 (!) 118  Resp: 20 16    Temp:      TempSrc:      SpO2: 100% 100% 100% 100%   *** Data Reviewed: {Tip this will not be part of the note when signed- Document your independent interpretation of telemetry tracing, EKG, lab, Radiology test or any other diagnostic tests. Add any new diagnostic test ordered today. (Optional):26781} Results for orders placed or performed during the hospital encounter of 10/03/24 (from the past 24 hours)  I-stat chem 8, ed     Status: Abnormal   Collection Time: 10/03/24  6:56 PM  Result Value Ref Range   Sodium 140 135 - 145 mmol/L   Potassium 4.1 3.5 - 5.1 mmol/L   Chloride 108 98 - 111 mmol/L   BUN 8 6 - 20 mg/dL   Creatinine, Ser 9.29 0.44 - 1.00 mg/dL   Glucose, Bld 844 (H) 70 - 99 mg/dL   Calcium, Ion 8.83 8.84 - 1.40 mmol/L   TCO2 24 22 - 32 mmol/L   Hemoglobin 12.9 12.0 - 15.0 g/dL   HCT 61.9 63.9 - 53.9 %  Comprehensive metabolic panel with GFR     Status: Abnormal   Collection Time: 10/03/24  7:10 PM  Result Value Ref Range   Sodium 141 135 - 145 mmol/L   Potassium 3.5 3.5 - 5.1 mmol/L   Chloride 107 98 - 111 mmol/L   CO2 19 (L) 22 - 32 mmol/L   Glucose, Bld 121 (H) 70 - 99 mg/dL   BUN 8 6 - 20 mg/dL   Creatinine, Ser 9.38 0.44 - 1.00 mg/dL   Calcium 9.1 8.9 - 89.6 mg/dL   Total Protein 7.5 6.5 - 8.1 g/dL   Albumin 4.1 3.5 - 5.0 g/dL   AST 14 (L) 15 - 41 U/L   ALT 5 0 - 44 U/L   Alkaline Phosphatase 74 38 - 126 U/L   Total Bilirubin <0.2 0.0 - 1.2 mg/dL   GFR, Estimated >39 >39 mL/min   Anion gap 15 5 - 15  CBC     Status: Abnormal   Collection Time: 10/03/24  7:10 PM  Result Value  Ref Range   WBC 9.0 4.0 - 10.5 K/uL   RBC 4.20 3.87 - 5.11 MIL/uL   Hemoglobin 11.9 (L) 12.0 - 15.0 g/dL   HCT 60.9 63.9 - 53.9 %   MCV 92.9 80.0 - 100.0 fL   MCH 28.3 26.0 - 34.0 pg   MCHC 30.5 30.0 - 36.0 g/dL   RDW 86.7 88.4 - 84.4 %  Platelets 379 150 - 400 K/uL   nRBC 0.0 0.0 - 0.2 %  Resp panel by RT-PCR (RSV, Flu A&B, Covid) Anterior Nasal Swab     Status: None   Collection Time: 10/03/24  7:20 PM   Specimen: Anterior Nasal Swab  Result Value Ref Range   SARS Coronavirus 2 by RT PCR NEGATIVE NEGATIVE   Influenza A by PCR NEGATIVE NEGATIVE   Influenza B by PCR NEGATIVE NEGATIVE   Resp Syncytial Virus by PCR NEGATIVE NEGATIVE     Assessment and Plan: No notes have been filed under this hospital service. Service: Hospitalist     Advance Care Planning:   Code Status: Prior ***  Consults: none  Family Communication: ***  Severity of Illness: {Observation/Inpatient:21159}  Author: ARTHEA CHILD, MD 10/03/2024 8:48 PM  For on call review [email or web address].     [1]  Allergies Allergen Reactions   Shrimp [Shellfish Allergy] Anaphylaxis   Sertraline  Other (See Comments)    Having bad dreams   "

## 2024-10-03 NOTE — ED Notes (Signed)
 ED Provider at bedside.

## 2024-10-03 NOTE — ED Triage Notes (Signed)
 Pt via [facility] called out for respiratory distress. Pt with hx of asthma and pt took 6 breathing treatments at home- no relief. Pt has received7 1/2 albuterol , 1 mg Atrovent  and 125 solu in route- mag just started. Pt with audible wheezing. EDP and respiratory at bedside.

## 2024-10-03 NOTE — ED Notes (Signed)
 Pt states she still feels like she is working too hard to get air & asked to try bipap. EDP made aware, agreed to trial on bipap. Respiratory made aware.

## 2024-10-03 NOTE — ED Notes (Signed)
 Pt states the current mask isn't working as well. States she understands she is getting the same amount of oxygen  & increased medication but states she feels like she is having to work harder to breath. Pt agreed to see how she feels after a few minutes to see if her WOB increases or maintains.

## 2024-10-03 NOTE — ED Provider Notes (Incomplete Revision)
 Care assumed at shift change. History of severe asthma, prior intubations, awaiting admission. Has had nebs, steroids and magnesium .  Physical Exam  BP (!) 145/72   Pulse (!) 130   Temp (!) 97.2 F (36.2 C) (Axillary)   Resp 16   LMP 09/23/2024 Comment: Pt started cycle 4 days ago, she is currently still on her cycle.  SpO2 100%   Physical Exam  Procedures  .Critical Care  Performed by: Roselyn Carlin NOVAK, MD Authorized by: Roselyn Carlin NOVAK, MD   Critical care provider statement:    Critical care time (minutes):  45   Critical care time was exclusive of:  Separately billable procedures and treating other patients   Critical care was necessary to treat or prevent imminent or life-threatening deterioration of the following conditions:  Respiratory failure   Critical care was time spent personally by me on the following activities:  Development of treatment plan with patient or surrogate, discussions with consultants, evaluation of patient's response to treatment, examination of patient, ordering and review of laboratory studies, ordering and review of radiographic studies, ordering and performing treatments and interventions, pulse oximetry, re-evaluation of patient's condition and review of old charts   I assumed direction of critical care for this patient from another provider in my specialty: yes     Care discussed with: admitting provider     ED Course / MDM   Clinical Course as of 10/03/24 2141  [place] Oct 03, 2024  2139 Patient seen by Hospitalist, still with increased WOB and wheezing. Will remain in ED pending 4 hour CAT. May consider BiPAP if WOB worsens.  [CS]    Clinical Course User Index [CS] Roselyn Carlin NOVAK, MD   Medical Decision Making Amount and/or Complexity of Data Reviewed Labs: ordered. Radiology: ordered.  Risk OTC drugs. Prescription drug management. Decision regarding hospitalization.

## 2024-10-03 NOTE — ED Notes (Signed)
 Pt was calm & appeared more relax. As neb tx was ending pt became more restless & called for assistance. Pt states that her chest is starting to become tight again. Respiratory therapist Harlene called & is en route to pt room for administration of new neb tx ordered. Pt much more anxious appearing than 10 minutes ago, asking when respiratory is going to be at bedside. Oxygen  saturation remains 100% on aerosol mask.

## 2024-10-03 NOTE — ED Notes (Signed)
 Dr Arthea at bedside to exam pt for admission. Discussed 4 hour long neb treatment d/t pt WOB still labored. Dr Arthea requested respiratory be called for new orders. Respiratory therapist Harlene called & notified of providers request. Harlene RT states she is en route to pt's bedside.

## 2024-10-04 DIAGNOSIS — J4531 Mild persistent asthma with (acute) exacerbation: Secondary | ICD-10-CM

## 2024-10-04 DIAGNOSIS — E669 Obesity, unspecified: Secondary | ICD-10-CM | POA: Diagnosis present

## 2024-10-04 LAB — MRSA NEXT GEN BY PCR, NASAL: MRSA by PCR Next Gen: NOT DETECTED

## 2024-10-04 LAB — COMPREHENSIVE METABOLIC PANEL WITH GFR
ALT: 6 U/L (ref 0–44)
AST: 20 U/L (ref 15–41)
Albumin: 4.2 g/dL (ref 3.5–5.0)
Alkaline Phosphatase: 79 U/L (ref 38–126)
Anion gap: 19 — ABNORMAL HIGH (ref 5–15)
BUN: 9 mg/dL (ref 6–20)
CO2: 18 mmol/L — ABNORMAL LOW (ref 22–32)
Calcium: 9.5 mg/dL (ref 8.9–10.3)
Chloride: 104 mmol/L (ref 98–111)
Creatinine, Ser: 0.78 mg/dL (ref 0.44–1.00)
GFR, Estimated: >60 mL/min
Glucose, Bld: 167 mg/dL — ABNORMAL HIGH (ref 70–99)
Potassium: 3.9 mmol/L (ref 3.5–5.1)
Sodium: 141 mmol/L (ref 135–145)
Total Bilirubin: 0.3 mg/dL (ref 0.0–1.2)
Total Protein: 7.6 g/dL (ref 6.5–8.1)

## 2024-10-04 MED ORDER — IPRATROPIUM-ALBUTEROL 0.5-2.5 (3) MG/3ML IN SOLN
3.0000 mL | Freq: Four times a day (QID) | RESPIRATORY_TRACT | Status: DC
  Administered 2024-10-04 – 2024-10-06 (×10): 3 mL via RESPIRATORY_TRACT
  Filled 2024-10-04 (×9): qty 3

## 2024-10-04 MED ORDER — ACETAMINOPHEN 325 MG PO TABS
650.0000 mg | ORAL_TABLET | Freq: Four times a day (QID) | ORAL | Status: DC | PRN
  Administered 2024-10-04 (×2): 650 mg via ORAL
  Filled 2024-10-04 (×2): qty 2

## 2024-10-04 MED ORDER — ENOXAPARIN SODIUM 40 MG/0.4ML IJ SOSY
40.0000 mg | PREFILLED_SYRINGE | INTRAMUSCULAR | Status: DC
  Administered 2024-10-04 – 2024-10-06 (×3): 40 mg via SUBCUTANEOUS
  Filled 2024-10-04 (×3): qty 0.4

## 2024-10-04 MED ORDER — SODIUM CHLORIDE 0.9% FLUSH
3.0000 mL | Freq: Two times a day (BID) | INTRAVENOUS | Status: DC
  Administered 2024-10-05 – 2024-10-06 (×3): 3 mL via INTRAVENOUS

## 2024-10-04 MED ORDER — ALBUTEROL SULFATE (2.5 MG/3ML) 0.083% IN NEBU
2.5000 mg | INHALATION_SOLUTION | RESPIRATORY_TRACT | Status: DC | PRN
  Administered 2024-10-04 – 2024-10-05 (×5): 2.5 mg via RESPIRATORY_TRACT
  Filled 2024-10-04 (×6): qty 3

## 2024-10-04 MED ORDER — NICOTINE 21 MG/24HR TD PT24
21.0000 mg | MEDICATED_PATCH | Freq: Every day | TRANSDERMAL | Status: DC
  Filled 2024-10-04 (×3): qty 1

## 2024-10-04 MED ORDER — TRAZODONE HCL 50 MG PO TABS
50.0000 mg | ORAL_TABLET | Freq: Every evening | ORAL | Status: DC | PRN
  Administered 2024-10-04: 50 mg via ORAL
  Filled 2024-10-04: qty 1

## 2024-10-04 MED ORDER — ONDANSETRON HCL 4 MG/2ML IJ SOLN
4.0000 mg | Freq: Four times a day (QID) | INTRAMUSCULAR | Status: DC | PRN
  Administered 2024-10-04: 4 mg via INTRAVENOUS
  Filled 2024-10-04: qty 2

## 2024-10-04 MED ORDER — METHOCARBAMOL 500 MG PO TABS
500.0000 mg | ORAL_TABLET | Freq: Three times a day (TID) | ORAL | Status: DC
  Administered 2024-10-04 – 2024-10-06 (×8): 500 mg via ORAL
  Filled 2024-10-04 (×8): qty 1

## 2024-10-04 MED ORDER — METHYLPREDNISOLONE SODIUM SUCC 40 MG IJ SOLR
40.0000 mg | Freq: Two times a day (BID) | INTRAMUSCULAR | Status: DC
  Administered 2024-10-04 – 2024-10-06 (×4): 40 mg via INTRAVENOUS
  Filled 2024-10-04 (×4): qty 1

## 2024-10-04 MED ORDER — METHYLPREDNISOLONE SODIUM SUCC 125 MG IJ SOLR
125.0000 mg | Freq: Once | INTRAMUSCULAR | Status: AC
  Administered 2024-10-04: 125 mg via INTRAVENOUS
  Filled 2024-10-04: qty 2

## 2024-10-04 MED ORDER — FLUTICASONE FUROATE-VILANTEROL 200-25 MCG/ACT IN AEPB
1.0000 | INHALATION_SPRAY | Freq: Every day | RESPIRATORY_TRACT | Status: DC
  Administered 2024-10-05: 1 via RESPIRATORY_TRACT
  Filled 2024-10-04: qty 28

## 2024-10-04 MED ORDER — SODIUM CHLORIDE 0.9% FLUSH: 3.0000 mL | INTRAVENOUS | Status: DC | PRN

## 2024-10-04 MED ORDER — CHLORHEXIDINE GLUCONATE CLOTH 2 % EX PADS
6.0000 | MEDICATED_PAD | Freq: Every day | CUTANEOUS | Status: DC
  Administered 2024-10-05: 6 via TOPICAL

## 2024-10-04 MED ORDER — PANTOPRAZOLE SODIUM 40 MG PO TBEC
40.0000 mg | DELAYED_RELEASE_TABLET | Freq: Every day | ORAL | Status: DC
  Administered 2024-10-04 – 2024-10-06 (×3): 40 mg via ORAL
  Filled 2024-10-04 (×4): qty 1

## 2024-10-04 MED ORDER — BISACODYL 10 MG RE SUPP: 10.0000 mg | Freq: Every day | RECTAL | Status: DC | PRN

## 2024-10-04 MED ORDER — IPRATROPIUM-ALBUTEROL 0.5-2.5 (3) MG/3ML IN SOLN
3.0000 mL | RESPIRATORY_TRACT | Status: DC | PRN
  Administered 2024-10-04: 3 mL via RESPIRATORY_TRACT
  Filled 2024-10-04: qty 3

## 2024-10-04 MED ORDER — SODIUM CHLORIDE 0.9% FLUSH
3.0000 mL | Freq: Two times a day (BID) | INTRAVENOUS | Status: DC
  Administered 2024-10-04: 3 mL via INTRAVENOUS

## 2024-10-04 MED ORDER — POLYETHYLENE GLYCOL 3350 17 G PO PACK: 17.0000 g | PACK | Freq: Every day | ORAL | Status: DC | PRN

## 2024-10-04 MED ORDER — ONDANSETRON HCL 4 MG PO TABS: 4.0000 mg | ORAL_TABLET | Freq: Four times a day (QID) | ORAL | Status: DC | PRN

## 2024-10-04 MED ORDER — SODIUM CHLORIDE 0.9 % IV SOLN: INTRAVENOUS | Status: AC | PRN

## 2024-10-04 MED ORDER — MAGNESIUM SULFATE 2 GM/50ML IV SOLN
2.0000 g | Freq: Once | INTRAVENOUS | Status: AC
  Administered 2024-10-04: 2 g via INTRAVENOUS
  Filled 2024-10-04: qty 50

## 2024-10-04 MED ORDER — SODIUM CHLORIDE 0.9% FLUSH
3.0000 mL | Freq: Two times a day (BID) | INTRAVENOUS | Status: DC
  Administered 2024-10-04 – 2024-10-06 (×4): 3 mL via INTRAVENOUS

## 2024-10-04 NOTE — Progress Notes (Signed)
 Pt does not have PCP. Will add PCP list to AVS.    10/04/24 0810  TOC Brief Assessment  Insurance and Status Reviewed  Patient has primary care physician No (Will add PCP list to AVS.)  Home environment has been reviewed Lives alone.  Prior level of function: Independent.  Prior/Current Home Services No current home services  Social Drivers of Health Review SDOH reviewed no interventions necessary  Readmission risk has been reviewed Yes  Transition of care needs no transition of care needs at this time

## 2024-10-04 NOTE — ED Notes (Signed)
 Pt 1 assisted to the [facility] and back to the bed with minimal assistance

## 2024-10-04 NOTE — Progress Notes (Signed)
 RT placed patient back on BIPAP per patient request. Patient resting comfortably at this time.

## 2024-10-04 NOTE — ED Notes (Signed)
 Family updated as to patient's status.

## 2024-10-04 NOTE — ED Notes (Signed)
 Return call to pt's mother requesting an update, call went straight to voicemail and is full.

## 2024-10-04 NOTE — ED Notes (Signed)
 Pt requesting to be placed back on bipap by respiratory states when she falls asleep feels like she cannot breath.

## 2024-10-04 NOTE — Progress Notes (Addendum)
 " PROGRESS NOTE     Sharon Nash, is a 29 y.o. female, DOB - 1996-02-14, FMW:[id number]  Admit date - 10/03/2024   Admitting Physician Posey Maier, DO  Outpatient Primary MD for the patient is Patient, No Pcp Per  LOS - 0  Chief Complaint  Patient presents with   Respiratory Distress        Brief Narrative:  29 year old female with history of mild persistent asthma (remote history of prior intubation many years ago), ongoing tobacco use and obesity admitted on 10/04/2024 with asthma flare up -- Required BiPAP and oxygen  supplementation initially    -Assessment and Plan: 1)Acute Asthma exacerbation--- required BiPAP initially Cough and wheezing persist -Weaned down to 5 L of oxygen  by nasal cannula - Tachycardia and tachypnea persist - Add IV Solu-Medrol  -Continue bronchodilators  2)Acute hypoxic respiratory failure--- due to #1 above - Management as above #1  3)Tobacco Abuse--smoking cessation advised - Use nicotine  patch  4)Class 1 Obesity- -Low calorie diet, portion control and increase physical activity discussed with patient -Body mass index is 33.67 kg/m.  5)GERD--- Protonix  as ordered especially while on high-dose steroids  Patient seen and evaluated, chart reviewed, please see EMR for updated orders. Please see full H&P dictated by admitting physician Dr. Adefeso for same date of service.   Total care time 48 minutes  Status is: Inpatient   Disposition: The patient is from: Home              Anticipated d/c is to: Home              Anticipated d/c date is: 2 days              Patient currently is not medically stable to d/c. Barriers: Not Clinically Stable-   Code Status : -  Code Status: Full Code   Family Communication:    NA (patient is alert, awake and coherent)   DVT Prophylaxis  :   - SCDs   SCDs Start: 10/04/24 1033 Place TED hose Start: 10/04/24 1033 enoxaparin  (LOVENOX ) injection 40 mg Start: 10/04/24 1000   Lab Results  Component  Value Date   PLT 379 10/03/2024    Inpatient Medications  Scheduled Meds:  enoxaparin  (LOVENOX ) injection  40 mg Subcutaneous Q24H   fluticasone  furoate-vilanterol  1 puff Inhalation Daily   ipratropium-albuterol   3 mL Nebulization Q6H   methocarbamol   500 mg Oral TID   methylPREDNISolone  (SOLU-MEDROL ) injection  40 mg Intravenous Q12H   sodium chloride  flush  3 mL Intravenous Q12H   sodium chloride  flush  3 mL Intravenous Q12H   Continuous Infusions:  sodium chloride      PRN Meds:.sodium chloride , acetaminophen , albuterol , bisacodyl , ondansetron  **OR** ondansetron  (ZOFRAN ) IV, polyethylene glycol, sodium chloride  flush, traZODone    Anti-infectives (From admission, onward)    None       Subjective: Sharon Nash today has no fevers, no emesis,  No chest pain,   - Cough and wheezing persist -Weaned down to 5 L of oxygen  by nasal cannula - Tachycardia and tachypnea persist  Objective: Vitals:   10/04/24 1322 10/04/24 1330 10/04/24 1345 10/04/24 1347  BP:  (!) 146/76    Pulse: (!) 115 (!) 105 100 89  Resp: 20 17 17 15   Temp:      TempSrc:      SpO2: 99% 100% 100% 99%  Weight:      Height:        Intake/Output Summary (Last 24 hours) at 10/04/2024 1521 Last data  filed at 10/04/2024 0825 Gross per 24 hour  Intake 291 ml  Output 800 ml  Net -509 ml   Filed Weights   10/04/24 0654  Weight: 97.5 kg    Physical Exam Gen:- Awake Alert, +Ve conversational dyspnea HEENT:- [place].AT, No sclera icterus Nose- [place] 5L/min Neck-Supple Neck,No JVD,.  Lungs-diminished breath sounds with scattered wheezes bilaterally  CV- S1, S2 normal, regular  Abd-  +ve B.Sounds, Abd Soft, No tenderness, increased truncal adiposity Extremity/Skin:- No  edema, pedal pulses present  Psych-affect is appropriate, oriented x3 Neuro-no new focal deficits, no tremors  Data Reviewed: I have personally reviewed following labs and imaging studies  CBC: Recent Labs  Lab 10/03/24 1856  10/03/24 1910  WBC  --  9.0  HGB 12.9 11.9*  HCT 38.0 39.0  MCV  --  92.9  PLT  --  379   Basic Metabolic Panel: Recent Labs  Lab 10/03/24 1856 10/03/24 1910 10/04/24 0412  NA 140 141 141  K 4.1 3.5 3.9  CL 108 107 104  CO2  --  19* 18*  GLUCOSE 155* 121* 167*  BUN 8 8 9   CREATININE 0.70 0.61 0.78  CALCIUM  --  9.1 9.5   GFR: Estimated Creatinine Clearance: 124.5 mL/min (by C-G formula based on SCr of 0.78 mg/dL). Liver Function Tests: Recent Labs  Lab 10/03/24 1910 10/04/24 0412  AST 14* 20  ALT 5 6  ALKPHOS 74 79  BILITOT <0.2 0.3  PROT 7.5 7.6  ALBUMIN 4.1 4.2     Recent Results (from the past 240 hours)  Resp panel by RT-PCR (RSV, Flu A&B, Covid) Anterior Nasal Swab     Status: None   Collection Time: 10/03/24  7:20 PM   Specimen: Anterior Nasal Swab  Result Value Ref Range Status   SARS Coronavirus 2 by RT PCR NEGATIVE NEGATIVE Final    Comment: (NOTE) SARS-CoV-2 target nucleic acids are NOT DETECTED.  The SARS-CoV-2 RNA is generally detectable in upper respiratory specimens during the acute phase of infection. The lowest concentration of SARS-CoV-2 viral copies this assay can detect is 138 copies/mL. A negative result does not preclude SARS-Cov-2 infection and should not be used as the sole basis for treatment or other patient management decisions. A negative result may occur with  improper specimen collection/handling, submission of specimen other than nasopharyngeal swab, presence of viral mutation(s) within the areas targeted by this assay, and inadequate number of viral copies(<138 copies/mL). A negative result must be combined with clinical observations, patient history, and epidemiological information. The expected result is Negative.  Fact Sheet for Patients:  bloggercourse.com  Fact Sheet for Healthcare Providers:  seriousbroker.it  This test is no t yet approved or cleared by the [place]  [place] FDA and  has been authorized for detection and/or diagnosis of SARS-CoV-2 by FDA under an Emergency Use Authorization (EUA). This EUA will remain  in effect (meaning this test can be used) for the duration of the COVID-19 declaration under Section 564(b)(1) of the Act, 21 U.S.C.section 360bbb-3(b)(1), unless the authorization is terminated  or revoked sooner.       Influenza A by PCR NEGATIVE NEGATIVE Final   Influenza B by PCR NEGATIVE NEGATIVE Final    Comment: (NOTE) The Xpert Xpress SARS-CoV-2/FLU/RSV plus assay is intended as an aid in the diagnosis of influenza from Nasopharyngeal swab specimens and should not be used as a sole basis for treatment. Nasal washings and aspirates are unacceptable for Xpert Xpress SARS-CoV-2/FLU/RSV testing.  Fact Sheet  for Patients: bloggercourse.com  Fact Sheet for Healthcare Providers: seriousbroker.it  This test is not yet approved or cleared by the [place]  FDA and has been authorized for detection and/or diagnosis of SARS-CoV-2 by FDA under an Emergency Use Authorization (EUA). This EUA will remain in effect (meaning this test can be used) for the duration of the COVID-19 declaration under Section 564(b)(1) of the Act, 21 U.S.C. section 360bbb-3(b)(1), unless the authorization is terminated or revoked.     Resp Syncytial Virus by PCR NEGATIVE NEGATIVE Final    Comment: (NOTE) Fact Sheet for Patients: bloggercourse.com  Fact Sheet for Healthcare Providers: seriousbroker.it  This test is not yet approved or cleared by the [place]  FDA and has been authorized for detection and/or diagnosis of SARS-CoV-2 by FDA under an Emergency Use Authorization (EUA). This EUA will remain in effect (meaning this test can be used) for the duration of the COVID-19 declaration under Section 564(b)(1) of the Act, 21 U.S.C. section  360bbb-3(b)(1), unless the authorization is terminated or revoked.  Performed at [facility], [address] [place], [place] [address]     Radiology Studies: [facility] Chest [facility] 1 View Result Date: 10/03/2024 EXAM: 1 VIEW(S) XRAY OF THE CHEST 10/03/2024 07:50:48 PM COMPARISON: None available. CLINICAL HISTORY: Shortness of breath. FINDINGS: LUNGS AND PLEURA: No focal pulmonary opacity. No pleural effusion. No pneumothorax. HEART AND MEDIASTINUM: No acute abnormality of the cardiac and mediastinal silhouettes. BONES AND SOFT TISSUES: No acute osseous abnormality. IMPRESSION: 1. No acute cardiopulmonary process. Electronically signed by: Oneil Devonshire MD 10/03/2024 07:54 PM EST RP Workstation: [id number]   Scheduled Meds:  enoxaparin  (LOVENOX ) injection  40 mg Subcutaneous Q24H   fluticasone  furoate-vilanterol  1 puff Inhalation Daily   ipratropium-albuterol   3 mL Nebulization Q6H   methocarbamol   500 mg Oral TID   methylPREDNISolone  (SOLU-MEDROL ) injection  40 mg Intravenous Q12H   sodium chloride  flush  3 mL Intravenous Q12H   sodium chloride  flush  3 mL Intravenous Q12H   Continuous Infusions:  sodium chloride       LOS: 0 days   Rendall Carwin M.D on 10/04/2024 at 3:21 PM  Go to [email or web address] - for contact info  [facility] - Office  [phone]  If 7PM-7AM, please contact night-coverage [email or web address] 10/04/2024, 3:21 PM    "

## 2024-10-04 NOTE — H&P (Signed)
 " History and Physical  Sharon Nash  FMW:[id number]  DOB: [birth year]  DOA: 10/03/2024  Referring physician:  Dr Carlin Gula ([facility]) PCP: Patient, No Pcp Per  Patient coming from: Home   Chief Complaint: Asthma Exacerbation/Respiratory distress  HPI: Sharon Nash is a 29 y.o. female with medical history significant for asthma, chronic abdominal pain, previous intubation (many years ago per patient).  She states that cold exacerbates her asthma, and she feels that this was the trigger on this occasion. However she has been inside mostly and during the onset of this episode. Onset was sudden and her attempts to control it were not effective.  ED Course: Patient was given multiple albuterol  treatments of varying lengths from 1-4 hour continuous ( while with EMS and in ER), Solu-Medrol , and Magnesium  IV also given. Patient is currently on BiPAP in the ER resting with some slight residual wheeze.  Review of Systems: Review of systems as noted in the HPI. All other systems reviewed and are negative.   Past Medical History:  Diagnosis Date   Asthma    Chronic abdominal pain    Chronic knee pain    Respiratory failure, acute (HCC) 06/09/2012   bipap only, not intubated, due to asthma exacerbation   Past Surgical History:  Procedure Laterality Date   ANTERIOR CERVICAL DECOMP/DISCECTOMY FUSION N/A 07/16/2024   Procedure: ANTERIOR CERVICAL DECOMPRESSION/DISCECTOMY FUSION CERVICAL SIX-SEVEN;  Surgeon: Gillie Duncans, MD;  Location: [facility] OR;  Service: Neurosurgery;  Laterality: N/A;  ACDF C6-7   NO PAST SURGERIES     None      Social History:  reports that she has never smoked. She has never used smokeless tobacco. She reports that she does not currently use alcohol. She reports current drug use. Drug: Marijuana.   Allergies[1]  Family History  Problem Relation Age of Onset   Diabetes Maternal Grandmother    Hypertension Maternal Grandmother    Diabetes Maternal  Grandfather    Asthma Maternal Grandfather    Asthma Sister    Breast cancer Maternal Aunt    Colon cancer Neg Hx      Prior to Admission medications  Medication Sig Start Date End Date Taking? Authorizing Provider  acetaminophen  (TYLENOL ) 500 MG tablet Take 1,000 mg by mouth every 6 (six) hours as needed for mild pain (pain score 1-3) or headache.    [provider]  albuterol  (PROVENTIL ) (2.5 MG/3ML) 0.083% nebulizer solution Take 3 mLs (2.5 mg total) by nebulization every 6 (six) hours as needed for wheezing or shortness of breath. 09/23/24   Chandra Harlene LABOR, NP  albuterol  (VENTOLIN  HFA) 108 (90 Base) MCG/ACT inhaler Inhale 2 puffs into the lungs every 6 (six) hours as needed. 09/23/24   Chandra Harlene LABOR, NP  Biotin w/ Vitamins C & E (HAIR SKIN & NAILS GUMMIES PO) Take 2 each by mouth in the morning. Chew two gummies by mouth daily in the morning    [provider]  budesonide -formoterol  (SYMBICORT ) 160-4.5 MCG/ACT inhaler Inhale 2 puffs into the lungs in the morning and at bedtime. 09/23/24   Chandra Harlene LABOR, NP  cetirizine  (ZYRTEC ) 10 MG tablet Take 10 mg by mouth daily as needed for allergies or rhinitis.    [provider]  Cholecalciferol  25 MCG (1000 UT) capsule Take 1,000 Units by mouth in the morning.    [provider]  etonogestrel  (NEXPLANON ) 68 MG IMPL implant 1 each by Subdermal route once.    [provider]  fluticasone  (  FLONASE ) 50 MCG/ACT nasal spray Place 2 sprays into both nostrils daily. 02/02/23   Leath-Warren, Etta PARAS, NP  HYDROcodone -acetaminophen  (NORCO/VICODIN) 5-325 MG tablet Take 1 tablet by mouth every 6 (six) hours as needed for severe pain (pain score 7-10). 07/17/24   Darnella Dorn SAUNDERS, MD  Lidocaine -Menthol  (ICY HOT LIDOCAINE  PLUS MENTHOL ) 4-1 % PTCH Apply 1-3 patches topically as needed (muscle and joint paint).    [provider]  Menthol -Camphor (TIGER BALM EXTRA STRENGTH) 11-10 % OINT Apply 1  Application topically daily as needed (joint and muscle pain).    [provider]  Menthol -Methyl Salicylate (ICY HOT) 10-30 % STCK Apply 1 Application topically as needed (joint and muscle pain).    [provider]  methocarbamol  (ROBAXIN ) 500 MG tablet 1 tablet every 6 hours as needed for muscle spasms and neck pain 07/17/24   Darnella Dorn SAUNDERS, MD  Multiple Vitamins-Calcium (ONE-A-DAY WOMENS FORMULA) TABS Take 1 tablet by mouth in the morning.    [provider]  tiZANidine  (ZANAFLEX ) 4 MG tablet Take 1 tablet (4 mg total) by mouth every 8 (eight) hours as needed for muscle spasms. Do not take with alcohol or while driving or operating heavy machinery.  May cause drowsiness. 09/23/24   Chandra Harlene LABOR, NP    Physical Exam: BP 139/72   Pulse (!) 120   Temp (!) 97.4 F (36.3 C) (Axillary)   Resp 18   LMP 09/23/2024 Comment: Pt started cycle 4 days ago, she is currently still on her cycle.  SpO2 99%   General: 29 y.o. year-old female well developed well nourished in no acute distress.  Alert and oriented x3. HEENT: NCAT, EOMI Neck: Supple, trachea medial Cardiovascular: Regular rate and rhythm with no rubs or gallops.  No thyromegaly or JVD noted.  No lower extremity edema. 2/4 pulses in all 4 extremities. Respiratory: Slight wheeze. On BiPAP. Abdomen: Soft, nontender nondistended with normal bowel sounds x4 quadrants. Muskuloskeletal: No cyanosis, clubbing or edema noted bilaterally Neuro: CN II-XII intact, strength 5/5 x 4, sensation, reflexes intact Skin: No ulcerative lesions noted or rashes Psychiatry: Judgement and insight appear normal. Mood is appropriate for condition and setting.          Labs on Admission:  Basic Metabolic Panel: Recent Labs  Lab 10/03/24 1856 10/03/24 1910  NA 140 141  K 4.1 3.5  CL 108 107  CO2  --  19*  GLUCOSE 155* 121*  BUN 8 8  CREATININE 0.70 0.61  CALCIUM  --  9.1   Liver Function Tests: Recent Labs  Lab  10/03/24 1910  AST 14*  ALT 5  ALKPHOS 74  BILITOT <0.2  PROT 7.5  ALBUMIN 4.1   No results for input(s): LIPASE, AMYLASE in the last 168 hours. No results for input(s): AMMONIA in the last 168 hours. CBC: Recent Labs  Lab 10/03/24 1856 10/03/24 1910  WBC  --  9.0  HGB 12.9 11.9*  HCT 38.0 39.0  MCV  --  92.9  PLT  --  379   Cardiac Enzymes: No results for input(s): CKTOTAL, CKMB, CKMBINDEX, TROPONINI in the last 168 hours.  BNP (last 3 results) No results for input(s): BNP in the last 8760 hours.  ProBNP (last 3 results) No results for input(s): PROBNP in the last 8760 hours.  CBG: No results for input(s): GLUCAP in the last 168 hours.  Radiological Exams on Admission: DG Chest Port 1 View Result Date: 10/03/2024 EXAM: 1 VIEW(S) XRAY OF THE CHEST  10/03/2024 07:50:48 PM COMPARISON: None available. CLINICAL HISTORY: Shortness of breath. FINDINGS: LUNGS AND PLEURA: No focal pulmonary opacity. No pleural effusion. No pneumothorax. HEART AND MEDIASTINUM: No acute abnormality of the cardiac and mediastinal silhouettes. BONES AND SOFT TISSUES: No acute osseous abnormality. IMPRESSION: 1. No acute cardiopulmonary process. Electronically signed by: Oneil Devonshire MD 10/03/2024 07:54 PM EST RP Workstation: [id number]    EKG: I independently viewed the EKG done and my findings are as followed: Sinus Tachycardia  Assessment/Plan Present on Admission:  Asthma exacerbation  Principal Problem:   Asthma exacerbation As needed Nebulizer treatments As needed  BiPAP Maintain SPO2 of 92% Maintain room temperature and patient Temperature checks.  Anxiety Anti-anxiolytics as needed, keeping in mind Respiratory state.  Maintain SPO2 at 92% or greater.  Abdominal pain (chronic) As needed muscle spasm control As needed for abdominal pain.     DVT prophylaxis: Lovenox   Code Status: FULL CODE   Family Communication: None at bedside  Disposition Plan:  SDU  Consults called: NA   Admission status: Observation/Inpatient:21159    Sharon Nash BSN MSNA MSN ACNPC-AG Acute Care Nurse Practitioner [facility] [place]   10/04/2024, 1:25 AM      [1]  Allergies Allergen Reactions   Shrimp [Shellfish Allergy] Anaphylaxis   Sertraline  Other (See Comments)    Having bad dreams   "

## 2024-10-04 NOTE — Progress Notes (Signed)
 Patient taken off of BIPAP and placed on 5L nasal cannula. Patient is tolerating well at this time. BIPAP remains at bedside when needed. RN aware.

## 2024-10-04 NOTE — Discharge Instructions (Signed)
" °  Providers Accepting New Patients in [place], [place]    [facility] [address], [address]  [place], [place] [address] [phone] Accepts most insurances  [facility] [address] [place], [place] [address] [phone] Accepts most insurances  [facility] [address] [place], [place] [address]  [phone] Must meet requirements  [facility] [address] [place], [place] [address] [phone] Accepts most insurances  [facility] [address]  [place], [place] [address] [phone] Accepts most insurances  [facility] [address]   [place], [place]   [phone] Accepts most insurances  [facility])  [phone]  [place], [place] [address] [phone] Accepts most insurances     [facility] [address]  [place], [place] [address] [phone] Accepts most insurances  [facility] Department [address] [place], [place] [address] [phone] option 1 Accepts Medicaid and [facility] [address]  [place], [place] [address] [phone] Accepts most insurances  Benita Outhouse, MD [address] [place], [place] [address] [phone] Accepts most insurances  [facility] at [facility] [address]  [place], [place] [address] [phone] Accepts most insurances  [facility] [phone] [address] [place], [place] [address] [phone] Accepts most insurances  [place], [place] [address], [address] [place], [place] [address] [phone]  Accepts most insurances     1)Complete Abstinence from Tobacco advised--- okay to use over-the-counter nicotine  patch to help you quit smoking 2)Class II obesity- -Low calorie diet, portion control and increase physical activity discussed with patient -Body mass index is 35.94  kg/m.              "

## 2024-10-05 DIAGNOSIS — K219 Gastro-esophageal reflux disease without esophagitis: Secondary | ICD-10-CM

## 2024-10-05 DIAGNOSIS — E669 Obesity, unspecified: Secondary | ICD-10-CM

## 2024-10-05 DIAGNOSIS — J4531 Mild persistent asthma with (acute) exacerbation: Secondary | ICD-10-CM | POA: Diagnosis not present

## 2024-10-05 LAB — GLUCOSE, CAPILLARY: Glucose-Capillary: 169 mg/dL — ABNORMAL HIGH (ref 70–99)

## 2024-10-05 MED ORDER — TRAZODONE HCL 50 MG PO TABS
50.0000 mg | ORAL_TABLET | Freq: Every day | ORAL | Status: DC
  Administered 2024-10-05: 50 mg via ORAL
  Filled 2024-10-05: qty 1

## 2024-10-05 MED ORDER — HYDROXYZINE HCL 25 MG PO TABS
25.0000 mg | ORAL_TABLET | Freq: Three times a day (TID) | ORAL | Status: DC
  Administered 2024-10-05 – 2024-10-06 (×2): 25 mg via ORAL
  Filled 2024-10-05 (×2): qty 1

## 2024-10-05 MED ORDER — BUDESON-GLYCOPYRROL-FORMOTEROL 160-9-4.8 MCG/ACT IN AERO
2.0000 | INHALATION_SPRAY | Freq: Two times a day (BID) | RESPIRATORY_TRACT | Status: DC
  Administered 2024-10-06: 2 via RESPIRATORY_TRACT
  Filled 2024-10-05: qty 5.9

## 2024-10-05 NOTE — Plan of Care (Signed)
" °  Problem: Education: Goal: Knowledge of General Education information will improve Description: Including pain rating scale, medication(s)/side effects and non-pharmacologic comfort measures Outcome: Progressing   Problem: Health Behavior/Discharge Planning: Goal: Ability to manage health-related needs will improve Outcome: Progressing   Problem: Clinical Measurements: Goal: Ability to maintain clinical measurements within normal limits will improve Outcome: Progressing Goal: Will remain free from infection Outcome: Not Applicable Goal: Diagnostic test results will improve Outcome: Progressing Goal: Respiratory complications will improve Outcome: Progressing Goal: Cardiovascular complication will be avoided Outcome: Not Applicable   Problem: Activity: Goal: Risk for activity intolerance will decrease Outcome: Not Applicable   Problem: Nutrition: Goal: Adequate nutrition will be maintained Outcome: Not Applicable   Problem: Coping: Goal: Level of anxiety will decrease Outcome: Progressing   Problem: Elimination: Goal: Will not experience complications related to bowel motility Outcome: Not Applicable Goal: Will not experience complications related to urinary retention Outcome: Not Applicable   Problem: Pain Managment: Goal: General experience of comfort will improve and/or be controlled Outcome: Not Applicable   Problem: Safety: Goal: Ability to remain free from injury will improve Outcome: Progressing   Problem: Skin Integrity: Goal: Risk for impaired skin integrity will decrease Outcome: Not Applicable   "

## 2024-10-05 NOTE — Progress Notes (Signed)
 " PROGRESS NOTE  Sharon Nash, is a 29 y.o. female, DOB - [birth year], FMW:[id number]  Admit date - 10/03/2024   Admitting Physician Posey Maier, DO  Outpatient Primary MD for the patient is Patient, No Pcp Per  LOS - 1  Chief Complaint  Patient presents with   Respiratory Distress       Brief Narrative:  29 year old female with history of mild persistent asthma (remote history of prior intubation many years ago), ongoing tobacco use and obesity admitted on 10/04/2024 with asthma flare up -- Required BiPAP and oxygen  supplementation initially   -Assessment and Plan: 1)Acute Asthma exacerbation--- required BiPAP initially Cough and wheezing persist - Tachycardia , hypoxia and Tachypnea --resolving - Continue IV Solu-Medrol  -Continue bronchodilators  2)Acute hypoxic respiratory failure--- due to #1 above -Resolving - Management as above #1  3)Tobacco Abuse--smoking cessation advised - Use nicotine  patch  4)Class 1 Obesity- -Low calorie diet, portion control and increase physical activity discussed with patient -Body mass index is 35.94 kg/m.  5)GERD--- Protonix  as ordered especially while on high-dose steroids  Status is: Inpatient   Disposition: The patient is from: Home              Anticipated d/c is to: Home              Anticipated d/c date is: 1 day              Patient currently is not medically stable to d/c. Barriers: Not Clinically Stable-   Code Status : -  Code Status: Full Code   Family Communication:    NA (patient is alert, awake and coherent)   DVT Prophylaxis  :   - SCDs   SCDs Start: 10/04/24 1033 Place TED hose Start: 10/04/24 1033 enoxaparin  (LOVENOX ) injection 40 mg Start: 10/04/24 1000   Lab Results  Component Value Date   PLT 379 10/03/2024   Inpatient Medications  Scheduled Meds:  Chlorhexidine  Gluconate Cloth  6 each Topical Q0600   enoxaparin  (LOVENOX ) injection  40 mg Subcutaneous Q24H   fluticasone  furoate-vilanterol  1  puff Inhalation Daily   ipratropium-albuterol   3 mL Nebulization Q6H   methocarbamol   500 mg Oral TID   methylPREDNISolone  (SOLU-MEDROL ) injection  40 mg Intravenous Q12H   nicotine   21 mg Transdermal Daily   pantoprazole   40 mg Oral Daily   sodium chloride  flush  3 mL Intravenous Q12H   sodium chloride  flush  3 mL Intravenous Q12H   Continuous Infusions:   PRN Meds:.acetaminophen , albuterol , bisacodyl , ondansetron  **OR** ondansetron  (ZOFRAN ) IV, polyethylene glycol, sodium chloride  flush, traZODone    Anti-infectives (From admission, onward)    None       Subjective: Sharon Nash today has no fevers, no emesis,  No chest pain,   - Cough and wheezing improving - Tachycardia , hypoxia and tachypnea resolving  Objective: Vitals:   10/05/24 1000 10/05/24 1100 10/05/24 1227 10/05/24 1500  BP:      Pulse: 88 (!) 106    Resp: 18 19    Temp:   98.8 F (37.1 C) 98.4 F (36.9 C)  TempSrc:   Oral Oral  SpO2: 99% 97%    Weight:      Height:       No intake or output data in the 24 hours ending 10/05/24 1648  Filed Weights   10/04/24 0654 10/04/24 1847  Weight: 97.5 kg 104.1 kg    Physical Exam Gen:- Awake Alert, dyspnea on exertion noted no significant conversational dyspnea HEENT:-  [place].AT, No sclera icterus Nose- [place] 2L/min=----weaning off Neck-Supple Neck,No JVD,.  Lungs-improving air movement, less wheezy CV- S1, S2 normal, regular  Abd-  +ve B.Sounds, Abd Soft, No tenderness, increased truncal adiposity Extremity/Skin:- No  edema, pedal pulses present  Psych-affect is appropriate, oriented x3 Neuro-no new focal deficits, no tremors  Data Reviewed: I have personally reviewed following labs and imaging studies  CBC: Recent Labs  Lab 10/03/24 1856 10/03/24 1910  WBC  --  9.0  HGB 12.9 11.9*  HCT 38.0 39.0  MCV  --  92.9  PLT  --  379   Basic Metabolic Panel: Recent Labs  Lab 10/03/24 1856 10/03/24 1910 10/04/24 0412  NA 140 141 141  K 4.1 3.5 3.9   CL 108 107 104  CO2  --  19* 18*  GLUCOSE 155* 121* 167*  BUN 8 8 9   CREATININE 0.70 0.61 0.78  CALCIUM  --  9.1 9.5   GFR: Estimated Creatinine Clearance: 128.7 mL/min (by C-G formula based on SCr of 0.78 mg/dL). Liver Function Tests: Recent Labs  Lab 10/03/24 1910 10/04/24 0412  AST 14* 20  ALT 5 6  ALKPHOS 74 79  BILITOT <0.2 0.3  PROT 7.5 7.6  ALBUMIN 4.1 4.2     Recent Results (from the past 240 hours)  Resp panel by RT-PCR (RSV, Flu A&B, Covid) Anterior Nasal Swab     Status: None   Collection Time: 10/03/24  7:20 PM   Specimen: Anterior Nasal Swab  Result Value Ref Range Status   SARS Coronavirus 2 by RT PCR NEGATIVE NEGATIVE Final    Comment: (NOTE) SARS-CoV-2 target nucleic acids are NOT DETECTED.  The SARS-CoV-2 RNA is generally detectable in upper respiratory specimens during the acute phase of infection. The lowest concentration of SARS-CoV-2 viral copies this assay can detect is 138 copies/mL. A negative result does not preclude SARS-Cov-2 infection and should not be used as the sole basis for treatment or other patient management decisions. A negative result may occur with  improper specimen collection/handling, submission of specimen other than nasopharyngeal swab, presence of viral mutation(s) within the areas targeted by this assay, and inadequate number of viral copies(<138 copies/mL). A negative result must be combined with clinical observations, patient history, and epidemiological information. The expected result is Negative.  Fact Sheet for Patients:  bloggercourse.com  Fact Sheet for Healthcare Providers:  seriousbroker.it  This test is no t yet approved or cleared by the [place]  FDA and  has been authorized for detection and/or diagnosis of SARS-CoV-2 by FDA under an Emergency Use Authorization (EUA). This EUA will remain  in effect (meaning this test can be used) for the duration of  the COVID-19 declaration under Section 564(b)(1) of the Act, 21 U.S.C.section 360bbb-3(b)(1), unless the authorization is terminated  or revoked sooner.       Influenza A by PCR NEGATIVE NEGATIVE Final   Influenza B by PCR NEGATIVE NEGATIVE Final    Comment: (NOTE) The Xpert Xpress SARS-CoV-2/FLU/RSV plus assay is intended as an aid in the diagnosis of influenza from Nasopharyngeal swab specimens and should not be used as a sole basis for treatment. Nasal washings and aspirates are unacceptable for Xpert Xpress SARS-CoV-2/FLU/RSV testing.  Fact Sheet for Patients: bloggercourse.com  Fact Sheet for Healthcare Providers: seriousbroker.it  This test is not yet approved or cleared by the [place]  FDA and has been authorized for detection and/or diagnosis of SARS-CoV-2 by FDA under an Emergency Use Authorization (EUA). This EUA will remain  in effect (meaning this test can be used) for the duration of the COVID-19 declaration under Section 564(b)(1) of the Act, 21 U.S.C. section 360bbb-3(b)(1), unless the authorization is terminated or revoked.     Resp Syncytial Virus by PCR NEGATIVE NEGATIVE Final    Comment: (NOTE) Fact Sheet for Patients: bloggercourse.com  Fact Sheet for Healthcare Providers: seriousbroker.it  This test is not yet approved or cleared by the [place]  FDA and has been authorized for detection and/or diagnosis of SARS-CoV-2 by FDA under an Emergency Use Authorization (EUA). This EUA will remain in effect (meaning this test can be used) for the duration of the COVID-19 declaration under Section 564(b)(1) of the Act, 21 U.S.C. section 360bbb-3(b)(1), unless the authorization is terminated or revoked.  Performed at [facility], [address], [place], [place] [address]   MRSA Next Gen by PCR, Nasal     Status: None   Collection Time: 10/04/24  6:04  PM   Specimen: Nasal Mucosa; Nasal Swab  Result Value Ref Range Status   MRSA by PCR Next Gen NOT DETECTED NOT DETECTED Final    Comment: (NOTE) The GeneXpert MRSA Assay (FDA approved for NASAL specimens only), is one component of a comprehensive MRSA colonization surveillance program. It is not intended to diagnose MRSA infection nor to guide or monitor treatment for MRSA infections. Test performance is not FDA approved in patients less than 64 years old. Performed at [facility], [address], [place], [place] [address]     Radiology Studies: [facility] 1 View Result Date: 10/03/2024 EXAM: 1 VIEW(S) XRAY OF THE CHEST 10/03/2024 07:50:48 PM COMPARISON: None available. CLINICAL HISTORY: Shortness of breath. FINDINGS: LUNGS AND PLEURA: No focal pulmonary opacity. No pleural effusion. No pneumothorax. HEART AND MEDIASTINUM: No acute abnormality of the cardiac and mediastinal silhouettes. BONES AND SOFT TISSUES: No acute osseous abnormality. IMPRESSION: 1. No acute cardiopulmonary process. Electronically signed by: Oneil Devonshire MD 10/03/2024 07:54 PM EST RP Workstation: [id number]   Scheduled Meds:  Chlorhexidine  Gluconate Cloth  6 each Topical Q0600   enoxaparin  (LOVENOX ) injection  40 mg Subcutaneous Q24H   fluticasone  furoate-vilanterol  1 puff Inhalation Daily   ipratropium-albuterol   3 mL Nebulization Q6H   methocarbamol   500 mg Oral TID   methylPREDNISolone  (SOLU-MEDROL ) injection  40 mg Intravenous Q12H   nicotine   21 mg Transdermal Daily   pantoprazole   40 mg Oral Daily   sodium chloride  flush  3 mL Intravenous Q12H   sodium chloride  flush  3 mL Intravenous Q12H   Continuous Infusions:    LOS: 1 day   Rendall Carwin M.D on 10/05/2024 at 4:48 PM  Go to [email or web address] - for contact info  [facility] - Office  [phone]  If 7PM-7AM, please contact night-coverage [email or web address] 10/05/2024, 4:48 PM    "

## 2024-10-06 LAB — GLUCOSE, CAPILLARY
Glucose-Capillary: 117 mg/dL — ABNORMAL HIGH (ref 70–99)
Glucose-Capillary: 118 mg/dL — ABNORMAL HIGH (ref 70–99)
Glucose-Capillary: 132 mg/dL — ABNORMAL HIGH (ref 70–99)

## 2024-10-06 MED ORDER — TRAZODONE HCL 50 MG PO TABS: 50.0000 mg | ORAL_TABLET | Freq: Every evening | ORAL | 2 refills | Status: AC | PRN

## 2024-10-06 MED ORDER — PANTOPRAZOLE SODIUM 40 MG PO TBEC: 40.0000 mg | DELAYED_RELEASE_TABLET | Freq: Every day | ORAL | 3 refills | Status: AC

## 2024-10-06 MED ORDER — PREDNISONE 20 MG PO TABS: 40.0000 mg | ORAL_TABLET | Freq: Every day | ORAL | 0 refills | Status: AC

## 2024-10-06 MED ORDER — NICOTINE 21 MG/24HR TD PT24: 21.0000 mg | MEDICATED_PATCH | TRANSDERMAL | 0 refills | Status: AC

## 2024-10-06 MED ORDER — AZITHROMYCIN 500 MG PO TABS: 500.0000 mg | ORAL_TABLET | Freq: Every day | ORAL | 0 refills | Status: AC

## 2024-10-06 MED ORDER — ACETAMINOPHEN 325 MG PO TABS: 650.0000 mg | ORAL_TABLET | Freq: Four times a day (QID) | ORAL | Status: AC | PRN

## 2024-10-06 MED ORDER — BUSPIRONE HCL 10 MG PO TABS: 10.0000 mg | ORAL_TABLET | Freq: Three times a day (TID) | ORAL | 3 refills | Status: AC

## 2024-10-06 MED ORDER — BREZTRI AEROSPHERE 160-9-4.8 MCG/ACT IN AERO: 2.0000 | INHALATION_SPRAY | Freq: Two times a day (BID) | RESPIRATORY_TRACT | 8 refills | Status: AC

## 2024-10-06 MED ORDER — ALBUTEROL SULFATE HFA 108 (90 BASE) MCG/ACT IN AERS: 2.0000 | INHALATION_SPRAY | Freq: Four times a day (QID) | RESPIRATORY_TRACT | 3 refills | Status: AC | PRN

## 2024-10-06 MED ORDER — ALBUTEROL SULFATE (2.5 MG/3ML) 0.083% IN NEBU: 2.5000 mg | INHALATION_SOLUTION | RESPIRATORY_TRACT | 1 refills | Status: AC | PRN

## 2024-10-06 NOTE — Discharge Summary (Signed)
 "                                                                                 Sharon Nash, is a 29 y.o. female  DOB 1996-01-22  MRN [id number].  Admission date:  10/03/2024  Admitting Physician  Posey Maier, DO  Discharge Date:  10/06/2024   Primary MD  Patient, No Pcp Per  Recommendations for primary care physician for things to follow:  1)Complete Abstinence from Tobacco advised--- okay to use over-the-counter nicotine  patch to help you quit smoking 2)Class II obesity- -Low calorie diet, portion control and increase physical activity discussed with patient -Body mass index is 35.94 kg/m.  Admission Diagnosis  Wheezing [R06.2] Moderate persistent asthma with exacerbation [J45.41] Moderate persistent asthma with status asthmaticus [J45.42] Acute respiratory failure with hypoxia (HCC) [J96.01] Asthma exacerbation [J45.901]  Discharge Diagnosis  Wheezing [R06.2] Moderate persistent asthma with exacerbation [J45.41] Moderate persistent asthma with status asthmaticus [J45.42] Acute respiratory failure with hypoxia (HCC) [J96.01] Asthma exacerbation [J45.901]    Principal Problem:   Asthma exacerbation Active Problems:   Obesity (BMI 30-39.9)   Gastroesophageal reflux disease without esophagitis      Past Medical History:  Diagnosis Date   Asthma    Chronic abdominal pain    Chronic knee pain    Respiratory failure, acute (HCC) 06/09/2012   bipap only, not intubated, due to asthma exacerbation    Past Surgical History:  Procedure Laterality Date   ANTERIOR CERVICAL DECOMP/DISCECTOMY FUSION N/A 07/16/2024   Procedure: ANTERIOR CERVICAL DECOMPRESSION/DISCECTOMY FUSION CERVICAL SIX-SEVEN;  Surgeon: Gillie Duncans, MD;  Location: [facility] OR;  Service: Neurosurgery;  Laterality: N/A;  ACDF C6-7   NO PAST SURGERIES     None       HPI  from the history and physical done on the day of admission:   Chief Complaint: Asthma Exacerbation/Respiratory distress   HPI: Sharon Nash is a 29 y.o. female with medical history significant for asthma, chronic abdominal pain, previous intubation (many years ago per patient).   She states that cold exacerbates her asthma, and she feels that this was the trigger on this occasion. However she has been inside mostly and during the onset of this episode. Onset was sudden and her attempts to control it were not effective.   ED Course: Patient was given multiple albuterol  treatments of varying lengths from 1-4 hour continuous ( while with EMS and in ER), Solu-Medrol , and Magnesium  IV also given. Patient is currently on BiPAP in the ER resting with some slight residual wheeze.   Review of Systems: Review of systems as noted in the HPI. All other systems reviewed and are negative.   Hospital Course:    Assessment and Plan: Brief Narrative:  29 year old female with history of mild persistent asthma (remote history of prior intubation many years ago), ongoing tobacco use and obesity admitted on 10/04/2024 with asthma flare up -- Required BiPAP and oxygen  supplementation initially    -Assessment and Plan: 1)Acute Asthma exacerbation--- required BiPAP initially Cough and wheezing significantly improved - Tachycardia , hypoxia and Tachypnea -resolved -Patient is a smoker, cannot exclude some component of bronchitis --Treated with IV Solu-Medrol  bronchodilators and  mucolytics --Clinically much improved hypoxia has resolved -Okay to discharge home on p.o. prednisone , azithromycin  bronchodilators and mucolytics    2)Acute hypoxic respiratory failure--- due to #1 above -Resolved as noted above ---Post ambulation O2 sats 95 to 97% on room air - Management as above #1   3)Tobacco Abuse--smoking cessation advised - Use nicotine  patch   4)Class 1 Obesity- -Low calorie diet, portion control and increase physical activity discussed with patient -Body mass index is 35.94 kg/m.   5)GERD--- Protonix  as ordered especially  while on high-dose steroids  6) mild anemia--- stable and largely asymptomatic at this time -No bleeding concerns   Disposition: The patient is from: Home              Anticipated d/c is to: Home  Discharge Condition: Stable without hypoxia  Follow UP--- with PCP as advised   Diet and Activity recommendation:  As advised  Discharge Instructions    Discharge Instructions     Call MD for:  persistant dizziness or light-headedness   Complete by: As directed    Call MD for:  persistant nausea and vomiting   Complete by: As directed    Call MD for:  temperature >100.4   Complete by: As directed    Diet general   Complete by: As directed    Discharge instructions   Complete by: As directed    1)Complete Abstinence from Tobacco advised--- okay to use over-the-counter nicotine  patch to help you quit smoking 2)Class II obesity- -Low calorie diet, portion control and increase physical activity discussed with patient -Body mass index is 35.94 kg/m.   Increase activity slowly   Complete by: As directed          Discharge Medications     Allergies as of 10/06/2024       Reactions   Shrimp [shellfish Allergy] Anaphylaxis   Sertraline  Other (See Comments)   Having bad dreams        Medication List     STOP taking these medications    budesonide -formoterol  160-4.5 MCG/ACT inhaler Commonly known as: Symbicort    methocarbamol  500 MG tablet Commonly known as: ROBAXIN        TAKE these medications    acetaminophen  325 MG tablet Commonly known as: TYLENOL  Take 2 tablets (650 mg total) by mouth every 6 (six) hours as needed for mild pain (pain score 1-3) or headache. What changed:  medication strength how much to take   albuterol  (2.5 MG/3ML) 0.083% nebulizer solution Commonly known as: PROVENTIL  Take 3 mLs (2.5 mg total) by nebulization every 4 (four) hours as needed for wheezing or shortness of breath. What changed: when to take this   albuterol  108 (90  Base) MCG/ACT inhaler Commonly known as: VENTOLIN  HFA Inhale 2 puffs into the lungs every 6 (six) hours as needed for wheezing or shortness of breath. What changed: reasons to take this   azithromycin  500 MG tablet Commonly known as: ZITHROMAX  Take 1 tablet (500 mg total) by mouth daily for 3 days.   Breztri  Aerosphere 160-9-4.8 MCG/ACT Aero inhaler Generic drug: budesonide -glycopyrrolate -formoterol  Inhale 2 puffs into the lungs in the morning and at bedtime.   busPIRone  10 MG tablet Commonly known as: BUSPAR  Take 1 tablet (10 mg total) by mouth 3 (three) times daily. For Anxiety   cetirizine  10 MG tablet Commonly known as: ZYRTEC  Take 10 mg by mouth daily as needed for allergies or rhinitis.   Cholecalciferol  25 MCG (1000 UT) capsule Take 1,000 Units by mouth in the  morning.   fluticasone  50 MCG/ACT nasal spray Commonly known as: FLONASE  Place 2 sprays into both nostrils daily.   HAIR SKIN & NAILS GUMMIES PO Take 2 each by mouth in the morning. Chew two gummies by mouth daily in the morning   HYDROcodone -acetaminophen  5-325 MG tablet Commonly known as: NORCO/VICODIN Take 1 tablet by mouth every 6 (six) hours as needed for severe pain (pain score 7-10).   Icy Hot 10-30 % Stck Apply 1 Application topically as needed (joint and muscle pain).   Icy Hot Lidocaine  Plus Menthol  4-1 % Ptch Generic drug: Lidocaine -Menthol  Apply 1-3 patches topically as needed (muscle and joint paint).   Nexplanon  68 MG Impl implant Generic drug: etonogestrel  1 each by Subdermal route once.   nicotine  21 mg/24hr patch Commonly known as: NICODERM CQ  - dosed in mg/24 hours Place 1 patch (21 mg total) onto the skin daily for 28 days.   One-A-Day Womens Formula Tabs Take 1 tablet by mouth in the morning.   pantoprazole  40 MG tablet Commonly known as: PROTONIX  Take 1 tablet (40 mg total) by mouth daily. Start taking on: October 07, 2024   predniSONE  20 MG tablet Commonly known as:  DELTASONE  Take 2 tablets (40 mg total) by mouth daily with breakfast for 5 days.   Tiger Balm Extra Strength 11-10 % Oint Apply 1 Application topically daily as needed (joint and muscle pain).   tiZANidine  4 MG tablet Commonly known as: Zanaflex  Take 1 tablet (4 mg total) by mouth every 8 (eight) hours as needed for muscle spasms. Do not take with alcohol or while driving or operating heavy machinery.  May cause drowsiness.   traZODone  50 MG tablet Commonly known as: DESYREL  Take 1 tablet (50 mg total) by mouth at bedtime as needed for sleep.       Major procedures and Radiology Reports - PLEASE review detailed and final reports for all details, in brief -   DG Chest Port 1 View Result Date: 10/03/2024 EXAM: 1 VIEW(S) XRAY OF THE CHEST 10/03/2024 07:50:48 PM COMPARISON: None available. CLINICAL HISTORY: Shortness of breath. FINDINGS: LUNGS AND PLEURA: No focal pulmonary opacity. No pleural effusion. No pneumothorax. HEART AND MEDIASTINUM: No acute abnormality of the cardiac and mediastinal silhouettes. BONES AND SOFT TISSUES: No acute osseous abnormality. IMPRESSION: 1. No acute cardiopulmonary process. Electronically signed by: Oneil Devonshire MD 10/03/2024 07:54 PM EST RP Workstation: [id number]   DG Thoracic Spine 2 View Result Date: 09/23/2024 CLINICAL DATA:  Back pain. EXAM: THORACIC SPINE 2 VIEWS COMPARISON:  Radiograph dated 04/12/2010. FINDINGS: There is no evidence of thoracic spine fracture. Alignment is normal. No other significant bone abnormalities are identified. IMPRESSION: Negative. Electronically Signed   By: Vanetta Chou M.D.   On: 09/23/2024 14:46   DG Finger Thumb Right Result Date: 09/23/2024 CLINICAL DATA:  Thumb pain. EXAM: RIGHT THUMB 2+V COMPARISON:  None Available. FINDINGS: There is no evidence of fracture or dislocation. There is no evidence of arthropathy or other focal bone abnormality. Soft tissues are unremarkable. IMPRESSION: Negative. Electronically Signed    By: Vanetta Chou M.D.   On: 09/23/2024 14:45    Micro Results  Recent Results (from the past 240 hours)  Resp panel by RT-PCR (RSV, Flu A&B, Covid) Anterior Nasal Swab     Status: None   Collection Time: 10/03/24  7:20 PM   Specimen: Anterior Nasal Swab  Result Value Ref Range Status   SARS Coronavirus 2 by RT PCR NEGATIVE NEGATIVE Final    Comment: (  NOTE) SARS-CoV-2 target nucleic acids are NOT DETECTED.  The SARS-CoV-2 RNA is generally detectable in upper respiratory specimens during the acute phase of infection. The lowest concentration of SARS-CoV-2 viral copies this assay can detect is 138 copies/mL. A negative result does not preclude SARS-Cov-2 infection and should not be used as the sole basis for treatment or other patient management decisions. A negative result may occur with  improper specimen collection/handling, submission of specimen other than nasopharyngeal swab, presence of viral mutation(s) within the areas targeted by this assay, and inadequate number of viral copies(<138 copies/mL). A negative result must be combined with clinical observations, patient history, and epidemiological information. The expected result is Negative.  Fact Sheet for Patients:  bloggercourse.com  Fact Sheet for Healthcare Providers:  seriousbroker.it  This test is no t yet approved or cleared by the [place]  FDA and  has been authorized for detection and/or diagnosis of SARS-CoV-2 by FDA under an Emergency Use Authorization (EUA). This EUA will remain  in effect (meaning this test can be used) for the duration of the COVID-19 declaration under Section 564(b)(1) of the Act, 21 U.S.C.section 360bbb-3(b)(1), unless the authorization is terminated  or revoked sooner.       Influenza A by PCR NEGATIVE NEGATIVE Final   Influenza B by PCR NEGATIVE NEGATIVE Final    Comment: (NOTE) The Xpert Xpress SARS-CoV-2/FLU/RSV plus assay  is intended as an aid in the diagnosis of influenza from Nasopharyngeal swab specimens and should not be used as a sole basis for treatment. Nasal washings and aspirates are unacceptable for Xpert Xpress SARS-CoV-2/FLU/RSV testing.  Fact Sheet for Patients: bloggercourse.com  Fact Sheet for Healthcare Providers: seriousbroker.it  This test is not yet approved or cleared by the [place]  FDA and has been authorized for detection and/or diagnosis of SARS-CoV-2 by FDA under an Emergency Use Authorization (EUA). This EUA will remain in effect (meaning this test can be used) for the duration of the COVID-19 declaration under Section 564(b)(1) of the Act, 21 U.S.C. section 360bbb-3(b)(1), unless the authorization is terminated or revoked.     Resp Syncytial Virus by PCR NEGATIVE NEGATIVE Final    Comment: (NOTE) Fact Sheet for Patients: bloggercourse.com  Fact Sheet for Healthcare Providers: seriousbroker.it  This test is not yet approved or cleared by the [place]  FDA and has been authorized for detection and/or diagnosis of SARS-CoV-2 by FDA under an Emergency Use Authorization (EUA). This EUA will remain in effect (meaning this test can be used) for the duration of the COVID-19 declaration under Section 564(b)(1) of the Act, 21 U.S.C. section 360bbb-3(b)(1), unless the authorization is terminated or revoked.  Performed at [facility], [address], [place], [place] [address]   MRSA Next Gen by PCR, Nasal     Status: None   Collection Time: 10/04/24  6:04 PM   Specimen: Nasal Mucosa; Nasal Swab  Result Value Ref Range Status   MRSA by PCR Next Gen NOT DETECTED NOT DETECTED Final    Comment: (NOTE) The GeneXpert MRSA Assay (FDA approved for NASAL specimens only), is one component of a comprehensive MRSA colonization surveillance program. It is not intended to diagnose  MRSA infection nor to guide or monitor treatment for MRSA infections. Test performance is not FDA approved in patients less than 20 years old. Performed at [facility], [address] [place], [place] [address]     Today   Subjective    Celisa Schoenberg today has no new complaints No fever  Or  chills   No Nausea, Vomiting or Diarrhea - Overall much improved from a respiratory standpoint -Post ambulation O2 sats 95 to 97% on room air at this time  Patient has been seen and examined prior to discharge   Objective   Blood pressure 115/66, pulse 85, temperature 98.5 F (36.9 C), temperature source Oral, resp. rate 18, height 5' 7 (1.702 m), weight 104.1 kg, last menstrual period 09/23/2024, SpO2 95%.   Intake/Output Summary (Last 24 hours) at 10/06/2024 1248 Last data filed at 10/06/2024 9178 Gross per 24 hour  Intake 480 ml  Output --  Net 480 ml    Exam Gen:- Awake Alert, no acute distress, no conversational dyspnea HEENT:- [place].AT, No sclera icterus Neck-Supple Neck,No JVD,.  Lungs-  -improved air movement, no wheezing CV- S1, S2 normal, regular Abd-  +ve B.Sounds, Abd Soft, No tenderness, slightly more increased truncal adiposity Extremity/Skin:- No  edema,   good pulses Psych-affect is appropriate, oriented x3 Neuro-no new focal deficits, no tremors    Data Review   CBC w Diff:  Lab Results  Component Value Date   WBC 9.0 10/03/2024   HGB 11.9 (L) 10/03/2024   HGB 10.4 (L) 06/29/2021   HCT 39.0 10/03/2024   HCT 32.4 (L) 06/29/2021   PLT 379 10/03/2024   PLT 335 06/29/2021   LYMPHOPCT 9 07/13/2024   MONOPCT 4 07/13/2024   EOSPCT 0 07/13/2024   BASOPCT 0 07/13/2024   CMP:  Lab Results  Component Value Date   NA 141 10/04/2024   K 3.9 10/04/2024   CL 104 10/04/2024   CO2 18 (L) 10/04/2024   BUN 9 10/04/2024   CREATININE 0.78 10/04/2024   PROT 7.6 10/04/2024   ALBUMIN 4.2 10/04/2024   BILITOT 0.3 10/04/2024   ALKPHOS 79 10/04/2024   AST 20  10/04/2024   ALT 6 10/04/2024   Total Discharge time is about 33 minutes  Rendall Carwin M.D on 10/06/2024 at 12:48 PM  Go to [email or web address] -  for contact info  [facility] - Office  [phone]   "

## 2024-10-06 NOTE — Plan of Care (Signed)
°  Problem: Education: Goal: Knowledge of General Education information will improve Description: Including pain rating scale, medication(s)/side effects and non-pharmacologic comfort measures Outcome: Progressing   Problem: Health Behavior/Discharge Planning: Goal: Ability to manage health-related needs will improve Outcome: Progressing   Problem: Clinical Measurements: Goal: Ability to maintain clinical measurements within normal limits will improve Outcome: Progressing Goal: Diagnostic test results will improve Outcome: Progressing Goal: Respiratory complications will improve Outcome: Progressing   Problem: Coping: Goal: Level of anxiety will decrease Outcome: Progressing   Problem: Safety: Goal: Ability to remain free from injury will improve Outcome: Progressing
